# Patient Record
Sex: Female | Born: 1975 | Race: White | Hispanic: No | Marital: Married | Smoking: Never smoker
Health system: Southern US, Community
[De-identification: ages and names within clinical notes are randomized; demographics above are authoritative.]

## PROBLEM LIST (undated history)

## (undated) DIAGNOSIS — F419 Anxiety disorder, unspecified: Secondary | ICD-10-CM

## (undated) DIAGNOSIS — I10 Essential (primary) hypertension: Secondary | ICD-10-CM

## (undated) DIAGNOSIS — J45909 Unspecified asthma, uncomplicated: Secondary | ICD-10-CM

## (undated) DIAGNOSIS — M1990 Unspecified osteoarthritis, unspecified site: Secondary | ICD-10-CM

## (undated) DIAGNOSIS — F259 Schizoaffective disorder, unspecified: Secondary | ICD-10-CM

## (undated) DIAGNOSIS — F32A Depression, unspecified: Secondary | ICD-10-CM

## (undated) DIAGNOSIS — R011 Cardiac murmur, unspecified: Secondary | ICD-10-CM

## (undated) DIAGNOSIS — F4310 Post-traumatic stress disorder, unspecified: Secondary | ICD-10-CM

## (undated) DIAGNOSIS — M5430 Sciatica, unspecified side: Secondary | ICD-10-CM

## (undated) DIAGNOSIS — G932 Benign intracranial hypertension: Secondary | ICD-10-CM

## (undated) HISTORY — PX: ABDOMINAL HYSTERECTOMY: SHX81

## (undated) HISTORY — PX: TONSILLECTOMY: SUR1361

## (undated) HISTORY — PX: KNEE SURGERY: SHX244

## (undated) HISTORY — PX: APPENDECTOMY: SHX54

## (undated) HISTORY — PX: ABDOMINAL SURGERY: SHX537

## (undated) HISTORY — PX: TUBAL LIGATION: SHX77

## (undated) HISTORY — PX: CHOLECYSTECTOMY: SHX55

## (undated) HISTORY — PX: CARDIAC CATHETERIZATION: SHX172

---

## 2014-07-26 DIAGNOSIS — F419 Anxiety disorder, unspecified: Secondary | ICD-10-CM | POA: Diagnosis present

## 2014-09-11 DIAGNOSIS — G43719 Chronic migraine without aura, intractable, without status migrainosus: Secondary | ICD-10-CM | POA: Insufficient documentation

## 2014-09-11 DIAGNOSIS — J189 Pneumonia, unspecified organism: Secondary | ICD-10-CM | POA: Insufficient documentation

## 2014-09-11 DIAGNOSIS — M5440 Lumbago with sciatica, unspecified side: Secondary | ICD-10-CM | POA: Insufficient documentation

## 2014-09-11 DIAGNOSIS — M25569 Pain in unspecified knee: Secondary | ICD-10-CM | POA: Insufficient documentation

## 2014-09-11 DIAGNOSIS — G932 Benign intracranial hypertension: Secondary | ICD-10-CM | POA: Insufficient documentation

## 2014-09-11 DIAGNOSIS — L259 Unspecified contact dermatitis, unspecified cause: Secondary | ICD-10-CM | POA: Insufficient documentation

## 2014-12-25 DIAGNOSIS — G894 Chronic pain syndrome: Secondary | ICD-10-CM | POA: Diagnosis present

## 2015-12-04 DIAGNOSIS — R42 Dizziness and giddiness: Secondary | ICD-10-CM | POA: Insufficient documentation

## 2015-12-04 DIAGNOSIS — M436 Torticollis: Secondary | ICD-10-CM | POA: Insufficient documentation

## 2015-12-04 DIAGNOSIS — H53133 Sudden visual loss, bilateral: Secondary | ICD-10-CM | POA: Insufficient documentation

## 2015-12-04 DIAGNOSIS — R110 Nausea: Secondary | ICD-10-CM | POA: Insufficient documentation

## 2017-05-03 DIAGNOSIS — G4700 Insomnia, unspecified: Secondary | ICD-10-CM | POA: Insufficient documentation

## 2017-05-03 DIAGNOSIS — M62830 Muscle spasm of back: Secondary | ICD-10-CM | POA: Insufficient documentation

## 2017-05-03 DIAGNOSIS — J45909 Unspecified asthma, uncomplicated: Secondary | ICD-10-CM | POA: Insufficient documentation

## 2018-08-01 DIAGNOSIS — I10 Essential (primary) hypertension: Secondary | ICD-10-CM | POA: Diagnosis present

## 2018-08-01 DIAGNOSIS — F3341 Major depressive disorder, recurrent, in partial remission: Secondary | ICD-10-CM | POA: Insufficient documentation

## 2018-08-01 DIAGNOSIS — I319 Disease of pericardium, unspecified: Secondary | ICD-10-CM | POA: Insufficient documentation

## 2020-11-29 ENCOUNTER — Emergency Department (HOSPITAL_COMMUNITY): Payer: BC Managed Care – PPO

## 2020-11-29 ENCOUNTER — Encounter (HOSPITAL_COMMUNITY): Payer: Self-pay

## 2020-11-29 ENCOUNTER — Emergency Department (HOSPITAL_COMMUNITY)
Admission: EM | Admit: 2020-11-29 | Discharge: 2020-11-30 | Disposition: A | Discharge status: Home / Self Care | Payer: BC Managed Care – PPO | Attending: Emergency Medicine | Admitting: Emergency Medicine

## 2020-11-29 ENCOUNTER — Other Ambulatory Visit: Payer: Self-pay

## 2020-11-29 DIAGNOSIS — R112 Nausea with vomiting, unspecified: Secondary | ICD-10-CM | POA: Diagnosis not present

## 2020-11-29 DIAGNOSIS — R197 Diarrhea, unspecified: Secondary | ICD-10-CM | POA: Diagnosis not present

## 2020-11-29 DIAGNOSIS — G8929 Other chronic pain: Secondary | ICD-10-CM

## 2020-11-29 DIAGNOSIS — R509 Fever, unspecified: Secondary | ICD-10-CM | POA: Diagnosis not present

## 2020-11-29 DIAGNOSIS — R059 Cough, unspecified: Secondary | ICD-10-CM | POA: Insufficient documentation

## 2020-11-29 DIAGNOSIS — Z20822 Contact with and (suspected) exposure to covid-19: Secondary | ICD-10-CM | POA: Insufficient documentation

## 2020-11-29 DIAGNOSIS — R0789 Other chest pain: Secondary | ICD-10-CM | POA: Insufficient documentation

## 2020-11-29 DIAGNOSIS — M5442 Lumbago with sciatica, left side: Secondary | ICD-10-CM | POA: Diagnosis not present

## 2020-11-29 DIAGNOSIS — M5441 Lumbago with sciatica, right side: Secondary | ICD-10-CM | POA: Diagnosis not present

## 2020-11-29 DIAGNOSIS — R0602 Shortness of breath: Secondary | ICD-10-CM | POA: Insufficient documentation

## 2020-11-29 DIAGNOSIS — R350 Frequency of micturition: Secondary | ICD-10-CM | POA: Insufficient documentation

## 2020-11-29 DIAGNOSIS — I10 Essential (primary) hypertension: Secondary | ICD-10-CM | POA: Insufficient documentation

## 2020-11-29 DIAGNOSIS — R000 Tachycardia, unspecified: Secondary | ICD-10-CM | POA: Insufficient documentation

## 2020-11-29 DIAGNOSIS — M545 Low back pain, unspecified: Secondary | ICD-10-CM | POA: Diagnosis present

## 2020-11-29 DIAGNOSIS — R6889 Other general symptoms and signs: Secondary | ICD-10-CM

## 2020-11-29 DIAGNOSIS — H538 Other visual disturbances: Secondary | ICD-10-CM | POA: Insufficient documentation

## 2020-11-29 DIAGNOSIS — R1310 Dysphagia, unspecified: Secondary | ICD-10-CM | POA: Insufficient documentation

## 2020-11-29 HISTORY — DX: Cardiac murmur, unspecified: R01.1

## 2020-11-29 HISTORY — DX: Benign intracranial hypertension: G93.2

## 2020-11-29 HISTORY — DX: Unspecified osteoarthritis, unspecified site: M19.90

## 2020-11-29 HISTORY — DX: Sciatica, unspecified side: M54.30

## 2020-11-29 LAB — RESP PANEL BY RT-PCR (FLU A&B, COVID) ARPGX2
Influenza A by PCR: NEGATIVE
Influenza B by PCR: NEGATIVE
SARS Coronavirus 2 by RT PCR: NEGATIVE

## 2020-11-29 MED ORDER — ONDANSETRON HCL 4 MG/2ML IJ SOLN
4.0000 mg | Freq: Once | INTRAMUSCULAR | Status: AC
  Administered 2020-11-30: 4 mg via INTRAVENOUS
  Filled 2020-11-29: qty 2

## 2020-11-29 MED ORDER — MORPHINE SULFATE (PF) 4 MG/ML IV SOLN
4.0000 mg | Freq: Once | INTRAVENOUS | Status: AC
  Administered 2020-11-30: 4 mg via INTRAVENOUS
  Filled 2020-11-29: qty 1

## 2020-11-29 NOTE — ED Provider Notes (Signed)
[facility]-EMERGENCY DEPT Provider Note   CSN: [id number] Arrival date & time: 11/29/20  1853     History Chief Complaint  Patient presents with   Blurred Vision   multiple complaints    Audrey [name] is a 45 y.o. female with a history of pseudotumor cerebri, with a history of degenerative disc disease, sciatica, heart murmur, and chronic pain syndrome on chronic with chronic opioid use who presents the emergency department by EMS with a chief complaint of shortness of breath.  The patient reports a 5-day history of worsening shortness of breath that has been constant, but waxes and wanes in intensity, accompanied by nonproductive cough, constant, chest pain that is pressure-like and radiates around her right ribs into her right back, neck, and right shoulder.  She also adds that she has been having a fever and chills for the last 3 to 4 days.  T-max 103.5.  She has been taking Tylenol at home, last dose at 22:00.   She also adds that for the last 3 days that she has developed dysphagia.  Reports that she is having choking episodes with almost any solid foods.  She can minimally tolerate liquids.  She denies having a sore throat or odynophagia.  In spite of this, she adds that she has also been having urinary frequency, nausea, vomiting, and diarrhea over the last few days.  She also adds that her vision has been blurry over the last few days, right eye greater than left.  No diplopia, amaurosis fugax.   She also adds that she has been having increasing low back pain.  States that this has been worsening over the last month.  She reports pain radiating down her bilateral legs.  She states that she cannot feel her bilateral feet.  Over the last month she has had increasing episodes of urinary incontinence and has had to start wearing a diaper.  She denies fecal incontinence, saddle paresthesias.  She also feels as if her legs are more weak and have started giving out on  her.  She has had multiple falls over the last few days, but denies hitting her head or having a loss of consciousness.  She states that in addition to the worsening shortness of breath and chest pain, that the increasing falls is also what prompted her visit to the ED today.  She was followed by a pain specialist in [place] and has an appointment with pain management and [place], but the appointment is not for several weeks.  Unfortunately, she ran out of her home morphine 4 days ago.  She also reports that her daily medications include tizanidine and Valium.  States that she was only given a 1 month supply prior to moving to [place].  She denies rash, abdominal pain, neck stiffness, headache muffled voice, ear pain, leg swelling, hematuria, vaginal bleeding, discharge, or pain, rectal pain, constipation, palpitations.  States that she has a history of 3 natural back fusions.  No history of surgery to the lumbar spine.  She does have a history of back injections, but last injection was more than 3 years ago.  Surgical history includes tubal ligation, hysterectomy, appendectomy, cholecystectomy.  She also adds that she has a history of cutting, which is chronic.  No personal history of cancer.  She denies alcohol use, tobacco use, and all illicit or recreational substance use.  The history is provided by the patient and medical records. No language interpreter was used.  Past Medical History:  Diagnosis Date   Arthritis    Heart murmur    Pseudotumor cerebri    Sciatica     There are no problems to display for this patient.   Past Surgical History:  Procedure Laterality Date   ABDOMINAL HYSTERECTOMY     ABDOMINAL SURGERY     APPENDECTOMY     CARDIAC CATHETERIZATION     CHOLECYSTECTOMY     KNEE SURGERY     TONSILLECTOMY     TUBAL LIGATION       OB History   No obstetric history on file.     History reviewed. No pertinent family history.  Social History    Tobacco Use   Smoking status: Never   Smokeless tobacco: Never  Vaping Use   Vaping Use: Never used  Substance Use Topics   Alcohol use: Never   Drug use: Never    Home Medications Prior to Admission medications   Medication Sig Start Date End Date Taking? Authorizing Provider  diazepam (VALIUM) 5 MG tablet Take 1 tablet (5 mg total) by mouth every 12 (twelve) hours as needed for up to 3 days for anxiety. 11/30/20 12/03/20 Yes Tyger Oka A, PA-C  Lidocaine (HM LIDOCAINE PATCH) 4 % PTCH Apply 1 patch topically daily. 11/30/20  Yes Ayanni Tun A, PA-C  tiZANidine (ZANAFLEX) 4 MG tablet Take 1 tablet (4 mg total) by mouth every 12 (twelve) hours as needed for muscle spasms. 11/30/20  Yes Patrisha Hausmann A, PA-C    Allergies    Compazine [prochlorperazine], Nitroglycerin er, and Penicillins  Review of Systems   Review of Systems  Constitutional:  Positive for chills and fever. Negative for activity change.  HENT:  Positive for voice change. Negative for congestion, rhinorrhea, sneezing, sore throat and tinnitus.   Eyes:  Positive for visual disturbance (bilateral blurred vision R>L).  Respiratory:  Positive for cough, choking and shortness of breath.   Cardiovascular:  Positive for chest pain. Negative for palpitations.  Gastrointestinal:  Positive for abdominal pain, diarrhea, nausea and vomiting. Negative for constipation.  Endocrine: Positive for polyuria.  Genitourinary:  Positive for frequency. Negative for dysuria, flank pain, vaginal bleeding, vaginal discharge and vaginal pain.  Musculoskeletal:  Positive for arthralgias, back pain, gait problem and myalgias. Negative for neck pain and neck stiffness.  Skin:  Negative for color change, rash and wound.  Allergic/Immunologic: Negative for immunocompromised state.  Neurological:  Positive for weakness and numbness. Negative for syncope, light-headedness and headaches.  Psychiatric/Behavioral:  Negative for confusion.    Physical  Exam Updated Vital Signs BP (!) 170/91 (BP Location: Left Arm)   Pulse (!) 107   Temp 98 F (36.7 C) (Oral)   Resp (!) 25   Ht 5\' 3"  (1.6 m)   Wt 124.7 kg   SpO2 98%   BMI 48.71 kg/m   Physical Exam Vitals and nursing note reviewed.  Constitutional:      General: She is not in acute distress.    Appearance: She is obese. She is not ill-appearing, toxic-appearing or diaphoretic.  HENT:     Head: Normocephalic.     Mouth/Throat:     Comments: Uvula is midline.  Phonation is normal. Eyes:     Conjunctiva/sclera: Conjunctivae normal.  Neck:     Comments: No swelling noted to the neck.  No meningismus. Cardiovascular:     Rate and Rhythm: Normal rate and regular rhythm.     Heart sounds: No murmur heard.   No  friction rub. No gallop.  Pulmonary:     Effort: Pulmonary effort is normal. No respiratory distress.     Breath sounds: No stridor. No wheezing, rhonchi or rales.     Comments: Lungs are clear Tatian bilaterally.  No increased work of breathing.  Patient is able to speak in complete, fluent sentences. Chest:     Chest wall: No tenderness.  Abdominal:     General: There is no distension.     Palpations: Abdomen is soft. There is no mass.     Tenderness: There is abdominal tenderness. There is no right CVA tenderness, left CVA tenderness, guarding or rebound.     Hernia: No hernia is present.     Comments: Minimal tenderness to palpation in the bilateral lower abdomen without rebound or guarding.   Musculoskeletal:     Cervical back: Neck supple.     Right lower leg: No edema.     Left lower leg: No edema.     Comments: Tender to palpation to the midline spinous processes of the lumbar spine.  No crepitus or step-offs.  There is bilateral paraspinal muscle tenderness.  Negative straight leg raise bilaterally.  Good strength against resistance of the bilateral lower extremities.  Patient is able to lift her legs independently off the bed with minimal difficulty.  Sensation  is intact and equal to the bilateral lower extremities.  DP and PT pulses are 2+ and symmetric.  Skin:    General: Skin is warm.     Findings: No rash.  Neurological:     Mental Status: She is alert.  Psychiatric:        Behavior: Behavior normal.    ED Results / Procedures / Treatments   Labs (all labs ordered are listed, but only abnormal results are displayed) Labs Reviewed  COMPREHENSIVE METABOLIC PANEL - Abnormal; Notable for the following components:      Result Value   Calcium 8.7 (*)    Total Protein 6.1 (*)    Albumin 3.3 (*)    All other components within normal limits  RESP PANEL BY RT-PCR (FLU A&B, COVID) ARPGX2  GROUP A STREP BY PCR  CBC WITH DIFFERENTIAL/PLATELET  LIPASE, BLOOD  LACTIC ACID, PLASMA  URINALYSIS, ROUTINE W REFLEX MICROSCOPIC  BRAIN NATRIURETIC PEPTIDE  TROPONIN I (HIGH SENSITIVITY)    EKG EKG Interpretation  Date/Time:  Friday November 29 2020 19:08:06 EDT Ventricular Rate:  107 PR Interval:  161 QRS Duration: 94 QT Interval:  360 QTC Calculation: 481 R Axis:   94 Text Interpretation: Sinus tachycardia Borderline right axis deviation Low voltage, precordial leads Abnormal T, consider ischemia, anterior leads No old tracing to compare Confirmed by Melene Plan [phone]) on 11/29/2020 10:41:22 PM  Radiology DG Chest 2 View  Result Date: 11/29/2020 CLINICAL DATA:  Shortness of breath EXAM: CHEST - 2 VIEW COMPARISON:  None. FINDINGS: Borderline enlarged cardiac silhouette. There is no focal airspace disease. There is no large pleural effusion or visible pneumothorax. Incidental azygous fissure, normal variant. There is no acute osseous abnormality. IMPRESSION: Borderline enlarged cardiac silhouette.  No focal airspace disease. Electronically Signed   By: Caprice Renshaw M.D.   On: 11/29/2020 20:02    Procedures Procedures   Medications Ordered in ED Medications  morphine 4 MG/ML injection 4 mg (4 mg Intravenous Given 11/30/20 0130)  ondansetron  (ZOFRAN) injection 4 mg (4 mg Intravenous Given 11/30/20 0129)    ED Course  I have reviewed the triage vital signs and the nursing  notes.  Pertinent labs & imaging results that were available during my care of the patient were reviewed by me and considered in my medical decision making (see chart for details).  11/08/2020 11/07/2020 2  Diazepam 5 Mg Tablet 30.00 30 Bi Kol [id number] Pub (2387) 0/0 0.50 LME [place] Ins [place] 11/03/2020 11/02/2020 2  Morphine Sulfate Ir 15 Mg Tab 2.00 1 Malena Edman [id number] Pub (2387) 0/0 30.00 [place]ME [place] 10/07/2020 10/03/2020 2  Alprazolam 2 Mg Tablet 90.00 30 La Rob [id number] Pub (2387) 0/0 12.00 [place] 07/06/2020 06/26/2020 2  Alprazolam 2 Mg Tablet 90.00 30 La Rob [id number] Pub (2387) 0/0 12.00 [place] 02/26/2020 02/26/2020 2  Alprazolam 2 Mg Tablet 90.00 30 La Rob [id number] Pub (2387) 0/0 12.00 [place] 08/08/2019 08/02/2019 1  Buprenorphine 5 Mcg/hr Patch 4.00 28 Be Tor [id number] Pub (8072) 0/0 0.12 mg Comm Ins [facility] 02/26/2019 02/26/2019 1  Diazepam 10 Mg Tablet 21.00 10 Yi Ros [id number] Pub (9660) 0/0 2.10 LME [place]    MDM Rules/Calculators/A&P                            45 year old female with a history of pseudotumor cerebri, with a history of degenerative disc disease, sciatica, heart murmur, and chronic pain syndrome on chronic with chronic opioid use who presents to the emergency department with multiple complaints as mentioned above.  Mild tachycardia on arrival.  Mild hypertension, but she has no tachypnea or hypoxia.  Labs and imaging of been reviewed and independently interpreted by me.  Patient expressed concerns for worsening shortness of breath and chest pain.  Troponin is not elevated.  Chest x-ray is unremarkable.  EKG with mild sinus tachycardia.  BNP is not elevated.  Although she does have mild sinus tachycardia, no other risk factors for PE.  I did consider that this could be from withdrawal as she does report  that she ran out of her home morphine and Valium 4 days ago.  However, as the remainder of her work-up continued, I have a low suspicion for benzodiazepine or opioid withdrawal at this time.  She has no metabolic derangements.  Urinalysis is not concerning for infection.  Labs are otherwise overall reassuring.  She did express concern for acute on chronic low back pain.  Unfortunately, I am not able to review her medical records from [place] and care everywhere at this time.  On exam, she has no focal neurologic deficits.  She has a negative straight leg raise bilaterally.  She does not have any red flags other than urinary incontinence, which has been ongoing for weeks, per the patient, she has been ambulatory and was ambulated independently in the department on pulse ox and had no hypoxia.  Upon further review of the patient's chart, I reviewed the [place]ist[place] controlled substance database.  I pulled her records from [place], which are listed above.  Of note, the patient does not have any prescriptions for chronic morphine.  She was prescribed a 2 pill course previously.  She does however appear to take Valium regularly.  Given concern for abrupt discontinuation of benzodiazepine, I did have a very frank conversation with the patient's about discharging her with a short course of a controlled substance.  She is scheduled to be seen by pain management in 1 week, and I have discussed that this is a one-time prescription from the emergency department.  I did  consider ordering repeat imaging of her lumbar spine since we do not have images available at this time, but given her overall physical exam with no neurologic deficits, her ability to ambulate independently, and the fact that she has remained stable after being observed for almost 10 hours in the emergency department, I feel that no further urgent or emergent work-up is indicated.  Doubt cauda equina, transverse myelitis, epidural abscess.  At this time, the  patient is hemodynamically stable in no acute distress.  Urged the patient to get established with a primary care provider for follow-up. Final Clinical Impression(s) / ED Diagnoses Final diagnoses:  Chronic bilateral low back pain with bilateral sciatica  Multiple complaints    Rx / DC Orders ED Discharge Orders          Ordered    diazepam (VALIUM) 5 MG tablet  Every 12 hours PRN        11/30/20 0424    tiZANidine (ZANAFLEX) 4 MG tablet  Every 12 hours PRN        11/30/20 0424    Lidocaine (HM LIDOCAINE PATCH) 4 % PTCH  Every 24 hours        11/30/20 0424             Icy Fuhrmann A, PA-C 11/30/20 0523    Molpus, John, MD 11/30/20 1203

## 2020-11-29 NOTE — ED Triage Notes (Signed)
Per EMS, patient walked to [facility] and EMS was called from there. C/o SOB x4 days. Recent move from [place]. Endorses N/V and fever. Reports taking tylenol with relief.

## 2020-11-29 NOTE — ED Triage Notes (Signed)
Patient added that she has trouble swallowing and blurred vision that started today.  Also noted that the patient has superficial cuts to the left hand and left lower forearm area. Patient states she is a cutter and did this 4-5 months ago.

## 2020-11-29 NOTE — ED Provider Notes (Signed)
Emergency Medicine Provider Triage Evaluation Note  Audrey [name] , a 45 y.o. female  was evaluated in triage.  Pt complains of short of breath for 4 days.  Pt has multiple complaints    Review of Systems  Positive: Fever Negative:   Physical Exam  BP (!) 158/119 (BP Location: Left Arm)   Pulse (!) 114   Temp 98 F (36.7 C) (Oral)   Resp 17   Ht 5\' 3"  (1.6 m)   Wt 124.7 kg   SpO2 98%   BMI 48.71 kg/m  Gen:   Awake, no distress   Resp:  Normal effort  MSK:   Moves extremities without difficulty  Other:    Medical Decision Making  Medically screening exam initiated at 7:24 PM.  Appropriate orders placed.  Audrey [name] was informed that the remainder of the evaluation will be completed by another provider, this initial triage assessment does not replace that evaluation, and the importance of remaining in the ED until their evaluation is complete.     Pearlean Brownie, PA-C 11/29/20 1925    01/29/21, DO 11/29/20 2258

## 2020-11-30 LAB — URINALYSIS, ROUTINE W REFLEX MICROSCOPIC
Bilirubin Urine: NEGATIVE
Glucose, UA: NEGATIVE mg/dL
Hgb urine dipstick: NEGATIVE
Ketones, ur: NEGATIVE mg/dL
Leukocytes,Ua: NEGATIVE
Nitrite: NEGATIVE
Protein, ur: NEGATIVE mg/dL
Specific Gravity, Urine: 1.01 (ref 1.005–1.030)
pH: 8 (ref 5.0–8.0)

## 2020-11-30 LAB — COMPREHENSIVE METABOLIC PANEL
ALT: 19 U/L (ref 0–44)
AST: 28 U/L (ref 15–41)
Albumin: 3.3 g/dL — ABNORMAL LOW (ref 3.5–5.0)
Alkaline Phosphatase: 109 U/L (ref 38–126)
Anion gap: 5 (ref 5–15)
BUN: 7 mg/dL (ref 6–20)
CO2: 27 mmol/L (ref 22–32)
Calcium: 8.7 mg/dL — ABNORMAL LOW (ref 8.9–10.3)
Chloride: 108 mmol/L (ref 98–111)
Creatinine, Ser: 0.72 mg/dL (ref 0.44–1.00)
GFR, Estimated: >60 mL/min (ref >60)
Glucose, Bld: 94 mg/dL (ref 70–99)
Potassium: 4 mmol/L (ref 3.5–5.1)
Sodium: 140 mmol/L (ref 135–145)
Total Bilirubin: 0.8 mg/dL (ref 0.3–1.2)
Total Protein: 6.1 g/dL — ABNORMAL LOW (ref 6.5–8.1)

## 2020-11-30 LAB — TROPONIN I (HIGH SENSITIVITY): Troponin I (High Sensitivity): 3 ng/L (ref <18)

## 2020-11-30 LAB — GROUP A STREP BY PCR: Group A Strep by PCR: NOT DETECTED

## 2020-11-30 LAB — CBC WITH DIFFERENTIAL/PLATELET
Abs Immature Granulocytes: 0.05 10*3/uL (ref 0.00–0.07)
Basophils Absolute: 0.1 10*3/uL (ref 0.0–0.1)
Basophils Relative: 1 %
Eosinophils Absolute: 0.2 10*3/uL (ref 0.0–0.5)
Eosinophils Relative: 3 %
HCT: 39.2 % (ref 36.0–46.0)
Hemoglobin: 12.5 g/dL (ref 12.0–15.0)
Immature Granulocytes: 1 %
Lymphocytes Relative: 23 %
Lymphs Abs: 1.7 10*3/uL (ref 0.7–4.0)
MCH: 31.2 pg (ref 26.0–34.0)
MCHC: 31.9 g/dL (ref 30.0–36.0)
MCV: 97.8 fL (ref 80.0–100.0)
Monocytes Absolute: 0.4 10*3/uL (ref 0.1–1.0)
Monocytes Relative: 5 %
Neutro Abs: 5 10*3/uL (ref 1.7–7.7)
Neutrophils Relative %: 67 %
Platelets: 198 10*3/uL (ref 150–400)
RBC: 4.01 MIL/uL (ref 3.87–5.11)
RDW: 14.3 % (ref 11.5–15.5)
WBC: 7.5 10*3/uL (ref 4.0–10.5)
nRBC: 0 % (ref 0.0–0.2)

## 2020-11-30 LAB — LACTIC ACID, PLASMA: Lactic Acid, Venous: 1.1 mmol/L (ref 0.5–1.9)

## 2020-11-30 LAB — LIPASE, BLOOD: Lipase: 21 U/L (ref 11–51)

## 2020-11-30 LAB — BRAIN NATRIURETIC PEPTIDE: B Natriuretic Peptide: 45 pg/mL (ref 0.0–100.0)

## 2020-11-30 MED ORDER — DIAZEPAM 5 MG PO TABS: 5.0000 mg | ORAL_TABLET | Freq: Two times a day (BID) | ORAL | 0 refills | Status: AC | PRN

## 2020-11-30 MED ORDER — LIDOCAINE 4 % EX PTCH
1.0000 | MEDICATED_PATCH | CUTANEOUS | 0 refills | Status: DC
Start: 2020-11-30 — End: 2021-06-23

## 2020-11-30 MED ORDER — TIZANIDINE HCL 4 MG PO TABS: 4.0000 mg | ORAL_TABLET | Freq: Two times a day (BID) | ORAL | 0 refills | Status: DC | PRN

## 2020-11-30 NOTE — Discharge Instructions (Addendum)
Thank you for allowing me to care for you today in the Emergency Department.   Your work-up today was very reassuring.    Please use the number on your discharge paperwork to get established with a primary care provider.  I have provided you with a 3-day course of your home Valium.  Please know that this is a 1 time prescription from the emergency department and future prescriptions will have to be obtained from primary care through pain management.  Return to the emergency department if you become unable to walk or have other new, concerning symptoms

## 2020-11-30 NOTE — ED Notes (Signed)
RN attempted X2 to start IV on pt to draw blood and administer medications. IV team consult placed.

## 2020-11-30 NOTE — ED Notes (Signed)
Called lab for troponin results. Lab said they are not finished running test.

## 2020-12-02 ENCOUNTER — Emergency Department (HOSPITAL_BASED_OUTPATIENT_CLINIC_OR_DEPARTMENT_OTHER): Payer: BC Managed Care – PPO

## 2020-12-02 ENCOUNTER — Encounter (HOSPITAL_BASED_OUTPATIENT_CLINIC_OR_DEPARTMENT_OTHER): Payer: Self-pay | Admitting: Emergency Medicine

## 2020-12-02 ENCOUNTER — Emergency Department (HOSPITAL_BASED_OUTPATIENT_CLINIC_OR_DEPARTMENT_OTHER)
Admission: EM | Admit: 2020-12-02 | Discharge: 2020-12-02 | Disposition: A | Discharge status: Home / Self Care | Payer: BC Managed Care – PPO | Attending: Emergency Medicine | Admitting: Emergency Medicine

## 2020-12-02 ENCOUNTER — Other Ambulatory Visit: Payer: Self-pay

## 2020-12-02 DIAGNOSIS — R079 Chest pain, unspecified: Secondary | ICD-10-CM | POA: Insufficient documentation

## 2020-12-02 DIAGNOSIS — R1010 Upper abdominal pain, unspecified: Secondary | ICD-10-CM | POA: Diagnosis not present

## 2020-12-02 DIAGNOSIS — R110 Nausea: Secondary | ICD-10-CM | POA: Diagnosis not present

## 2020-12-02 DIAGNOSIS — R000 Tachycardia, unspecified: Secondary | ICD-10-CM | POA: Diagnosis not present

## 2020-12-02 DIAGNOSIS — K0889 Other specified disorders of teeth and supporting structures: Secondary | ICD-10-CM | POA: Diagnosis present

## 2020-12-02 DIAGNOSIS — R519 Headache, unspecified: Secondary | ICD-10-CM | POA: Diagnosis not present

## 2020-12-02 LAB — CBC WITH DIFFERENTIAL/PLATELET
Abs Immature Granulocytes: 0.07 10*3/uL (ref 0.00–0.07)
Basophils Absolute: 0 10*3/uL (ref 0.0–0.1)
Basophils Relative: 0 %
Eosinophils Absolute: 0.1 10*3/uL (ref 0.0–0.5)
Eosinophils Relative: 1 %
HCT: 41.4 % (ref 36.0–46.0)
Hemoglobin: 13.2 g/dL (ref 12.0–15.0)
Immature Granulocytes: 1 %
Lymphocytes Relative: 23 %
Lymphs Abs: 2.3 10*3/uL (ref 0.7–4.0)
MCH: 30.7 pg (ref 26.0–34.0)
MCHC: 31.9 g/dL (ref 30.0–36.0)
MCV: 96.3 fL (ref 80.0–100.0)
Monocytes Absolute: 0.7 10*3/uL (ref 0.1–1.0)
Monocytes Relative: 7 %
Neutro Abs: 6.9 10*3/uL (ref 1.7–7.7)
Neutrophils Relative %: 68 %
Platelets: 210 10*3/uL (ref 150–400)
RBC: 4.3 MIL/uL (ref 3.87–5.11)
RDW: 14.4 % (ref 11.5–15.5)
WBC: 10 10*3/uL (ref 4.0–10.5)
nRBC: 0 % (ref 0.0–0.2)

## 2020-12-02 LAB — COMPREHENSIVE METABOLIC PANEL
ALT: 37 U/L (ref 0–44)
AST: 52 U/L — ABNORMAL HIGH (ref 15–41)
Albumin: 3.5 g/dL (ref 3.5–5.0)
Alkaline Phosphatase: 113 U/L (ref 38–126)
Anion gap: 9 (ref 5–15)
BUN: 7 mg/dL (ref 6–20)
CO2: 29 mmol/L (ref 22–32)
Calcium: 8.6 mg/dL — ABNORMAL LOW (ref 8.9–10.3)
Chloride: 103 mmol/L (ref 98–111)
Creatinine, Ser: 0.75 mg/dL (ref 0.44–1.00)
GFR, Estimated: >60 mL/min (ref >60)
Glucose, Bld: 110 mg/dL — ABNORMAL HIGH (ref 70–99)
Potassium: 3.2 mmol/L — ABNORMAL LOW (ref 3.5–5.1)
Sodium: 141 mmol/L (ref 135–145)
Total Bilirubin: 0.5 mg/dL (ref 0.3–1.2)
Total Protein: 6.7 g/dL (ref 6.5–8.1)

## 2020-12-02 LAB — TROPONIN I (HIGH SENSITIVITY): Troponin I (High Sensitivity): 3 ng/L (ref <18)

## 2020-12-02 LAB — LIPASE, BLOOD: Lipase: 11 U/L (ref 11–51)

## 2020-12-02 MED ORDER — IOHEXOL 350 MG/ML SOLN
75.0000 mL | Freq: Once | INTRAVENOUS | Status: AC | PRN
  Administered 2020-12-02: 75 mL via INTRAVENOUS

## 2020-12-02 MED ORDER — ALUM & MAG HYDROXIDE-SIMETH 200-200-20 MG/5ML PO SUSP
30.0000 mL | Freq: Once | ORAL | Status: AC
  Administered 2020-12-02: 30 mL via ORAL
  Filled 2020-12-02: qty 30

## 2020-12-02 MED ORDER — POTASSIUM CHLORIDE CRYS ER 20 MEQ PO TBCR
40.0000 meq | EXTENDED_RELEASE_TABLET | Freq: Once | ORAL | Status: AC
  Administered 2020-12-02: 40 meq via ORAL
  Filled 2020-12-02: qty 2

## 2020-12-02 MED ORDER — ACETAMINOPHEN 325 MG PO TABS
650.0000 mg | ORAL_TABLET | Freq: Once | ORAL | Status: AC
  Administered 2020-12-02: 650 mg via ORAL
  Filled 2020-12-02: qty 2

## 2020-12-02 MED ORDER — POTASSIUM CHLORIDE CRYS ER 20 MEQ PO TBCR: 30.0000 meq | EXTENDED_RELEASE_TABLET | Freq: Once | ORAL | Status: DC

## 2020-12-02 MED ORDER — LIDOCAINE VISCOUS HCL 2 % MT SOLN
15.0000 mL | Freq: Once | OROMUCOSAL | Status: AC
  Administered 2020-12-02: 15 mL via ORAL
  Filled 2020-12-02: qty 15

## 2020-12-02 MED ORDER — AMOXICILLIN 500 MG PO CAPS: 500.0000 mg | ORAL_CAPSULE | Freq: Two times a day (BID) | ORAL | 0 refills | Status: AC

## 2020-12-02 NOTE — ED Triage Notes (Signed)
Pt arrives pov with report that she is having chest burning, dx with pneumonia x 2 days ago, taking prednisone, now reports facial swelling this am. Pt endorses pain with deep inspiration

## 2020-12-02 NOTE — Discharge Instructions (Addendum)
I prescribed you an antibiotic called amoxicillin.  Please make sure you complete the full course even if you feel that your symptoms have improved.  If you develop any new or worsening symptoms please come back to the emergency department.  It was a pleasure to meet you.

## 2020-12-02 NOTE — ED Provider Notes (Signed)
[facility] EMERGENCY DEPT Provider Note   CSN: [id number] Arrival date & time: 12/02/20  1612     History No chief complaint on file.   Audrey [name] is a 45 y.o. female.  HPI Patient is a 45 year old female with a medical history as noted below.  She presents to the emergency department today with multiple complaints.  Patient states earlier today she began experiencing mild right-sided facial swelling as well as pain along the right upper portion of her mouth.  Patient also notes burning central chest pain that worsens with deep breathing.  She states the pain starts along the upper abdomen and radiates up through her chest.  Reports associated nausea without vomiting or diarrhea.    Past Medical History:  Diagnosis Date   Arthritis    Heart murmur    Pseudotumor cerebri    Sciatica     There are no problems to display for this patient.   Past Surgical History:  Procedure Laterality Date   ABDOMINAL HYSTERECTOMY     ABDOMINAL SURGERY     APPENDECTOMY     CARDIAC CATHETERIZATION     CHOLECYSTECTOMY     KNEE SURGERY     TONSILLECTOMY     TUBAL LIGATION       OB History   No obstetric history on file.     History reviewed. No pertinent family history.  Social History   Tobacco Use   Smoking status: Never   Smokeless tobacco: Never  Vaping Use   Vaping Use: Never used  Substance Use Topics   Alcohol use: Never   Drug use: Never    Home Medications Prior to Admission medications   Medication Sig Start Date End Date Taking? Authorizing Provider  amoxicillin (AMOXIL) 500 MG capsule Take 1 capsule (500 mg total) by mouth 2 (two) times daily for 10 days. 12/02/20 12/12/20 Yes Rheya Minogue, PA-C  diazepam (VALIUM) 5 MG tablet Take 1 tablet (5 mg total) by mouth every 12 (twelve) hours as needed for up to 3 days for anxiety. 11/30/20 12/03/20  McDonald, Mia A, PA-C  Lidocaine (HM LIDOCAINE PATCH) 4 % PTCH Apply 1 patch topically daily. 11/30/20    McDonald, Mia A, PA-C  tiZANidine (ZANAFLEX) 4 MG tablet Take 1 tablet (4 mg total) by mouth every 12 (twelve) hours as needed for muscle spasms. 11/30/20   McDonald, Mia A, PA-C    Allergies    Compazine [prochlorperazine], Nitroglycerin er, and Penicillins  Review of Systems   Review of Systems  All other systems reviewed and are negative. Ten systems reviewed and are negative for acute change, except as noted in the HPI.   Physical Exam Updated Vital Signs BP 123/66 (BP Location: Right Arm)   Pulse (!) 106   Temp 98.2 F (36.8 C) (Oral)   Resp 13   SpO2 95%   Physical Exam Vitals and nursing note reviewed.  Constitutional:      General: She is not in acute distress.    Appearance: Normal appearance. She is obese. She is not ill-appearing, toxic-appearing or diaphoretic.  HENT:     Head: Normocephalic and atraumatic.     Right Ear: External ear normal.     Left Ear: External ear normal.     Nose: Nose normal.     Mouth/Throat:     Mouth: Mucous membranes are moist.     Pharynx: Oropharynx is clear. No oropharyngeal exudate or posterior oropharyngeal erythema.     Comments: Uvula midline.  Readily handling secretions.  No significant erythema noted in the posterior oropharynx.  Poor dentition with diffuse caries as well as multiple broken teeth.  Patient has mild tenderness along the gingiva just superior to the right upper first molar which is missing.  No erythema or palpable fluctuance noted in the region.  No drainage. Eyes:     Extraocular Movements: Extraocular movements intact.  Cardiovascular:     Rate and Rhythm: Regular rhythm. Tachycardia present.     Pulses: Normal pulses.     Heart sounds: Normal heart sounds. No murmur heard.   No friction rub. No gallop.  Pulmonary:     Effort: Pulmonary effort is normal. No respiratory distress.     Breath sounds: Normal breath sounds. No stridor. No wheezing, rhonchi or rales.  Abdominal:     General: Abdomen is flat.      Palpations: Abdomen is soft.     Tenderness: There is abdominal tenderness.     Comments: Protuberant abdomen that is difficult to assess due to body habitus.  Mild tenderness appreciated along the epigastrium.  Musculoskeletal:        General: Normal range of motion.     Cervical back: Normal range of motion and neck supple. No tenderness.     Right lower leg: No edema.     Left lower leg: No edema.     Comments: No pedal edema appreciated.  No tenderness noted along the calves.  No palpable cords.  Skin:    General: Skin is warm and dry.  Neurological:     General: No focal deficit present.     Mental Status: She is alert and oriented to person, place, and time.  Psychiatric:        Mood and Affect: Mood normal.        Behavior: Behavior normal.    ED Results / Procedures / Treatments   Labs (all labs ordered are listed, but only abnormal results are displayed) Labs Reviewed  COMPREHENSIVE METABOLIC PANEL - Abnormal; Notable for the following components:      Result Value   Potassium 3.2 (*)    Glucose, Bld 110 (*)    Calcium 8.6 (*)    AST 52 (*)    All other components within normal limits  CBC WITH DIFFERENTIAL/PLATELET  LIPASE, BLOOD  TROPONIN I (HIGH SENSITIVITY)  TROPONIN I (HIGH SENSITIVITY)    EKG EKG Interpretation  Date/Time:  Monday December 02 2020 16:27:25 EDT Ventricular Rate:  109 PR Interval:  165 QRS Duration: 90 QT Interval:  368 QTC Calculation: 496 R Axis:   61 Text Interpretation: Sinus tachycardia Low voltage, precordial leads Consider inferior infarct No significant change since last tracing Confirmed by Meridee Score [phone]) on 12/02/2020 4:45:50 PM  Radiology CT HEAD WO CONTRAST ( )  Result Date: 12/02/2020 CLINICAL DATA:  Headache EXAM: CT HEAD WITHOUT CONTRAST TECHNIQUE: Contiguous axial images were obtained from the base of the skull through the vertex without intravenous contrast. COMPARISON:  None. FINDINGS: Brain: No evidence of  acute infarction, hemorrhage, hydrocephalus, extra-axial collection or mass lesion/mass effect. Vascular: No hyperdense vessel or unexpected calcification. Skull: Normal. Negative for fracture or focal lesion. Sinuses/Orbits: No acute finding. Moderate mucosal thickening in the right maxillary sinus Other: None IMPRESSION: 1. Negative non contrasted CT appearance of the brain 2. Sinus disease Electronically Signed   By: Jasmine Pang M.D.   On: 12/02/2020 19:41   CT Angio Chest PE W and/or Wo Contrast  Result Date: 12/02/2020 CLINICAL  DATA:  Chest pain with tachycardia EXAM: CT ANGIOGRAPHY CHEST WITH CONTRAST TECHNIQUE: Multidetector CT imaging of the chest was performed using the standard protocol during bolus administration of intravenous contrast. Multiplanar CT image reconstructions and MIPs were obtained to evaluate the vascular anatomy. CONTRAST:  75mL OMNIPAQUE IOHEXOL 350 MG/ML SOLN COMPARISON:  None. FINDINGS: Cardiovascular: Assessment for distal PE limited secondary to suboptimal opacification of subsegmental vessels. No acute central filling defect is seen. Cardiomegaly. No pericardial effusion. Nonaneurysmal aorta Mediastinum/Nodes: Midline trachea. No thyroid mass. No suspicious nodes. Esophagus within normal limits. Lungs/Pleura: Hazy pulmonary densities bilaterally. No pleural effusion or pneumothorax Upper Abdomen: No acute abnormality. Musculoskeletal: No chest wall abnormality. No acute or significant osseous findings. Review of the MIP images confirms the above findings. IMPRESSION: 1. Limited assessment for distal PE due to suboptimal opacification of subsegmental vessels. No acute central embolus is seen 2. Cardiomegaly. Scattered bilateral hazy pulmonary densities which could be due to atelectasis, small airways disease, or minimal edema Electronically Signed   By: Jasmine PangKim  Fujinaga M.D.   On: 12/02/2020 19:48   DG Chest Portable 1 View  Result Date: 12/02/2020 CLINICAL DATA:  Chest pain.   Recent diagnosis of pneumonia. EXAM: PORTABLE CHEST 1 VIEW COMPARISON:  Frontal and lateral views 11/29/2020 FINDINGS: Lower lung volumes from prior exam. Stable borderline heart size. Unchanged mediastinal contours. No focal airspace disease or pleural effusion. No pneumothorax. Incidental azygos fissure again seen. No acute osseous abnormalities IMPRESSION: Low lung volumes without acute abnormality. Stable borderline cardiomegaly over the last 3 days. Electronically Signed   By: Narda RutherfordMelanie  Sanford M.D.   On: 12/02/2020 17:28    Procedures Procedures   Medications Ordered in ED Medications  potassium chloride SA (KLOR-CON) CR tablet 40 mEq (has no administration in time range)  acetaminophen (TYLENOL) tablet 650 mg (650 mg Oral Given 12/02/20 1659)  alum & mag hydroxide-simeth (MAALOX/MYLANTA) 200-200-20 MG/5ML suspension 30 mL (30 mLs Oral Given 12/02/20 1659)    And  lidocaine (XYLOCAINE) 2 % viscous mouth solution 15 mL (15 mLs Oral Given 12/02/20 1659)  iohexol (OMNIPAQUE) 350 MG/ML injection 75 mL (75 mLs Intravenous Contrast Given 12/02/20 1935)    ED Course  I have reviewed the triage vital signs and the nursing notes.  Pertinent labs & imaging results that were available during my care of the patient were reviewed by me and considered in my medical decision making (see chart for details).  Clinical Course as of 12/02/20 2020  Mon Dec 02, 2020  [id number] Initial lab work is reassuring.  Chest x-ray is negative.  Patient persistently tachycardic.  She [place] ago.  Although she has no unilateral leg swelling or calf pain she has endorsing pleuritic chest pain as well as shortness of breath.  We will obtain a CT PE study.  [LJ]    Clinical Course User Index [LJ] Placido SouJoldersma, Ladanian Kelter, PA-C   MDM Rules/Calculators/A&P                          Pt is a 45 y.o. female who presents to the emergency department due to right upper dental pain,  facial swelling, chest pain, shortness of breath  Labs: CBC without abnormalities. CMP with a potassium of 3.2, glucose of 110, calcium of 8.6, AST of 52. Troponin of 3. Lipase of 11.  Imaging: Chest x-ray shows low lung volumes without acute abnormalities.  Stable borderline cardiomegaly over  the last 3 days. CT scan of the head without contrast shows a negative noncontrasted CT appearance of the brain.  Sinus disease. CT of the chest shows limited assessment for a distal PE due to suboptimal opacification of the subsegmental vessels.  There is no acute central embolus seen.  Cardiomegaly.  Scattered bilateral hazy pulmonary densities which could be due to atelectasis, small airway disease or minimal edema.  I, Placido Sou, PA-C, personally reviewed and evaluated these images and lab results as part of my medical decision-making.  Unsure the source of patient's chest pain/shortness of breath.  She does note mild relief with a GI cocktail but no significant relief.  Patient had a troponin of 3, reassuring chest x-ray, as well as an ECG without significant changes since last tracing.  Doubt ACS.  Given patient's recent travel to [place] as well as persistent tachycardia along with her chest pain/shortness of breath I obtained a CTA of the chest which was negative for PE as well.  Patient also reporting some mild facial swelling as well as right upper dental pain.  Please see physical exam findings as noted above.  She does note a penicillin allergy but states that she can tolerate amoxicillin so will discharge on a course of amoxicillin.  Patient also found to be hypokalemic at this visit at 3.2.  She was given 40 mEq of Klor-Con for this.  Feel that the patient is stable for discharge at this time and she is agreeable.  Discussed return precautions.  Her questions were answered and she was amicable the time of discharge.  Note: Portions of this report may have been transcribed using  voice recognition software. Every effort was made to ensure accuracy; however, inadvertent computerized transcription errors may be present.   Final Clinical Impression(s) / ED Diagnoses Final diagnoses:  Pain, dental  Chest pain, unspecified type    Rx / DC Orders ED Discharge Orders          Ordered    amoxicillin (AMOXIL) 500 MG capsule  2 times daily        12/02/20 2008             Placido Sou, Cordelia Poche 12/02/20 2026    Terrilee Files, MD 12/02/20 2038

## 2020-12-10 DIAGNOSIS — R001 Bradycardia, unspecified: Secondary | ICD-10-CM | POA: Insufficient documentation

## 2020-12-10 DIAGNOSIS — R443 Hallucinations, unspecified: Secondary | ICD-10-CM | POA: Insufficient documentation

## 2020-12-10 DIAGNOSIS — M5136 Other intervertebral disc degeneration, lumbar region: Secondary | ICD-10-CM | POA: Insufficient documentation

## 2020-12-10 DIAGNOSIS — G43009 Migraine without aura, not intractable, without status migrainosus: Secondary | ICD-10-CM | POA: Insufficient documentation

## 2020-12-10 DIAGNOSIS — J4520 Mild intermittent asthma, uncomplicated: Secondary | ICD-10-CM | POA: Insufficient documentation

## 2020-12-10 DIAGNOSIS — F4310 Post-traumatic stress disorder, unspecified: Secondary | ICD-10-CM | POA: Diagnosis present

## 2020-12-10 DIAGNOSIS — M51369 Other intervertebral disc degeneration, lumbar region without mention of lumbar back pain or lower extremity pain: Secondary | ICD-10-CM

## 2020-12-10 DIAGNOSIS — Z9189 Other specified personal risk factors, not elsewhere classified: Secondary | ICD-10-CM | POA: Insufficient documentation

## 2020-12-10 DIAGNOSIS — R4589 Other symptoms and signs involving emotional state: Secondary | ICD-10-CM | POA: Insufficient documentation

## 2020-12-10 DIAGNOSIS — R011 Cardiac murmur, unspecified: Secondary | ICD-10-CM | POA: Insufficient documentation

## 2020-12-10 DIAGNOSIS — K279 Peptic ulcer, site unspecified, unspecified as acute or chronic, without hemorrhage or perforation: Secondary | ICD-10-CM | POA: Insufficient documentation

## 2020-12-10 HISTORY — DX: Other intervertebral disc degeneration, lumbar region without mention of lumbar back pain or lower extremity pain: M51.369

## 2020-12-10 HISTORY — DX: Other intervertebral disc degeneration, lumbar region: M51.36

## 2020-12-28 ENCOUNTER — Other Ambulatory Visit: Payer: Self-pay

## 2020-12-28 ENCOUNTER — Telehealth (HOSPITAL_COMMUNITY): Payer: Self-pay | Admitting: Psychiatry

## 2020-12-28 ENCOUNTER — Encounter (HOSPITAL_COMMUNITY): Payer: Self-pay

## 2021-01-03 NOTE — Progress Notes (Deleted)
Psychiatric Initial Adult Assessment   Patient Identification: Audrey [name] MRN:  [id number] Date of Evaluation:  01/03/2021 Referral Source: *** Chief Complaint:   Visit Diagnosis: No diagnosis found.  History of Present Illness:   Audrey [name] is a 45 y.o. year old female with a history of depression, PTSD, pseudotumor cerebri, with a history of degenerative disc disease, chronic pain syndrome with opioid use, sciatica, heart murmur, who is referred for depression.   hisotry of self harm, hallucinations      Associated Signs/Symptoms: Depression Symptoms:  {DEPRESSION SYMPTOMS:20000} (Hypo) Manic Symptoms:  {BHH MANIC SYMPTOMS:22872} Anxiety Symptoms:  {BHH ANXIETY SYMPTOMS:22873} Psychotic Symptoms:  {BHH PSYCHOTIC SYMPTOMS:22874} PTSD Symptoms: {BHH PTSD SYMPTOMS:22875}  Past Psychiatric History:  Outpatient:  Psychiatry admission:  Previous suicide attempt:  Past trials of medication:  History of violence:    Previous Psychotropic Medications: {YES/NO:21197}  Substance Abuse History in the last 12 months:  {yes no:314532}  Consequences of Substance Abuse: {BHH CONSEQUENCES OF SUBSTANCE ABUSE:22880}  Past Medical History:  Past Medical History:  Diagnosis Date   Arthritis    Heart murmur    Pseudotumor cerebri    Sciatica     Past Surgical History:  Procedure Laterality Date   ABDOMINAL HYSTERECTOMY     ABDOMINAL SURGERY     APPENDECTOMY     CARDIAC CATHETERIZATION     CHOLECYSTECTOMY     KNEE SURGERY     TONSILLECTOMY     TUBAL LIGATION      Family Psychiatric History: ***  Family History: No family history on file.  Social History:   Social History   Socioeconomic History   Marital status: Married    Spouse name: Not on file   Number of children: Not on file   Years of education: Not on file   Highest education level: Not on file  Occupational History   Not on file  Tobacco Use   Smoking status: Never   Smokeless tobacco:  Never  Vaping Use   Vaping Use: Never used  Substance and Sexual Activity   Alcohol use: Never   Drug use: Never   Sexual activity: Not on file  Other Topics Concern   Not on file  Social History Narrative   Not on file   Social Determinants of Health   Financial Resource Strain: Not on file  Food Insecurity: Not on file  Transportation Needs: Not on file  Physical Activity: Not on file  Stress: Not on file  Social Connections: Not on file    Additional Social History: ***  Allergies:   Allergies  Allergen Reactions   Compazine [Prochlorperazine]    Nitroglycerin Er    Penicillins     Metabolic Disorder Labs: No results found for: HGBA1C, MPG No results found for: PROLACTIN No results found for: CHOL, TRIG, HDL, CHOLHDL, VLDL, LDLCALC No results found for: TSH  Therapeutic Level Labs: No results found for: LITHIUM No results found for: CBMZ No results found for: VALPROATE  Current Medications: Current Outpatient Medications  Medication Sig Dispense Refill   Lidocaine (HM LIDOCAINE PATCH) 4 % PTCH Apply 1 patch topically daily. 15 patch 0   tiZANidine (ZANAFLEX) 4 MG tablet Take 1 tablet (4 mg total) by mouth every 12 (twelve) hours as needed for muscle spasms. 10 tablet 0   No current facility-administered medications for this visit.    Musculoskeletal: Strength & Muscle Tone:  N/A Gait & Station:  N/A Patient leans: N/A  Psychiatric Specialty Exam: Review of Systems  There were no vitals taken for this visit.There is no height or weight on file to calculate BMI.  General Appearance: {Appearance:22683}  Eye Contact:  {BHH EYE CONTACT:22684}  Speech:  Clear and Coherent  Volume:  Normal  Mood:  {BHH MOOD:22306}  Affect:  {Affect (PAA):22687}  Thought Process:  Coherent  Orientation:  Full (Time, Place, and Person)  Thought Content:  Logical  Suicidal Thoughts:  {ST/HT (PAA):22692}  Homicidal Thoughts:  {ST/HT (PAA):22692}  Memory:  Immediate;    Good  Judgement:  {Judgement (PAA):22694}  Insight:  {Insight (PAA):22695}  Psychomotor Activity:  Normal  Concentration:  Concentration: Good and Attention Span: Good  Recall:  Good  Fund of Knowledge:Good  Language: Good  Akathisia:  No  Handed:  Right  AIMS (if indicated):  not done  Assets:  Communication Skills Desire for Improvement  ADL's:  Intact  Cognition: WNL  Sleep:  {BHH GOOD/FAIR/POOR:22877}   Screenings: Flowsheet Row ED from 12/02/2020 in [facility] ED from 11/29/2020 in [facility]  C-SSRS RISK CATEGORY No Risk No Risk       Assessment and Plan:  Assessment  Plan   The patient demonstrates the following risk factors for suicide: Chronic risk factors for suicide include: {Chronic Risk Factors for NTZGYFV:49449675}. Acute risk factors for suicide include: {Acute Risk Factors for FFMBWGY:65993570}. Protective factors for this patient include: {Protective Factors for Suicide VXBL:39030092}. Considering these factors, the overall suicide risk at this point appears to be {Desc; low/moderate/high:110033}. Patient {ACTION; IS/IS ZRA:07622633} appropriate for outpatient follow up.   Neysa Hotter, MD 10/7/202210:36 AM

## 2021-01-04 ENCOUNTER — Telehealth (HOSPITAL_COMMUNITY): Payer: Self-pay | Admitting: Psychiatry

## 2021-01-04 ENCOUNTER — Other Ambulatory Visit: Payer: Self-pay
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
## 2021-01-08 ENCOUNTER — Encounter (HOSPITAL_COMMUNITY): Payer: Self-pay | Admitting: Emergency Medicine

## 2021-01-08 ENCOUNTER — Emergency Department (HOSPITAL_COMMUNITY): Payer: BC Managed Care – PPO

## 2021-01-08 ENCOUNTER — Emergency Department (HOSPITAL_COMMUNITY)
Admission: EM | Admit: 2021-01-08 | Discharge: 2021-01-09 | Disposition: A | Discharge status: Home / Self Care | Payer: BC Managed Care – PPO | Attending: Emergency Medicine | Admitting: Emergency Medicine

## 2021-01-08 DIAGNOSIS — R0789 Other chest pain: Secondary | ICD-10-CM | POA: Insufficient documentation

## 2021-01-08 DIAGNOSIS — R5383 Other fatigue: Secondary | ICD-10-CM | POA: Diagnosis present

## 2021-01-08 DIAGNOSIS — Z20822 Contact with and (suspected) exposure to covid-19: Secondary | ICD-10-CM | POA: Insufficient documentation

## 2021-01-08 DIAGNOSIS — R55 Syncope and collapse: Secondary | ICD-10-CM | POA: Diagnosis not present

## 2021-01-08 DIAGNOSIS — J069 Acute upper respiratory infection, unspecified: Secondary | ICD-10-CM | POA: Diagnosis not present

## 2021-01-08 LAB — CBC WITH DIFFERENTIAL/PLATELET
Abs Immature Granulocytes: 0.06 10*3/uL (ref 0.00–0.07)
Basophils Absolute: 0.1 10*3/uL (ref 0.0–0.1)
Basophils Relative: 1 %
Eosinophils Absolute: 0.3 10*3/uL (ref 0.0–0.5)
Eosinophils Relative: 4 %
HCT: 40.9 % (ref 36.0–46.0)
Hemoglobin: 13.3 g/dL (ref 12.0–15.0)
Immature Granulocytes: 1 %
Lymphocytes Relative: 22 %
Lymphs Abs: 1.8 10*3/uL (ref 0.7–4.0)
MCH: 31.3 pg (ref 26.0–34.0)
MCHC: 32.5 g/dL (ref 30.0–36.0)
MCV: 96.2 fL (ref 80.0–100.0)
Monocytes Absolute: 0.5 10*3/uL (ref 0.1–1.0)
Monocytes Relative: 6 %
Neutro Abs: 5.6 10*3/uL (ref 1.7–7.7)
Neutrophils Relative %: 66 %
Platelets: 231 10*3/uL (ref 150–400)
RBC: 4.25 MIL/uL (ref 3.87–5.11)
RDW: 15.9 % — ABNORMAL HIGH (ref 11.5–15.5)
WBC: 8.4 10*3/uL (ref 4.0–10.5)
nRBC: 0 % (ref 0.0–0.2)

## 2021-01-08 LAB — RESP PANEL BY RT-PCR (FLU A&B, COVID) ARPGX2
Influenza A by PCR: NEGATIVE
Influenza B by PCR: NEGATIVE
SARS Coronavirus 2 by RT PCR: NEGATIVE

## 2021-01-08 LAB — COMPREHENSIVE METABOLIC PANEL
ALT: 37 U/L (ref 0–44)
AST: 52 U/L — ABNORMAL HIGH (ref 15–41)
Albumin: 3.6 g/dL (ref 3.5–5.0)
Alkaline Phosphatase: 151 U/L — ABNORMAL HIGH (ref 38–126)
Anion gap: 9 (ref 5–15)
BUN: 6 mg/dL (ref 6–20)
CO2: 24 mmol/L (ref 22–32)
Calcium: 8.8 mg/dL — ABNORMAL LOW (ref 8.9–10.3)
Chloride: 110 mmol/L (ref 98–111)
Creatinine, Ser: 0.73 mg/dL (ref 0.44–1.00)
GFR, Estimated: >60 mL/min (ref >60)
Glucose, Bld: 98 mg/dL (ref 70–99)
Potassium: 3.7 mmol/L (ref 3.5–5.1)
Sodium: 143 mmol/L (ref 135–145)
Total Bilirubin: 0.6 mg/dL (ref 0.3–1.2)
Total Protein: 7 g/dL (ref 6.5–8.1)

## 2021-01-08 LAB — CBG MONITORING, ED: Glucose-Capillary: 98 mg/dL (ref 70–99)

## 2021-01-08 LAB — TROPONIN I (HIGH SENSITIVITY): Troponin I (High Sensitivity): 3 ng/L (ref <18)

## 2021-01-08 LAB — LIPASE, BLOOD: Lipase: 21 U/L (ref 11–51)

## 2021-01-08 MED ORDER — ACETAZOLAMIDE 125 MG PO TABS: 125.0000 mg | ORAL_TABLET | Freq: Two times a day (BID) | ORAL | 0 refills | Status: DC

## 2021-01-08 MED ORDER — DIAZEPAM 2 MG PO TABS
2.0000 mg | ORAL_TABLET | Freq: Once | ORAL | Status: AC
  Administered 2021-01-08: 2 mg via ORAL
  Filled 2021-01-08: qty 1

## 2021-01-08 MED ORDER — HYDROCODONE-ACETAMINOPHEN 5-325 MG PO TABS
1.0000 | ORAL_TABLET | Freq: Once | ORAL | Status: AC
  Administered 2021-01-08: 1 via ORAL
  Filled 2021-01-08: qty 1

## 2021-01-08 MED ORDER — ONDANSETRON HCL 4 MG PO TABS: 4.0000 mg | ORAL_TABLET | Freq: Four times a day (QID) | ORAL | 0 refills | Status: DC

## 2021-01-08 MED ORDER — SODIUM CHLORIDE 0.9 % IV SOLN: 1000.0000 mL | INTRAVENOUS | Status: DC

## 2021-01-08 MED ORDER — BENZONATATE 100 MG PO CAPS: 100.0000 mg | ORAL_CAPSULE | Freq: Three times a day (TID) | ORAL | 0 refills | Status: DC

## 2021-01-08 MED ORDER — ONDANSETRON HCL 4 MG/2ML IJ SOLN
4.0000 mg | Freq: Once | INTRAMUSCULAR | Status: AC
  Administered 2021-01-08: 4 mg via INTRAVENOUS
  Filled 2021-01-08: qty 2

## 2021-01-08 MED ORDER — BENZONATATE 100 MG PO CAPS
200.0000 mg | ORAL_CAPSULE | Freq: Once | ORAL | Status: AC
  Administered 2021-01-08: 200 mg via ORAL
  Filled 2021-01-08: qty 2

## 2021-01-08 MED ORDER — SODIUM CHLORIDE 0.9 % IV BOLUS (SEPSIS)
1000.0000 mL | Freq: Once | INTRAVENOUS | Status: AC
  Administered 2021-01-08: 1000 mL via INTRAVENOUS

## 2021-01-08 NOTE — ED Provider Notes (Signed)
[facility]-EMERGENCY DEPT Provider Note   CSN: [id number] Arrival date & time: 01/08/21  1600     History Chief Complaint  Patient presents with   flu like symptoms    Audrey [name] is a 45 y.o. female.  HPI  Patient presents to the ED for, myalgias, nausea and fatigue.  Patient states she has been vaccinated for COVID but also has had it in the past.  She tested at home for COVID today and it was positive.  She came to the ER for evaluation.  Patient states she had an episode of nausea and vomiting as well as a syncopal episode.  She is also having discomfort in her chest. Patient requests a dose of her home medications that include Valium and hydrocodone Past Medical History:  Diagnosis Date   Arthritis    Heart murmur    Pseudotumor cerebri    Sciatica     There are no problems to display for this patient.   Past Surgical History:  Procedure Laterality Date   ABDOMINAL HYSTERECTOMY     ABDOMINAL SURGERY     APPENDECTOMY     CARDIAC CATHETERIZATION     CHOLECYSTECTOMY     KNEE SURGERY     TONSILLECTOMY     TUBAL LIGATION       OB History   No obstetric history on file.     No family history on file.  Social History   Tobacco Use   Smoking status: Never   Smokeless tobacco: Never  Vaping Use   Vaping Use: Never used  Substance Use Topics   Alcohol use: Never   Drug use: Never    Home Medications Prior to Admission medications   Medication Sig Start Date End Date Taking? Authorizing Provider  benzonatate (TESSALON) 100 MG capsule Take 1 capsule (100 mg total) by mouth every 8 (eight) hours. 01/08/21  Yes Linwood Dibbles, MD  ondansetron (ZOFRAN) 4 MG tablet Take 1 tablet (4 mg total) by mouth every 6 (six) hours. 01/08/21  Yes Linwood Dibbles, MD  Lidocaine (HM LIDOCAINE PATCH) 4 % PTCH Apply 1 patch topically daily. 11/30/20   McDonald, Mia A, PA-C  tiZANidine (ZANAFLEX) 4 MG tablet Take 1 tablet (4 mg total) by mouth every 12  (twelve) hours as needed for muscle spasms. 11/30/20   McDonald, Mia A, PA-C    Allergies    Compazine [prochlorperazine], Nitroglycerin er, and Penicillins  Review of Systems   Review of Systems  All other systems reviewed and are negative.  Physical Exam Updated Vital Signs BP (!) 136/96 (BP Location: Left Arm)   Pulse (!) 105   Temp 98.8 F (37.1 C) (Oral)   Resp (!) 25   SpO2 99%   Physical Exam Vitals and nursing note reviewed.  Constitutional:      General: She is not in acute distress.    Appearance: She is well-developed.  HENT:     Head: Normocephalic and atraumatic.     Right Ear: External ear normal.     Left Ear: External ear normal.  Eyes:     General: No scleral icterus.       Right eye: No discharge.        Left eye: No discharge.     Conjunctiva/sclera: Conjunctivae normal.     Comments: No papilledema noted on funduscopic exam  Neck:     Trachea: No tracheal deviation.  Cardiovascular:     Rate and Rhythm: Regular rhythm. Tachycardia present.  Pulmonary:     Effort: Pulmonary effort is normal. No respiratory distress.     Breath sounds: Normal breath sounds. No stridor. No wheezing or rales.  Abdominal:     General: Bowel sounds are normal. There is no distension.     Palpations: Abdomen is soft.     Tenderness: There is no abdominal tenderness. There is no guarding or rebound.  Musculoskeletal:        General: No tenderness or deformity.     Cervical back: Neck supple.  Skin:    General: Skin is warm and dry.     Findings: No rash.  Neurological:     General: No focal deficit present.     Mental Status: She is alert.     Cranial Nerves: No cranial nerve deficit (no facial droop, extraocular movements intact, no slurred speech).     Sensory: No sensory deficit.     Motor: No abnormal muscle tone or seizure activity.     Coordination: Coordination normal.  Psychiatric:        Mood and Affect: Mood normal.    ED Results / Procedures /  Treatments   Labs (all labs ordered are listed, but only abnormal results are displayed) Labs Reviewed  CBC WITH DIFFERENTIAL/PLATELET - Abnormal; Notable for the following components:      Result Value   RDW 15.9 (*)    All other components within normal limits  COMPREHENSIVE METABOLIC PANEL - Abnormal; Notable for the following components:   Calcium 8.8 (*)    AST 52 (*)    Alkaline Phosphatase 151 (*)    All other components within normal limits  RESP PANEL BY RT-PCR (FLU A&B, COVID) ARPGX2  LIPASE, BLOOD  CBG MONITORING, ED  TROPONIN I (HIGH SENSITIVITY)    EKG None  Radiology DG Chest 2 View  Result Date: 01/08/2021 CLINICAL DATA:  45 year old female with history of cough. Flu-like symptoms. EXAM: CHEST - 2 VIEW COMPARISON:  Chest x-ray 12/02/2020. FINDINGS: Lung volumes are low. Azygous lobe (normal anatomical variant) incidentally noted. No consolidative airspace disease. No pleural effusions. No pneumothorax. No pulmonary nodule or mass noted. Pulmonary vasculature and the cardiomediastinal silhouette are within normal limits. IMPRESSION: 1. Low lung volumes without radiographic evidence of acute cardiopulmonary disease. Electronically Signed   By: Trudie Reed M.D.   On: 01/08/2021 17:56    Procedures Procedures   Medications Ordered in ED Medications  sodium chloride 0.9 % bolus 1,000 mL (0 mLs Intravenous Stopped 01/08/21 2235)    Followed by  0.9 %  sodium chloride infusion (has no administration in time range)  HYDROcodone-acetaminophen (NORCO/VICODIN) 5-325 MG per tablet 1 tablet (1 tablet Oral Given 01/08/21 2114)  diazepam (VALIUM) tablet 2 mg (2 mg Oral Given 01/08/21 2114)  ondansetron (ZOFRAN) injection 4 mg (4 mg Intravenous Given 01/08/21 2119)  benzonatate (TESSALON) capsule 200 mg (200 mg Oral Given 01/08/21 2158)    ED Course  I have reviewed the triage vital signs and the nursing notes.  Pertinent labs & imaging results that were available  during my care of the patient were reviewed by me and considered in my medical decision making (see chart for details).  Clinical Course as of 01/08/21 2245  Wed Jan 08, 2021  2147 CBC and metabolic panel unremarkable.  Troponin is normal.  Lipase is normal.  COVID and flu are negative [JK]  2147 CT angiogram was performed last month for similar presentation.  No acute PE noted [JK]  2148 Persistent  tachycardia noted back on previous visit in September [JK]  2236 Records from [place] reviewed.  Patient did have previous LPs with normal opening pressures.  She did have one with an opening pressure of 38.  Patient has been placed on Diamox previously [JK]  2237 Discussed case with Dr. Derry Lory.  NO objective signs of papilledema at this time.  Will refer for urgent outpt  neurology evaluation. [JK]    Clinical Course User Index [JK] Linwood Dibbles, MD   MDM Rules/Calculators/A&P                           Patient presented with viral URI type symptoms.  Symptoms concerning for the possibility of COVID as well as pneumonia.  ED work-up is reassuring.  Laboratory tests are unremarkable.  Chest x-ray and does not show pneumonia.  COVID test is negative.  Patient's heart rate has improved with IV fluids and symptomatic care.  After discussing her test results patient then said she was concerned about her pseudotumor.  She states she carries this diagnosis but moved from [place] and does not have a doctor here.  She states that previously she had a shunt that was not working.  She states she has had spinal taps in the past but right now they usually give her medications to sedate her.  Patient is concerned that her vision is worsening and she has no one to see.  Patient does not have any papilledema on exam.  Her visual acuity is preserved at this time.  Discussed the case with Dr. Derry Lory.  He recommends starting the patient on Diamox.  Patient states that she had trouble with that in the past but can  take it at a lower dose.  I will also give her an outpatient urgent referral to neurology.  Patient is comfortable with this plan. Final Clinical Impression(s) / ED Diagnoses Final diagnoses:  Upper respiratory tract infection, unspecified type    Rx / DC Orders ED Discharge Orders          Ordered    benzonatate (TESSALON) 100 MG capsule  Every 8 hours        01/08/21 2245    ondansetron (ZOFRAN) 4 MG tablet  Every 6 hours        01/08/21 2245    Ambulatory referral to Neurology       Comments: An appointment is requested in approximately: 2 weeks Pt has history of IIH.  Recently moved to the area.   01/08/21 2245             Linwood Dibbles, MD 01/08/21 2246

## 2021-01-08 NOTE — ED Notes (Signed)
[facility] called to transport pt back home 

## 2021-01-08 NOTE — ED Triage Notes (Signed)
Per pt, states flu like symptoms, cough, spitting up [place], fatigued-

## 2021-01-08 NOTE — ED Provider Notes (Signed)
Emergency Medicine Provider Triage Evaluation Note  Audrey [name] , a 45 y.o. female  was evaluated in triage.  Pt complains of cough, body aches, chest pain, nausea, vomiting.  Her symptoms have been ongoing for 3 days.  She reports productive cough.  She feels very weak all over.  Review of Systems  Positive: Cough, N/V/, chest pain Negative: Leg swelling  Physical Exam  BP (!) 162/121   Pulse (!) 132   Temp 98.8 F (37.1 C) (Oral)   Resp 18   SpO2 97%  Gen:   Awake, vomiting  Resp:  Normal effort  MSK:   Moves extremities without difficulty  Other:  Vomiting while I am in the room.  Feels to feel unwell.  Medical Decision Making  Medically screening exam initiated at 5:10 PM.  Appropriate orders placed.  Jannis Atkins was informed that the remainder of the evaluation will be completed by another provider, this initial triage assessment does not replace that evaluation, and the importance of remaining in the ED until their evaluation is complete.  Note: Portions of this report may have been transcribed using voice recognition software. Every effort was made to ensure accuracy; however, inadvertent computerized transcription errors may be present    Norman Clay 01/08/21 1712    Linwood Dibbles, MD 01/08/21 [phone]

## 2021-01-08 NOTE — Discharge Instructions (Signed)
Take the medications to help with your cough.  Follow-up with the neurologist for further evaluation.

## 2021-01-16 ENCOUNTER — Emergency Department (HOSPITAL_BASED_OUTPATIENT_CLINIC_OR_DEPARTMENT_OTHER): Payer: BC Managed Care – PPO

## 2021-01-16 ENCOUNTER — Emergency Department (HOSPITAL_BASED_OUTPATIENT_CLINIC_OR_DEPARTMENT_OTHER)
Admission: EM | Admit: 2021-01-16 | Discharge: 2021-01-16 | Disposition: A | Discharge status: Home / Self Care | Payer: BC Managed Care – PPO | Attending: Emergency Medicine | Admitting: Emergency Medicine

## 2021-01-16 ENCOUNTER — Other Ambulatory Visit: Payer: Self-pay

## 2021-01-16 ENCOUNTER — Encounter (HOSPITAL_BASED_OUTPATIENT_CLINIC_OR_DEPARTMENT_OTHER): Payer: Self-pay

## 2021-01-16 DIAGNOSIS — S0990XA Unspecified injury of head, initial encounter: Secondary | ICD-10-CM | POA: Insufficient documentation

## 2021-01-16 DIAGNOSIS — W06XXXA Fall from bed, initial encounter: Secondary | ICD-10-CM | POA: Insufficient documentation

## 2021-01-16 DIAGNOSIS — W19XXXA Unspecified fall, initial encounter: Secondary | ICD-10-CM

## 2021-01-16 DIAGNOSIS — S301XXA Contusion of abdominal wall, initial encounter: Secondary | ICD-10-CM | POA: Diagnosis not present

## 2021-01-16 LAB — I-STAT VENOUS BLOOD GAS, ED
Acid-Base Excess: 6 mmol/L — ABNORMAL HIGH (ref 0.0–2.0)
Bicarbonate: 32 mmol/L — ABNORMAL HIGH (ref 20.0–28.0)
Calcium, Ion: 1.18 mmol/L (ref 1.15–1.40)
HCT: 39 % (ref 36.0–46.0)
Hemoglobin: 13.3 g/dL (ref 12.0–15.0)
O2 Saturation: 50 %
Patient temperature: 98.6
Potassium: 4.3 mmol/L (ref 3.5–5.1)
Sodium: 139 mmol/L (ref 135–145)
TCO2: 34 mmol/L — ABNORMAL HIGH (ref 22–32)
pCO2, Ven: 53.6 mmHg (ref 44.0–60.0)
pH, Ven: 7.384 (ref 7.250–7.430)
pO2, Ven: 28 mmHg — CL (ref 32.0–45.0)

## 2021-01-16 LAB — COMPREHENSIVE METABOLIC PANEL
ALT: 18 U/L (ref 0–44)
AST: 26 U/L (ref 15–41)
Albumin: 3.6 g/dL (ref 3.5–5.0)
Alkaline Phosphatase: 132 U/L — ABNORMAL HIGH (ref 38–126)
Anion gap: 10 (ref 5–15)
BUN: 7 mg/dL (ref 6–20)
CO2: 26 mmol/L (ref 22–32)
Calcium: 9.3 mg/dL (ref 8.9–10.3)
Chloride: 103 mmol/L (ref 98–111)
Creatinine, Ser: 0.82 mg/dL (ref 0.44–1.00)
GFR, Estimated: >60 mL/min (ref >60)
Glucose, Bld: 60 mg/dL — ABNORMAL LOW (ref 70–99)
Potassium: 3.6 mmol/L (ref 3.5–5.1)
Sodium: 139 mmol/L (ref 135–145)
Total Bilirubin: 0.6 mg/dL (ref 0.3–1.2)
Total Protein: 6.5 g/dL (ref 6.5–8.1)

## 2021-01-16 LAB — CBC WITH DIFFERENTIAL/PLATELET
Abs Immature Granulocytes: 0.08 10*3/uL — ABNORMAL HIGH (ref 0.00–0.07)
Basophils Absolute: 0.1 10*3/uL (ref 0.0–0.1)
Basophils Relative: 1 %
Eosinophils Absolute: 0.5 10*3/uL (ref 0.0–0.5)
Eosinophils Relative: 7 %
HCT: 40.1 % (ref 36.0–46.0)
Hemoglobin: 12.7 g/dL (ref 12.0–15.0)
Immature Granulocytes: 1 %
Lymphocytes Relative: 29 %
Lymphs Abs: 2.1 10*3/uL (ref 0.7–4.0)
MCH: 30.3 pg (ref 26.0–34.0)
MCHC: 31.7 g/dL (ref 30.0–36.0)
MCV: 95.7 fL (ref 80.0–100.0)
Monocytes Absolute: 0.5 10*3/uL (ref 0.1–1.0)
Monocytes Relative: 7 %
Neutro Abs: 3.8 10*3/uL (ref 1.7–7.7)
Neutrophils Relative %: 55 %
Platelets: 233 10*3/uL (ref 150–400)
RBC: 4.19 MIL/uL (ref 3.87–5.11)
RDW: 16.4 % — ABNORMAL HIGH (ref 11.5–15.5)
WBC: 7.1 10*3/uL (ref 4.0–10.5)
nRBC: 0 % (ref 0.0–0.2)

## 2021-01-16 LAB — ETHANOL: Alcohol, Ethyl (B): <10 mg/dL (ref <10)

## 2021-01-16 LAB — CBG MONITORING, ED: Glucose-Capillary: 74 mg/dL (ref 70–99)

## 2021-01-16 LAB — ACETAMINOPHEN LEVEL: Acetaminophen (Tylenol), Serum: <10 ug/mL — ABNORMAL LOW (ref 10–30)

## 2021-01-16 LAB — PROTIME-INR
INR: 1 (ref 0.8–1.2)
Prothrombin Time: 13.3 s (ref 11.4–15.2)

## 2021-01-16 LAB — LIPASE, BLOOD: Lipase: 28 U/L (ref 11–51)

## 2021-01-16 LAB — AMMONIA: Ammonia: 46 umol/L — ABNORMAL HIGH (ref 9–35)

## 2021-01-16 LAB — HCG, SERUM, QUALITATIVE: Preg, Serum: NEGATIVE

## 2021-01-16 LAB — TSH: TSH: 3.896 u[IU]/mL (ref 0.350–4.500)

## 2021-01-16 LAB — SALICYLATE LEVEL: Salicylate Lvl: <7 mg/dL — ABNORMAL LOW (ref 7.0–30.0)

## 2021-01-16 MED ORDER — ACETAMINOPHEN 500 MG PO TABS
1000.0000 mg | ORAL_TABLET | Freq: Once | ORAL | Status: AC
  Administered 2021-01-16: 1000 mg via ORAL
  Filled 2021-01-16: qty 2

## 2021-01-16 MED ORDER — SODIUM CHLORIDE 0.9 % IV SOLN: Freq: Once | INTRAVENOUS | Status: AC

## 2021-01-16 NOTE — ED Provider Notes (Signed)
[facility] EMERGENCY DEPT Provider Note   CSN: [id number] Arrival date & time: 01/16/21  4854     History Chief Complaint  Patient presents with   Fall   Altered Mental Status    Audrey [name] is a 45 y.o. female.  HPI Patient reports that she fell out of bed in the middle of the night, about 4 AM.  She reports that she first struck her head on the nightstand and then fell onto her left arm and side.  She reports that she has a lot of pain in her left side of her body particularly in her hip and down towards her knee.  Patient reports she does have a general headache.  Patient reports that she was stuck for a while and she did not have her phone nearby so she had to work her way out of her position and then get her phone charger before she could call EMS.  EMS was called later in the morning.  EMS reports on arrival patient was sitting up in her chair.  They report that she was interactive and clear mental status, joking with them.  On arrival, patient is more sluggish and slow to provide history.  However, she can give full history of events yesterday evening.  Patient does take multiple medications and reports history of pseudotumor cerebri.  Patient first reported to me that she took her morning medications at about 7:30 AM, reporting taking tizanidine, Seroquel, gabapentin but then determined that her last dose had been at 7:30 in the evening and not in the morning.  Patient reports that Diamox was started more recently.  She reports that she has had pseudotumor cerebri for over 20 years.  She is scheduled to follow-up with neurology for recheck soon.  She reports she has had headaches on and off for as long as she is had pseudotumor.  She reports some headache since her fall last night but not recently acute severe or changing headache.    Past Medical History:  Diagnosis Date   Arthritis    Heart murmur    Pseudotumor cerebri    Sciatica     There are no  problems to display for this patient.   Past Surgical History:  Procedure Laterality Date   ABDOMINAL HYSTERECTOMY     ABDOMINAL SURGERY     APPENDECTOMY     CARDIAC CATHETERIZATION     CHOLECYSTECTOMY     KNEE SURGERY     TONSILLECTOMY     TUBAL LIGATION       OB History   No obstetric history on file.     No family history on file.  Social History   Tobacco Use   Smoking status: Never   Smokeless tobacco: Never  Vaping Use   Vaping Use: Never used  Substance Use Topics   Alcohol use: Never   Drug use: Never    Home Medications Prior to Admission medications   Medication Sig Start Date End Date Taking? Authorizing Provider  acetaZOLAMIDE (DIAMOX) 125 MG tablet Take 1 tablet (125 mg total) by mouth 2 (two) times daily. 01/08/21  Yes Linwood Dibbles, MD  benzonatate (TESSALON) 100 MG capsule Take 1 capsule (100 mg total) by mouth every 8 (eight) hours. 01/08/21  Yes Linwood Dibbles, MD  DULoxetine (CYMBALTA) 30 MG capsule Take 30 mg by mouth daily.   Yes [provider]  gabapentin (NEURONTIN) 600 MG tablet Take 600 mg by mouth 3 (three) times daily.   Yes [provider]  HYDROcodone-acetaminophen (NORCO) 10-325 MG tablet Take 1 tablet by mouth every 6 (six) hours as needed.   Yes [provider]  QUEtiapine (SEROQUEL) 100 MG tablet Take 100 mg by mouth 3 (three) times daily.   Yes [provider]  tiZANidine (ZANAFLEX) 4 MG tablet Take 1 tablet (4 mg total) by mouth every 12 (twelve) hours as needed for muscle spasms. 11/30/20  Yes McDonald, Mia A, PA-C  Lidocaine (HM LIDOCAINE PATCH) 4 % PTCH Apply 1 patch topically daily. 11/30/20   McDonald, Mia A, PA-C  ondansetron (ZOFRAN) 4 MG tablet Take 1 tablet (4 mg total) by mouth every 6 (six) hours. 01/08/21   Linwood Dibbles, MD    Allergies    Compazine [prochlorperazine], Nitroglycerin er, and Penicillins  Review of Systems   Review of Systems 10 systems reviewed and negative except as per  HPI Physical Exam Updated Vital Signs BP (!) 148/101 (BP Location: Right Wrist)   Pulse (!) 110   Temp 98.8 F (37.1 C) (Oral)   Resp 18   Ht 5\' 3"  (1.6 m)   Wt 131.5 kg   SpO2 98%   BMI 51.37 kg/m   Physical Exam Constitutional:      Comments: Alert GCS 15 no respiratory distress  HENT:     Head:     Comments: Superficial abrasion over the left brow.  No periorbital hematoma or other facial trauma.    Mouth/Throat:     Pharynx: Oropharynx is clear.  Eyes:     Extraocular Movements: Extraocular movements intact.     Comments: Pupils are symmetric and moderately dilated about 6 mm and symmetrically responsive.  Fundus exam does not appear to have papilledema.  Cardiovascular:     Rate and Rhythm: Normal rate and regular rhythm.  Pulmonary:     Effort: Pulmonary effort is normal.     Breath sounds: Normal breath sounds.  Abdominal:     General: There is no distension.     Palpations: Abdomen is soft.     Tenderness: There is no guarding.  Musculoskeletal:     Cervical back: Neck supple.     Comments: Patient has a ecchymotic bruise about 10 cm with a linear dry abrasion over the soft tissues of the left upper buttock\lower flank.  Area is tender.  No palpable deep hematoma.  Also some tenderness in appearance of probable hematoma of the upper posterior left leg.  No palpable focal large hematoma.  Patient does have significant adipose soft tissue possibly obscuring some bruising.  Lower legs are symmetric.  There are no deformities or effusions at the knees or the ankles.  Patient can use both lower extremities to position herself in the stretcher.  Both upper extremities as well without any focal deformities or significant bruising.  Patient is symmetrically using both upper extremities  Skin:    General: Skin is warm and dry.  Neurological:     General: No focal deficit present.     Mental Status: She is oriented to person, place, and time.    ED Results / Procedures /  Treatments   Labs (all labs ordered are listed, but only abnormal results are displayed) Labs Reviewed  COMPREHENSIVE METABOLIC PANEL - Abnormal; Notable for the following components:      Result Value   Glucose, Bld 60 (*)    Alkaline Phosphatase 132 (*)    All other components within normal limits  ACETAMINOPHEN LEVEL - Abnormal; Notable for the following components:  Acetaminophen (Tylenol), Serum <10 (*)    All other components within normal limits  SALICYLATE LEVEL - Abnormal; Notable for the following components:   Salicylate Lvl <7.0 (*)    All other components within normal limits  CBC WITH DIFFERENTIAL/PLATELET - Abnormal; Notable for the following components:   RDW 16.4 (*)    Abs Immature Granulocytes 0.08 (*)    All other components within normal limits  AMMONIA - Abnormal; Notable for the following components:   Ammonia 46 (*)    All other components within normal limits  I-STAT VENOUS BLOOD GAS, ED - Abnormal; Notable for the following components:   pO2, Ven 28.0 (*)    Bicarbonate 32.0 (*)    TCO2 34 (*)    Acid-Base Excess 6.0 (*)    All other components within normal limits  ETHANOL  LIPASE, BLOOD  HCG, SERUM, QUALITATIVE  PROTIME-INR  TSH  URINALYSIS, ROUTINE W REFLEX MICROSCOPIC  RAPID URINE DRUG SCREEN, HOSP PERFORMED  BLOOD GAS, VENOUS  CBG MONITORING, ED    EKG EKG Interpretation  Date/Time:  Thursday January 16 2021 09:30:14 EDT Ventricular Rate:  106 PR Interval:  186 QRS Duration: 96 QT Interval:  396 QTC Calculation: 526 R Axis:   61 Text Interpretation: Sinus tachycardia Low voltage, precordial leads Borderline T abnormalities, anterior leads Prolonged QT interval no sig change from previous Confirmed by Arby Barrette [phone]) on 01/16/2021 12:33:43 PM  Radiology CT Head Wo Contrast  Result Date: 01/16/2021 CLINICAL DATA:  Head trauma, altered mental status EXAM: CT HEAD WITHOUT CONTRAST TECHNIQUE: Contiguous axial images were  obtained from the base of the skull through the vertex without intravenous contrast. COMPARISON:  12/02/2020 FINDINGS: Brain: No evidence of acute infarction, hemorrhage, hydrocephalus, extra-axial collection or mass lesion/mass effect. Vascular: No hyperdense vessel or unexpected calcification. Skull: Normal. Negative for fracture or focal lesion. Sinuses/Orbits: Orbits unremarkable. Mild maxillary sinus mucosal thickening. No sinus air-fluid level. Other: None. IMPRESSION: No acute intracranial abnormality. Normal head CT without contrast for age. Minor maxillary sinus disease. Electronically Signed   By: Judie Petit.  Shick M.D.   On: 01/16/2021 10:34   DG Pelvis Portable  Result Date: 01/16/2021 CLINICAL DATA:  Fall, trauma, lethargy, left hip pain EXAM: PORTABLE PELVIS 1-2 VIEWS COMPARISON:  None. FINDINGS: There is no evidence of pelvic fracture or diastasis. No pelvic bone lesions are seen. Bony pelvis and hips appear symmetric and intact. Nonobstructive bowel gas pattern. IMPRESSION: No acute osseous finding by plain radiography. Electronically Signed   By: Judie Petit.  Shick M.D.   On: 01/16/2021 10:37   DG Chest Port 1 View  Result Date: 01/16/2021 CLINICAL DATA:  Fall, trauma, lethargy EXAM: PORTABLE CHEST 1 VIEW COMPARISON:  01/08/2021 FINDINGS: Similar low lung volumes accentuating the cardiac size and mediastinal contours. Azygous lobe noted, normal variant. No focal pneumonia, collapse or consolidation. Negative for edema, effusion or pneumothorax. Trachea midline. IMPRESSION: Low volume exam.  No acute finding. Electronically Signed   By: Judie Petit.  Shick M.D.   On: 01/16/2021 10:36    Procedures Procedures   Medications Ordered in ED Medications  0.9 %  sodium chloride infusion ( Intravenous New Bag/Given 01/16/21 1045)  acetaminophen (TYLENOL) tablet 1,000 mg (1,000 mg Oral Given 01/16/21 1251)    ED Course  I have reviewed the triage vital signs and the nursing notes.  Pertinent labs & imaging  results that were available during my care of the patient were reviewed by me and considered in my medical decision making (see chart  for details).    MDM Rules/Calculators/A&P                           Patient presents after having fallen out of her bed at about 4 AM.  Patient has a very superficial abrasion above the left eye.  Ocular examination does not suggest ocular trauma.  CT head negative for acute findings.  Patient seemed a little delayed on arrival from EMS.  However EMS reports when they arrived patient was sitting up in a chair and actively interacting with them.  Once we initiated our evaluation, patient is not exhibiting any cognitive impairment.  She can follow commands appropriately.  She has good recall for multiple facets of her medical history.  No focal neurologic deficits.  At this time I do not have suspicion for acute stroke.  Patient has had long history of pseudotumor cerebri.  Review of systems does not suggest acute changes that would suggest active increased ICP.  Patient was recently started on Diamox and has close follow-up with neurology scheduled.  Patient is on multiple medications that may impair cognitive function and increased fall risk.  Recommendation will be for close monitoring of this both with her PCP and neurology follow-up.  Patient does have an abrasion and ecchymosis over the soft tissues of the left hip.  X-rays do not show acute fracture.  Patient is moving the hip appropriately.  At this time stable for discharge with close follow-up.  Commendations made for both follow-up with PCP for recheck and review of sedating medications as well as neurology as planned.  Final Clinical Impression(s) / ED Diagnoses Final diagnoses:  Fall, initial encounter  Minor head injury, initial encounter  Contusion of flank and back, initial encounter    Rx / DC Orders ED Discharge Orders     None        Arby Barrette, MD 01/16/21 1609

## 2021-01-16 NOTE — ED Triage Notes (Signed)
Pt arrives via [facility] for fall from her bed while sleeping. Pt reports striking her head on her nightstand. EMS reports pt A+Ox4.   On arrival pt lethargic, A+Ox3(time). Pt c/o L eye pain, L hip, L knee pain

## 2021-01-16 NOTE — Discharge Instructions (Addendum)
1.  Your CT scan does not show evidence of head injury.  You have had pseudotumor cerebri for many years.  You do need close monitoring and follow-up with neurology.  Follow head injury instructions 2.  You do have bruises on your lower back and hip area.  At this time your x-rays do not show any evidence of any broken bones.  Apply well wrapped ice packs to these areas. 3.  Follow-up with your family doctor for recheck within the next 1 to 4 days.  Return to the emergency department you have worsening or changing symptoms. 4.  Several of your medications in combination can cause significant sedation and incoordination.  Have extreme caution with your medications.  Try to decrease your gabapentin if possible and try to decrease or eliminate tizanidine if possible.

## 2021-01-16 NOTE — ED Notes (Signed)
RT Note: Venous Blood obtained for ISTAT VBG.

## 2021-01-16 NOTE — ED Notes (Signed)
Pt's CBG result was 74. Informed Billy-RN.

## 2021-04-01 ENCOUNTER — Other Ambulatory Visit: Payer: Self-pay

## 2021-04-01 ENCOUNTER — Emergency Department (HOSPITAL_COMMUNITY): Payer: BC Managed Care – PPO

## 2021-04-01 ENCOUNTER — Encounter (HOSPITAL_COMMUNITY): Payer: Self-pay | Admitting: *Deleted

## 2021-04-01 ENCOUNTER — Emergency Department (HOSPITAL_COMMUNITY)
Admission: EM | Admit: 2021-04-01 | Discharge: 2021-04-02 | Disposition: A | Discharge status: Other Acute Inpatient Hospital | Payer: BC Managed Care – PPO | Attending: Emergency Medicine | Admitting: Emergency Medicine

## 2021-04-01 DIAGNOSIS — F259 Schizoaffective disorder, unspecified: Secondary | ICD-10-CM | POA: Insufficient documentation

## 2021-04-01 DIAGNOSIS — R45851 Suicidal ideations: Secondary | ICD-10-CM | POA: Insufficient documentation

## 2021-04-01 DIAGNOSIS — R441 Visual hallucinations: Secondary | ICD-10-CM | POA: Diagnosis not present

## 2021-04-01 DIAGNOSIS — R440 Auditory hallucinations: Secondary | ICD-10-CM | POA: Insufficient documentation

## 2021-04-01 DIAGNOSIS — Z20822 Contact with and (suspected) exposure to covid-19: Secondary | ICD-10-CM | POA: Diagnosis not present

## 2021-04-01 HISTORY — DX: Post-traumatic stress disorder, unspecified: F43.10

## 2021-04-01 HISTORY — DX: Depression, unspecified: F32.A

## 2021-04-01 HISTORY — DX: Schizoaffective disorder, unspecified: F25.9

## 2021-04-01 HISTORY — DX: Anxiety disorder, unspecified: F41.9

## 2021-04-01 LAB — URINALYSIS, ROUTINE W REFLEX MICROSCOPIC
Bilirubin Urine: NEGATIVE
Glucose, UA: NEGATIVE mg/dL
Hgb urine dipstick: NEGATIVE
Ketones, ur: NEGATIVE mg/dL
Nitrite: NEGATIVE
Protein, ur: NEGATIVE mg/dL
Specific Gravity, Urine: 1.017 (ref 1.005–1.030)
pH: 6 (ref 5.0–8.0)

## 2021-04-01 LAB — CBC WITH DIFFERENTIAL/PLATELET
Abs Immature Granulocytes: 0.02 10*3/uL (ref 0.00–0.07)
Basophils Absolute: 0.1 10*3/uL (ref 0.0–0.1)
Basophils Relative: 1 %
Eosinophils Absolute: 0.3 10*3/uL (ref 0.0–0.5)
Eosinophils Relative: 5 %
HCT: 39.1 % (ref 36.0–46.0)
Hemoglobin: 12.5 g/dL (ref 12.0–15.0)
Immature Granulocytes: 0 %
Lymphocytes Relative: 27 %
Lymphs Abs: 1.9 10*3/uL (ref 0.7–4.0)
MCH: 31.8 pg (ref 26.0–34.0)
MCHC: 32 g/dL (ref 30.0–36.0)
MCV: 99.5 fL (ref 80.0–100.0)
Monocytes Absolute: 0.5 10*3/uL (ref 0.1–1.0)
Monocytes Relative: 6 %
Neutro Abs: 4.3 10*3/uL (ref 1.7–7.7)
Neutrophils Relative %: 61 %
Platelets: 204 10*3/uL (ref 150–400)
RBC: 3.93 MIL/uL (ref 3.87–5.11)
RDW: 14.8 % (ref 11.5–15.5)
WBC: 7.1 10*3/uL (ref 4.0–10.5)
nRBC: 0 % (ref 0.0–0.2)

## 2021-04-01 LAB — RAPID URINE DRUG SCREEN, HOSP PERFORMED
Amphetamines: NOT DETECTED
Barbiturates: NOT DETECTED
Benzodiazepines: POSITIVE — AB
Cocaine: NOT DETECTED
Opiates: POSITIVE — AB
Tetrahydrocannabinol: NOT DETECTED

## 2021-04-01 LAB — COMPREHENSIVE METABOLIC PANEL
ALT: 20 U/L (ref 0–44)
AST: 34 U/L (ref 15–41)
Albumin: 3.6 g/dL (ref 3.5–5.0)
Alkaline Phosphatase: 121 U/L (ref 38–126)
Anion gap: 6 (ref 5–15)
BUN: 5 mg/dL — ABNORMAL LOW (ref 6–20)
CO2: 27 mmol/L (ref 22–32)
Calcium: 8.7 mg/dL — ABNORMAL LOW (ref 8.9–10.3)
Chloride: 107 mmol/L (ref 98–111)
Creatinine, Ser: 0.74 mg/dL (ref 0.44–1.00)
GFR, Estimated: >60 mL/min (ref >60)
Glucose, Bld: 90 mg/dL (ref 70–99)
Potassium: 3.6 mmol/L (ref 3.5–5.1)
Sodium: 140 mmol/L (ref 135–145)
Total Bilirubin: 1.2 mg/dL (ref 0.3–1.2)
Total Protein: 6.6 g/dL (ref 6.5–8.1)

## 2021-04-01 LAB — RESP PANEL BY RT-PCR (FLU A&B, COVID) ARPGX2
Influenza A by PCR: NEGATIVE
Influenza B by PCR: NEGATIVE
SARS Coronavirus 2 by RT PCR: NEGATIVE

## 2021-04-01 LAB — I-STAT BETA HCG BLOOD, ED (MC, WL, AP ONLY): I-stat hCG, quantitative: <5 m[IU]/mL (ref <5)

## 2021-04-01 LAB — ACETAMINOPHEN LEVEL: Acetaminophen (Tylenol), Serum: <10 ug/mL — ABNORMAL LOW (ref 10–30)

## 2021-04-01 LAB — SALICYLATE LEVEL: Salicylate Lvl: <7 mg/dL — ABNORMAL LOW (ref 7.0–30.0)

## 2021-04-01 LAB — ETHANOL: Alcohol, Ethyl (B): <10 mg/dL (ref <10)

## 2021-04-01 MED ORDER — ACETAZOLAMIDE 250 MG PO TABS
125.0000 mg | ORAL_TABLET | Freq: Two times a day (BID) | ORAL | Status: DC
  Filled 2021-04-01 (×3): qty 1

## 2021-04-01 MED ORDER — ALPRAZOLAM 0.5 MG PO TABS
2.0000 mg | ORAL_TABLET | Freq: Three times a day (TID) | ORAL | Status: DC | PRN
  Administered 2021-04-01: 2 mg via ORAL
  Filled 2021-04-01: qty 4

## 2021-04-01 MED ORDER — DULOXETINE HCL 30 MG PO CPEP
30.0000 mg | ORAL_CAPSULE | Freq: Two times a day (BID) | ORAL | Status: DC
  Administered 2021-04-01 – 2021-04-02 (×2): 30 mg via ORAL
  Filled 2021-04-01 (×2): qty 1

## 2021-04-01 MED ORDER — TRAZODONE HCL 100 MG PO TABS
200.0000 mg | ORAL_TABLET | Freq: Every evening | ORAL | Status: DC | PRN
  Administered 2021-04-01: 200 mg via ORAL
  Filled 2021-04-01: qty 2

## 2021-04-01 MED ORDER — QUETIAPINE FUMARATE 50 MG PO TABS
50.0000 mg | ORAL_TABLET | Freq: Three times a day (TID) | ORAL | Status: DC
  Administered 2021-04-01 (×2): 50 mg via ORAL
  Filled 2021-04-01 (×2): qty 1

## 2021-04-01 MED ORDER — GABAPENTIN 300 MG PO CAPS
600.0000 mg | ORAL_CAPSULE | Freq: Three times a day (TID) | ORAL | Status: DC
  Administered 2021-04-01 – 2021-04-02 (×3): 600 mg via ORAL
  Filled 2021-04-01 (×3): qty 2

## 2021-04-01 MED ORDER — TIZANIDINE HCL 4 MG PO TABS
4.0000 mg | ORAL_TABLET | Freq: Two times a day (BID) | ORAL | Status: DC | PRN
  Administered 2021-04-02: 4 mg via ORAL
  Filled 2021-04-01: qty 1

## 2021-04-01 MED ORDER — LORAZEPAM 1 MG PO TABS
1.0000 mg | ORAL_TABLET | Freq: Once | ORAL | Status: AC
  Administered 2021-04-01: 1 mg via ORAL
  Filled 2021-04-01: qty 1

## 2021-04-01 MED ORDER — QUETIAPINE FUMARATE 100 MG PO TABS: 100.0000 mg | ORAL_TABLET | Freq: Two times a day (BID) | ORAL | Status: DC

## 2021-04-01 NOTE — ED Notes (Addendum)
Spoke with [facility] from [employer], [place].The called to inquire about the patient based on patient referral sent. Requested a urinalysis on the patient. Call lab to have UA added on. UA can be faced to [phone]. Kennedy Bucker or Waynetta Sandy can be reached at [phone].

## 2021-04-01 NOTE — ED Notes (Signed)
Pt belongings located in cabinet behind nurses station (9-12) black duffle bag and white pt bag.

## 2021-04-01 NOTE — ED Notes (Signed)
Urine faxed

## 2021-04-01 NOTE — ED Notes (Signed)
Pt reports currently having visual hallucinations with a man hiding behind a desk motioning for her to "shhhsh." Pt staring off worriedly to where she is seeing her hallucination.
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
## 2021-04-01 NOTE — ED Notes (Signed)
Per sitter, patient stated she was holding a plastic and she would "jab herself in the neck." Patient linens were changed and fork was removed from the patient. Patient is currently being evaluated by TTS.
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
## 2021-04-01 NOTE — ED Triage Notes (Addendum)
Pt is having visual and audible hallucinations, pt states she is suicidal thoughts of stabbing self in throat. States she has researched and mapped it out. States she also has a cough

## 2021-04-01 NOTE — ED Notes (Signed)
Patient moved to room 12 for tts consult

## 2021-04-01 NOTE — ED Provider Notes (Signed)
Care of the patient assumed from Dr. Rubin Payor at the change of shift. Here for hallucinations and SI. Has history of IIH, CT is unchanged. She is pending lab evaluation for medical clearance then TTS evaluation.  Physical Exam  BP (!) 143/91    Pulse (!) 102    Temp 98.4 F (36.9 C) (Oral)    Resp 18    Ht 5\' 3"  (1.6 m)    Wt 131.5 kg    SpO2 98%    BMI 51.37 kg/m   Physical Exam  ED Course/Procedures   Clinical Course as of 04/01/21 2217  Tue Apr 01, 2021  1607 Resp Panel by RT-PCR (Flu A&B, Covid) Nasopharyngeal Swab [NP]  1608 Rapid urine drug screen (hospital performed)(!) Opiates and benzos positive [NP]  1650 EtOH, ASA and APAP levels are neg. CMP is unremarkable. Patient's CBC is pending. She is requesting her home pain meds, Med Reconcilliation requested.  [CS]  1754 CBC is normal.  [CS]    Clinical Course User Index [CS] Apr 03, 2021, MD [NP] Pollyann Savoy, MD    Procedures  MDM  Medical Decision Making Problems Addressed: Suicidal ideation: acute illness or injury that poses a threat to life or bodily functions  Amount and/or Complexity of Data Reviewed Labs:  Decision-making details documented in ED Course.  Risk Decision regarding hospitalization.          Benjiman Core, MD 04/01/21 2218

## 2021-04-01 NOTE — BH Assessment (Signed)
Clinician message Pat Kocher, RN Hey. It's with TTS. Is the pt able to be assessed, if so the pt will need to be placed in a private room. Also is the pt under IVC?"   Clinician awaiting response.    Redmond Pulling, MS, [facility], [facility] Triage Specialist [phone]

## 2021-04-01 NOTE — ED Notes (Addendum)
Patient is recommended for inpatient treatment. No beds available at [facility] currently. Referrals for placement will be sent out.

## 2021-04-01 NOTE — ED Notes (Signed)
Patient states she does not take acetazolamide because it causes redness, heat and inflammation in her joints.

## 2021-04-01 NOTE — Progress Notes (Signed)
[facility] Review for bed offer  CSW received a phone call from [facility] reporting that they are reviewing pt. Facility inquired if pt was IVC and CSW advised that pt is not. Facility wanted to ensure that pt would have transportation home once discharged and CSW advised that CSW would not to follow-up. CSW provided phone number to speak with nursing.  CSW spoke with nursing, [address] who confirmed that she spoke with [facility] and that they are requesting pt's UA. CSW faxed UA to [place] at [phone]. Nursing stated that pt is unsure if she would have transportation due to pt being new to this community and unsure if her spouse will be able to obtain transportation. CSW will continue to assist and follow.   Maryjean Ka, MSW, LCSWA 04/01/2021 11:12 PM

## 2021-04-01 NOTE — ED Notes (Signed)
Patient changed into burgundy scrubs. Patient seen by security
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
## 2021-04-01 NOTE — ED Notes (Signed)
Pt's belongings (a black duffle bag and a pt belongings bag) are in patient belonging cupboards for room 9-12.

## 2021-04-01 NOTE — Progress Notes (Signed)
Inpatient Behavioral Health Placement  Pt meets inpatient criteria per Melbourne Abts, PA-C. There are no appropriate beds at [facility] per [facility] Fransico Michael, RN.   Referral was sent to the following facilities;   Destination Service Provider Address Phone Fax  [facility]  [address] [place] [address] [phone]  [facility]  [address] [place] [address] [phone]  [facility] [address]  [address] [place] [address] [phone]  [facility]  [address] [place], [place] [address] [phone]  [facility] [place] [address] [phone]  [facility]  [address] [place] [address] [phone]  [facility]  [address] [place] [address] [phone]  [facility]  [address] [place] [address] [phone]  [facility]  [address] [place] [address] [phone]  [facility]  [address], [place] [address] [phone]  [facility]  [address] [place] [address] [phone]  [facility]  [address] [place] [address] [phone]  [facility]  [address], [place] [address] [phone]  [facility]  [address], [place] [address] [phone]  [facility]  [address], [place] [address] [phone]  [facility]  [address], [place] [address] [phone]  [facility]  [address] [place]  [place] [address] [phone]  [facility]  [address] [place], [place] [address] [phone]  [facility]  [address], [place] [address] [phone]    Situation ongoing,  CSW will follow up.   Maryjean Ka, MSW, [facility] 04/01/2021  @ 10:03 PM

## 2021-04-01 NOTE — ED Notes (Signed)
Pt reports her hallucinations continually peeking around corners. Pt states "I didn't think I'd see them here."
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
## 2021-04-01 NOTE — ED Notes (Signed)
Pt being a difficult stick is reason for delay in lab work.

## 2021-04-02 LAB — BASIC METABOLIC PANEL
Anion gap: 6 (ref 5–15)
BUN: 6 mg/dL (ref 6–20)
CO2: 25 mmol/L (ref 22–32)
Calcium: 8.5 mg/dL — ABNORMAL LOW (ref 8.9–10.3)
Chloride: 107 mmol/L (ref 98–111)
Creatinine, Ser: 0.86 mg/dL (ref 0.44–1.00)
GFR, Estimated: >60 mL/min (ref >60)
Glucose, Bld: 105 mg/dL — ABNORMAL HIGH (ref 70–99)
Potassium: 3.7 mmol/L (ref 3.5–5.1)
Sodium: 138 mmol/L (ref 135–145)

## 2021-04-02 LAB — PHOSPHORUS: Phosphorus: 3.8 mg/dL (ref 2.5–4.6)

## 2021-04-02 LAB — MAGNESIUM: Magnesium: 2.1 mg/dL (ref 1.7–2.4)

## 2021-04-02 MED ORDER — ONDANSETRON 4 MG PO TBDP
4.0000 mg | ORAL_TABLET | Freq: Once | ORAL | Status: AC
  Administered 2021-04-02: 4 mg via ORAL

## 2021-04-02 MED ORDER — ONDANSETRON 4 MG PO TBDP
ORAL_TABLET | ORAL | Status: AC
  Filled 2021-04-02: qty 1

## 2021-04-02 MED ORDER — HYDROCODONE-ACETAMINOPHEN 5-325 MG PO TABS: 1.0000 | ORAL_TABLET | Freq: Once | ORAL | Status: AC

## 2021-04-02 MED ORDER — HYDROCODONE-ACETAMINOPHEN 5-325 MG PO TABS
1.0000 | ORAL_TABLET | Freq: Once | ORAL | Status: AC
  Administered 2021-04-02: 1 via ORAL
  Filled 2021-04-02: qty 1

## 2021-04-02 MED ORDER — HYDROCODONE-ACETAMINOPHEN 5-325 MG PO TABS
ORAL_TABLET | ORAL | Status: AC
  Administered 2021-04-02: 1 via ORAL
  Filled 2021-04-02: qty 1

## 2021-04-02 NOTE — ED Notes (Signed)
Call placed to spouse x2 at this time, unable to leave voicemail.

## 2021-04-02 NOTE — BH Assessment (Signed)
[facility] Assessment Progress Note   Per Caryn Bee, NP, this pt requires psychiatric hospitalization at this time.  Pt had reportedly been referred to outside facilities overnight, and [facility] had tentatively agreed to accept pt provided that transportation arrangements could be made for her return home after discharge.  Pt's nurse, Duwayne Heck, has reportedly contacted pt's husband, who agrees to pick her up when needed.  At 14:50 this Clinical research associate called [facility] and spoke to [place]  Pt has been accepted to their facility by Dr Carmela Rima to their [facility].  Fredna Dow, concurs with this disposition, as does the pt who is currently under voluntary status.  EDP Derwood Kaplan, MD and pt's nurse, Duwayne Heck, have been notified, and Duwayne Heck agrees to call report to [phone].  Pt is to be transported via [employer].  Doylene Canning, [place] Behavioral Health Coordinator [phone]

## 2021-04-02 NOTE — ED Notes (Signed)
Patient began vomiting suddenly. Patient also was incontinent of urine.

## 2021-04-02 NOTE — ED Provider Notes (Signed)
Emergency Medicine Observation Re-evaluation Note  Audrey [name] is a 46 y.o. female, seen on rounds today.  Pt initially presented to the ED for complaints of Hallucinations and Suicidal Currently, the patient is cooperative.  Physical Exam  BP 126/90 (BP Location: Left Arm)    Pulse 99    Temp 97.9 F (36.6 C) (Oral)    Resp 18    Ht 5\' 3"  (1.6 m)    Wt 131.5 kg    SpO2 94%    BMI 51.37 kg/m  Physical Exam General: No acute distress Cardiac: Regular rate Lungs: No respiratory distress Psych: Cooperative  ED Course / MDM  EKG:EKG Interpretation  Date/Time:  Wednesday April 02 2021 02:48:36 EST Ventricular Rate:  125 PR Interval:  167 QRS Duration: 85 QT Interval:  360 QTC Calculation: 520 R Axis:   0 Text Interpretation: Sinus tachycardia Low voltage, precordial leads Abnormal inferior Q waves Nonspecific T abnormalities, diffuse leads Prolonged QT interval Confirmed by [id number] (Ross Marcus) on 04/02/2021 3:00:20 AM  I have reviewed the labs performed to date as well as medications administered while in observation.  Recent changes in the last 24 hours include patient was complaining of nausea and vomiting.  She had emesis overnight.  I ordered repeat EKG and basic metabolic profile which are reassuring.  EKG and for some reason showed QT prolongation was resolved.  Labs are completely reassuring.  Patient was tachycardic overnight, but her heart rate is also come down.  She is stable for psych admission at this time.  She has passed oral challenge.  Plan  Current plan is for admission to inpatient psych.  Cletus Mehlhoff is not under involuntary commitment.     Pearlean Brownie, MD 04/02/21 [phone]

## 2021-04-02 NOTE — ED Notes (Signed)
Report called to to receiving facility at this time, spoke with Erskine Squibb.

## 2021-04-02 NOTE — ED Notes (Signed)
[facility] transport called. No ETA, but will call when they are on the way to pick up patient

## 2021-04-02 NOTE — ED Notes (Signed)
[facility] called, no answer, left message.

## 2021-04-02 NOTE — ED Notes (Signed)
Patient now complains of a headache.

## 2021-04-02 NOTE — Progress Notes (Signed)
Pt has a pending bed offer with [facility] [phone] as long as pt is willing to have transportation back home.   The facility will not provide transportation once pt's treatment is completed. CSW update nursing, Pat Kocher, Charity fundraiser. First shift to follow-up.   Maryjean Ka, MSW, LCSWA 04/02/2021 12:25 AM

## 2021-04-02 NOTE — ED Notes (Addendum)
Patient complaining of right sided chest pain radiating to the back.MD was made aware.

## 2021-04-02 NOTE — ED Notes (Signed)
Call placed to spouse Shontell Prosser x2 at this time. In regards to providing transportation from facility at the conclude of treatment.

## 2021-05-23 ENCOUNTER — Other Ambulatory Visit: Payer: Self-pay

## 2021-05-23 ENCOUNTER — Encounter (HOSPITAL_COMMUNITY): Payer: Self-pay

## 2021-05-23 ENCOUNTER — Emergency Department (HOSPITAL_COMMUNITY)
Admission: EM | Admit: 2021-05-23 | Discharge: 2021-05-23 | Disposition: A | Discharge status: Home / Self Care | Payer: BC Managed Care – PPO | Attending: Emergency Medicine | Admitting: Emergency Medicine

## 2021-05-23 ENCOUNTER — Emergency Department (HOSPITAL_COMMUNITY): Payer: BC Managed Care – PPO

## 2021-05-23 DIAGNOSIS — Z20822 Contact with and (suspected) exposure to covid-19: Secondary | ICD-10-CM | POA: Insufficient documentation

## 2021-05-23 DIAGNOSIS — M25562 Pain in left knee: Secondary | ICD-10-CM | POA: Insufficient documentation

## 2021-05-23 DIAGNOSIS — R55 Syncope and collapse: Secondary | ICD-10-CM | POA: Insufficient documentation

## 2021-05-23 DIAGNOSIS — W01198A Fall on same level from slipping, tripping and stumbling with subsequent striking against other object, initial encounter: Secondary | ICD-10-CM | POA: Diagnosis not present

## 2021-05-23 DIAGNOSIS — R42 Dizziness and giddiness: Secondary | ICD-10-CM | POA: Insufficient documentation

## 2021-05-23 DIAGNOSIS — R197 Diarrhea, unspecified: Secondary | ICD-10-CM | POA: Diagnosis not present

## 2021-05-23 DIAGNOSIS — M25561 Pain in right knee: Secondary | ICD-10-CM | POA: Insufficient documentation

## 2021-05-23 DIAGNOSIS — R112 Nausea with vomiting, unspecified: Secondary | ICD-10-CM | POA: Insufficient documentation

## 2021-05-23 DIAGNOSIS — W19XXXA Unspecified fall, initial encounter: Secondary | ICD-10-CM

## 2021-05-23 LAB — CBC WITH DIFFERENTIAL/PLATELET
Abs Immature Granulocytes: 0.06 10*3/uL (ref 0.00–0.07)
Basophils Absolute: 0.1 10*3/uL (ref 0.0–0.1)
Basophils Relative: 1 %
Eosinophils Absolute: 0.3 10*3/uL (ref 0.0–0.5)
Eosinophils Relative: 4 %
HCT: 45 % (ref 36.0–46.0)
Hemoglobin: 14.5 g/dL (ref 12.0–15.0)
Immature Granulocytes: 1 %
Lymphocytes Relative: 28 %
Lymphs Abs: 2.1 10*3/uL (ref 0.7–4.0)
MCH: 31.3 pg (ref 26.0–34.0)
MCHC: 32.2 g/dL (ref 30.0–36.0)
MCV: 97 fL (ref 80.0–100.0)
Monocytes Absolute: 0.4 10*3/uL (ref 0.1–1.0)
Monocytes Relative: 6 %
Neutro Abs: 4.3 10*3/uL (ref 1.7–7.7)
Neutrophils Relative %: 60 %
Platelets: 283 10*3/uL (ref 150–400)
RBC: 4.64 MIL/uL (ref 3.87–5.11)
RDW: 13.4 % (ref 11.5–15.5)
WBC: 7.3 10*3/uL (ref 4.0–10.5)
nRBC: 0 % (ref 0.0–0.2)

## 2021-05-23 LAB — BASIC METABOLIC PANEL
Anion gap: 9 (ref 5–15)
BUN: 7 mg/dL (ref 6–20)
CO2: 26 mmol/L (ref 22–32)
Calcium: 9 mg/dL (ref 8.9–10.3)
Chloride: 105 mmol/L (ref 98–111)
Creatinine, Ser: 0.97 mg/dL (ref 0.44–1.00)
GFR, Estimated: >60 mL/min (ref >60)
Glucose, Bld: 102 mg/dL — ABNORMAL HIGH (ref 70–99)
Potassium: 3.3 mmol/L — ABNORMAL LOW (ref 3.5–5.1)
Sodium: 140 mmol/L (ref 135–145)

## 2021-05-23 LAB — RESP PANEL BY RT-PCR (FLU A&B, COVID) ARPGX2
Influenza A by PCR: NEGATIVE
Influenza B by PCR: NEGATIVE
SARS Coronavirus 2 by RT PCR: NEGATIVE

## 2021-05-23 MED ORDER — ONDANSETRON HCL 4 MG PO TABS: 4.0000 mg | ORAL_TABLET | Freq: Four times a day (QID) | ORAL | 0 refills | Status: AC

## 2021-05-23 MED ORDER — BENZONATATE 100 MG PO CAPS: 100.0000 mg | ORAL_CAPSULE | Freq: Three times a day (TID) | ORAL | 0 refills | Status: DC

## 2021-05-23 MED ORDER — HYDROMORPHONE HCL 2 MG PO TABS
2.0000 mg | ORAL_TABLET | Freq: Once | ORAL | Status: AC
  Administered 2021-05-23: 2 mg via ORAL
  Filled 2021-05-23: qty 1

## 2021-05-23 MED ORDER — OXYCODONE-ACETAMINOPHEN 5-325 MG PO TABS: 1.0000 | ORAL_TABLET | Freq: Once | ORAL | Status: DC

## 2021-05-23 MED ORDER — ONDANSETRON 4 MG PO TBDP
4.0000 mg | ORAL_TABLET | Freq: Once | ORAL | Status: AC
  Administered 2021-05-23: 4 mg via ORAL
  Filled 2021-05-23: qty 1

## 2021-05-23 MED ORDER — DIAZEPAM 5 MG PO TABS
5.0000 mg | ORAL_TABLET | Freq: Once | ORAL | Status: AC
  Administered 2021-05-23: 5 mg via ORAL
  Filled 2021-05-23: qty 1

## 2021-05-23 MED ORDER — SODIUM CHLORIDE 0.9 % IV BOLUS
1000.0000 mL | Freq: Once | INTRAVENOUS | Status: AC
  Administered 2021-05-23: 1000 mL via INTRAVENOUS

## 2021-05-23 MED ORDER — MORPHINE SULFATE (PF) 4 MG/ML IV SOLN
4.0000 mg | Freq: Once | INTRAVENOUS | Status: AC
  Administered 2021-05-23: 4 mg via INTRAVENOUS
  Filled 2021-05-23: qty 1

## 2021-05-23 MED ORDER — LOPERAMIDE HCL 2 MG PO CAPS: 2.0000 mg | ORAL_CAPSULE | Freq: Four times a day (QID) | ORAL | 0 refills | Status: DC | PRN

## 2021-05-23 NOTE — ED Provider Triage Note (Signed)
Emergency Medicine Provider Triage Evaluation Note  Audrey [name] , a 46 y.o. female  was evaluated in triage.  Pt complains of near syncope, bilateral knee pain, and back pain.  Patient reports that just prior to arrival she was walking out of the bathroom when she felt lightheaded.  Patient reports that she tried to grab onto the counter but missed falling onto both of her knees.  Patient reports that she then fell forward landing on her stomach.  Patient denies any syncope.  Reports that she has had pain to bilateral knees, thoracic, and lumbar back since the fall.  Patient reports multiple spinal fusions throughout thoracic and lumbar spine.  Patient reports that she has been sick over the past 3 weeks with influenza.  States that she has been having nausea, vomiting, and diarrhea.  Has been trying to increase fluid intake however has not been eating much.  Review of Systems  Positive: Bilateral knee pain, thoracic back pain, lumbar pain, nausea, vomiting, diarrhea,  Negative: Syncope, numbness, weakness  Physical Exam  BP (!) 151/101 (BP Location: Right Arm)    Pulse (!) 111    Temp 98.5 F (36.9 C) (Oral)    Resp (!) 24    Ht 5\' 3"  (1.6 m)    Wt 127 kg    SpO2 96%    BMI 49.60 kg/m  Gen:   Awake, no distress   Resp:  Normal effort  MSK:   Moves extremities without difficulty  Other:  Tenderness to right patella.  Decreased range of motion secondary to complaints of pain to bilateral knees.  Difficult to assess patella location due to patient's body habitus.  Diffuse tenderness to thoracic and lumbar back.  Medical Decision Making  Medically screening exam initiated at 2:41 PM.  Appropriate orders placed.  Audrey [name] was informed that the remainder of the evaluation will be completed by another provider, this initial triage assessment does not replace that evaluation, and the importance of remaining in the ED until their evaluation is complete.     Pearlean Brownie,  Haskel Schroeder 05/23/21 1444

## 2021-05-23 NOTE — ED Provider Notes (Signed)
[facility] EMERGENCY DEPARTMENT Provider Note   CSN: [id number] Arrival date & time: 05/23/21  1424     History  Chief Complaint  Patient presents with   Fall   Knee Pain   Near Syncope    Audrey [name] is a 46 y.o. female with a past medical history of osteoarthritis, chronic back pain, depression, anxiety and schizoaffective disorder presenting today after a fall.  She reports that this morning she was leaving her bathroom and became lightheaded.  She tried to catch herself but ended up falling on her bilateral knees.  She complains of severe pain in her bilateral knees, worse on the right side.  Also reports that she has had multiple surgeries on both knees due to osteoarthritis. She says that she has been sick for the past 2 to 3 weeks with viral symptoms.  She has had diarrhea, nausea and vomiting for many days.  She and her husband have the same symptoms and tested negative for COVID at home.  Have not followed up with a primary care provider at this time.  She denies any chest pain, shortness of breath or dizziness right now.  Only concerned about musculoskeletal pain and headache.     Home Medications Prior to Admission medications   Medication Sig Start Date End Date Taking? Authorizing Provider  loperamide (IMODIUM) 2 MG capsule Take 1 capsule (2 mg total) by mouth 4 (four) times daily as needed for diarrhea or loose stools. 05/23/21  Yes Arelene Moroni A, PA-C  acetaZOLAMIDE (DIAMOX) 125 MG tablet Take 1 tablet (125 mg total) by mouth 2 (two) times daily. 01/08/21   Linwood Dibbles, MD  alprazolam Prudy Feeler) 2 MG tablet Take 2 mg by mouth 3 (three) times daily as needed. 10/07/20   [provider]  benzonatate (TESSALON) 100 MG capsule Take 1 capsule (100 mg total) by mouth every 8 (eight) hours. 05/23/21   Kaisen Ackers A, PA-C  diazepam (VALIUM) 5 MG tablet Take 5 mg by mouth 2 (two) times daily as needed. 03/19/21   [provider]   DULoxetine (CYMBALTA) 30 MG capsule Take 30 mg by mouth 3 (three) times daily.    [provider]  gabapentin (NEURONTIN) 600 MG tablet Take 600 mg by mouth 3 (three) times daily.    [provider]  hydrOXYzine (ATARAX) 50 MG tablet Take 100 mg by mouth at bedtime. 03/07/21   [provider]  Lidocaine (HM LIDOCAINE PATCH) 4 % PTCH Apply 1 patch topically daily. 11/30/20   McDonald, Mia A, PA-C  ondansetron (ZOFRAN) 4 MG tablet Take 1 tablet (4 mg total) by mouth every 6 (six) hours for 7 days. 05/23/21 05/30/21  Najiyah Paris A, PA-C  QUEtiapine (SEROQUEL) 100 MG tablet Take 100 mg by mouth 3 (three) times daily.    [provider]  tiZANidine (ZANAFLEX) 4 MG tablet Take 1 tablet (4 mg total) by mouth every 12 (twelve) hours as needed for muscle spasms. Patient taking differently: Take 4 mg by mouth 3 (three) times daily. 11/30/20   McDonald, Mia A, PA-C  traZODone (DESYREL) 100 MG tablet Take 200 mg by mouth at bedtime as needed. 11/07/20   [provider]  zolpidem (AMBIEN) 10 MG tablet Take 10 mg by mouth at bedtime as needed. 03/19/21   [provider]      Allergies    Compazine [prochlorperazine], Nitroglycerin er, and Penicillins    Review of Systems   Review of Systems  Constitutional:  Negative for chills and fever.  Respiratory:  Negative for shortness of breath.   Cardiovascular:  Negative for chest pain.  Gastrointestinal:  Positive for diarrhea, nausea and vomiting.  Musculoskeletal:  Positive for arthralgias.  Neurological:  Positive for light-headedness and headaches.  See HPI  Physical Exam Updated Vital Signs BP (!) 151/101 (BP Location: Right Arm)    Pulse (!) 111    Temp 98.5 F (36.9 C) (Oral)    Resp (!) 24    Ht 5\' 3"  (1.6 m)    Wt 127 kg    SpO2 96%    BMI 49.60 kg/m  Physical Exam Vitals and nursing note reviewed.  Constitutional:      General: She is not in acute distress.    Appearance: Normal appearance.  She is not ill-appearing.  HENT:     Head: Normocephalic and atraumatic.  Eyes:     General: No scleral icterus.    Extraocular Movements: Extraocular movements intact.     Conjunctiva/sclera: Conjunctivae normal.     Comments: Dilated pupils  Cardiovascular:     Rate and Rhythm: Normal rate and regular rhythm.     Heart sounds: No murmur heard. Pulmonary:     Effort: Pulmonary effort is normal. No respiratory distress.     Breath sounds: No wheezing.  Musculoskeletal:        General: Tenderness (Lateral tenderness to the right knee and mild tenderness to palpation of the patellae bilaterally) present. No deformity or signs of injury.     Right lower leg: No edema.     Left lower leg: No edema.  Skin:    General: Skin is warm and dry.     Findings: No rash.  Neurological:     General: No focal deficit present.     Mental Status: She is alert.     Cranial Nerves: No cranial nerve deficit.     Motor: No weakness.     Coordination: Coordination normal.     Gait: Gait normal.     Comments: Ambulated to the bathroom  Psychiatric:        Mood and Affect: Mood normal.    ED Results / Procedures / Treatments   Labs (all labs ordered are listed, but only abnormal results are displayed) Labs Reviewed  BASIC METABOLIC PANEL - Abnormal; Notable for the following components:      Result Value   Potassium 3.3 (*)    Glucose, Bld 102 (*)    All other components within normal limits  RESP PANEL BY RT-PCR (FLU A&B, COVID) ARPGX2  CBC WITH DIFFERENTIAL/PLATELET    EKG None  Radiology CT Thoracic Spine Wo Contrast  Result Date: 05/23/2021 CLINICAL DATA:  Mid-back pain, compression fracture suspected; Low back pain, increased fracture risk EXAM: CT THORACIC AND LUMBAR SPINE WITHOUT CONTRAST TECHNIQUE: Multidetector CT imaging of the thoracic and lumbar spine was performed without contrast. Multiplanar CT image reconstructions were also generated. RADIATION DOSE REDUCTION: This exam  was performed according to the departmental dose-optimization program which includes automated exposure control, adjustment of the mA and/or kV according to patient size and/or use of iterative reconstruction technique. COMPARISON:  Correlation made with CT chest September 2022 FINDINGS: CT THORACIC SPINE FINDINGS Alignment: No significant listhesis. There is focal kyphosis at T11-T12. Vertebrae: Partial fusion across the T11-T12 disc space and fusion of the facets at this level. No acute fracture. Paraspinal and other soft tissues: Unremarkable. Disc levels: No significant osseous encroachment on the spinal canal or neural  foramina. Mild left T10-T11 facet hypertrophy. CT LUMBAR SPINE FINDINGS Segmentation: 5 lumbar type vertebrae. Alignment: Preserved. Vertebrae: Vertebral body heights are maintained. No acute fracture. Paraspinal and other soft tissues: Unremarkable. Disc levels: Intervertebral disc heights are maintained. Small disc bulge and mild facet hypertrophy at L5-S1. IMPRESSION: No fracture or other acute abnormality.  Mild degenerative changes. Electronically Signed   By: Guadlupe Spanish M.D.   On: 05/23/2021 15:47   CT Lumbar Spine Wo Contrast  Result Date: 05/23/2021 CLINICAL DATA:  Mid-back pain, compression fracture suspected; Low back pain, increased fracture risk EXAM: CT THORACIC AND LUMBAR SPINE WITHOUT CONTRAST TECHNIQUE: Multidetector CT imaging of the thoracic and lumbar spine was performed without contrast. Multiplanar CT image reconstructions were also generated. RADIATION DOSE REDUCTION: This exam was performed according to the departmental dose-optimization program which includes automated exposure control, adjustment of the mA and/or kV according to patient size and/or use of iterative reconstruction technique. COMPARISON:  Correlation made with CT chest September 2022 FINDINGS: CT THORACIC SPINE FINDINGS Alignment: No significant listhesis. There is focal kyphosis at T11-T12.  Vertebrae: Partial fusion across the T11-T12 disc space and fusion of the facets at this level. No acute fracture. Paraspinal and other soft tissues: Unremarkable. Disc levels: No significant osseous encroachment on the spinal canal or neural foramina. Mild left T10-T11 facet hypertrophy. CT LUMBAR SPINE FINDINGS Segmentation: 5 lumbar type vertebrae. Alignment: Preserved. Vertebrae: Vertebral body heights are maintained. No acute fracture. Paraspinal and other soft tissues: Unremarkable. Disc levels: Intervertebral disc heights are maintained. Small disc bulge and mild facet hypertrophy at L5-S1. IMPRESSION: No fracture or other acute abnormality.  Mild degenerative changes. Electronically Signed   By: Guadlupe Spanish M.D.   On: 05/23/2021 15:47   DG Knee Complete 4 Views Left  Result Date: 05/23/2021 CLINICAL DATA:  Pain after fall. EXAM: LEFT KNEE - COMPLETE 4+ VIEW COMPARISON:  None. FINDINGS: Mild medial compartment joint space narrowing and peripheral osteophytosis. Likely moderate patellofemoral joint space narrowing. No joint effusion. No acute fracture or dislocation. IMPRESSION: Mild medial compartment and mild-to-moderate moderate patellofemoral compartment osteoarthritis. Electronically Signed   By: Neita Garnet M.D.   On: 05/23/2021 15:40   DG Knee Complete 4 Views Right  Result Date: 05/23/2021 CLINICAL DATA:  Pain after fall.  Hit right knee. EXAM: RIGHT KNEE - COMPLETE 4+ VIEW COMPARISON:  None. FINDINGS: Minimal medial compartment joint space narrowing. Mild peripheral medial and lateral compartment osteophytosis. Moderate patellofemoral joint space narrowing and moderate superior patellar degenerative osteophytosis. Mild superior trochlear degenerative osteophytosis. No acute fracture. No joint effusion. IMPRESSION: Moderate patellofemoral and mild medial compartment osteoarthritis. Electronically Signed   By: Neita Garnet M.D.   On: 05/23/2021 15:37    Procedures Procedures    Medications Ordered in ED Medications  diazepam (VALIUM) tablet 5 mg (has no administration in time range)  HYDROmorphone (DILAUDID) tablet 2 mg (has no administration in time range)  ondansetron (ZOFRAN-ODT) disintegrating tablet 4 mg (has no administration in time range)  sodium chloride 0.9 % bolus 1,000 mL (has no administration in time range)    ED Course/ Medical Decision Making/ A&P                           Medical Decision Making Risk Prescription drug management.   Patient presents to the ED for concern of near syncope.  Differential includes but is not limited to ACS, PE, dehydration, cardiac arrhythmia, viral illness, orthostatic hypotension and anxiety.  Co morbidities that complicate the patient evaluation include: Uncontrolled anxiety, ongoing viral illness  Workup:  Lab Tests: I viewed the patient's labs and there were no pertinent results.  2. Imaging Studies ordered:  I reviewed patient's bilateral knee x-rays as well as her CT thoracic and lumbar spine.  I did not note any abnormalities outside of her known osteoarthritis.  Radiologist's report corroborates this and states that there is advanced degeneration.  4. Test Considered: D-dimer, however patient is low likelihood [place].   Treatment:  Medications: I ordered patient's home dose of Dilaudid and Valium for her discomfort.  She said that this did not help her when I reevaluated her.  She was then given morphine which helped her feel better.   Dispo:  Problem List / ED Course:  Viral illness, fall and extremity pain.  Dispostion:  After the interventions noted above, I reevaluated the patient and found that they are feeling better after the morphine.  She was able to ambulate in the department and endorses a scheduled appointment with her primary care provider on Monday.  I considered admission for her ongoing pain as well as CT imaging of her knees however this is not emergent.  She will  follow-up with her primary care provider on Monday.  Zofran and loperamide sent to her pharmacy for her GI symptoms.  Tessalon sent for her continued cough from her virus.  Final Clinical Impression(s) / ED Diagnoses Final diagnoses:  Fall, initial encounter    Rx / DC Orders ED Discharge Orders          Ordered    loperamide (IMODIUM) 2 MG capsule  4 times daily PRN        05/23/21 1633    benzonatate (TESSALON) 100 MG capsule  Every 8 hours        05/23/21 1633    ondansetron (ZOFRAN) 4 MG tablet  Every 6 hours        05/23/21 1633           Results and diagnoses were explained to the patient. Return precautions discussed in full. Patient had no additional questions and expressed complete understanding.   This chart was dictated using voice recognition software.  Despite best efforts to proofread,  errors can occur which can change the documentation meaning.    Woodroe Chen 05/23/21 Florestine Avers, MD 05/26/21 1021

## 2021-05-23 NOTE — ED Triage Notes (Signed)
Pt here via EMS from home. Pt had mechanical fall hitting right knee. Pt unable to get self up. Pt states she has history of back issues. Pt hollering and crying in triage. No obvious injuries noted.   156/94 HR 104 RR 20 99% RA

## 2021-05-23 NOTE — Discharge Instructions (Addendum)
Please make sure to keep your appointment with your primary care provider on Monday.  They are a good person to talk to about your pain from your fall but also to further help you find out potential causes of this fall.   They also will be a good resource to have as you try and get over the virus that has been affecting you the past 2 weeks.  The medication for diarrhea, nausea and vomiting are at the pharmacy.  Additionally, the cough medication we discussed is at the pharmacy.  Please return with any worsening symptoms, especially severe headache, visual changes, chest pain, shortness of breath, decreased sensation in your legs and bowel or bladder dysfunction.

## 2021-05-23 NOTE — ED Notes (Signed)
Patient chewed up PO meds, refused PO water at this time.

## 2021-06-22 ENCOUNTER — Encounter (HOSPITAL_COMMUNITY): Payer: Self-pay | Admitting: Emergency Medicine

## 2021-06-22 ENCOUNTER — Emergency Department (HOSPITAL_COMMUNITY): Payer: BC Managed Care – PPO

## 2021-06-22 ENCOUNTER — Observation Stay (HOSPITAL_COMMUNITY)
Admission: EM | Admit: 2021-06-22 | Discharge: 2021-06-24 | Disposition: A | Discharge status: Home Health | Payer: BC Managed Care – PPO | Attending: Internal Medicine | Admitting: Internal Medicine

## 2021-06-22 ENCOUNTER — Other Ambulatory Visit: Payer: Self-pay

## 2021-06-22 DIAGNOSIS — Z23 Encounter for immunization: Secondary | ICD-10-CM | POA: Diagnosis not present

## 2021-06-22 DIAGNOSIS — Z6841 Body Mass Index (BMI) 40.0 and over, adult: Secondary | ICD-10-CM | POA: Diagnosis present

## 2021-06-22 DIAGNOSIS — Z8679 Personal history of other diseases of the circulatory system: Secondary | ICD-10-CM | POA: Diagnosis not present

## 2021-06-22 DIAGNOSIS — Z79899 Other long term (current) drug therapy: Secondary | ICD-10-CM | POA: Insufficient documentation

## 2021-06-22 DIAGNOSIS — H5461 Unqualified visual loss, right eye, normal vision left eye: Secondary | ICD-10-CM | POA: Diagnosis present

## 2021-06-22 DIAGNOSIS — G43901 Migraine, unspecified, not intractable, with status migrainosus: Secondary | ICD-10-CM

## 2021-06-22 DIAGNOSIS — Z91040 Latex allergy status: Secondary | ICD-10-CM | POA: Diagnosis not present

## 2021-06-22 DIAGNOSIS — G894 Chronic pain syndrome: Secondary | ICD-10-CM | POA: Diagnosis not present

## 2021-06-22 DIAGNOSIS — H547 Unspecified visual loss: Secondary | ICD-10-CM

## 2021-06-22 DIAGNOSIS — R519 Headache, unspecified: Secondary | ICD-10-CM | POA: Diagnosis not present

## 2021-06-22 DIAGNOSIS — N179 Acute kidney failure, unspecified: Secondary | ICD-10-CM | POA: Diagnosis not present

## 2021-06-22 DIAGNOSIS — F251 Schizoaffective disorder, depressive type: Secondary | ICD-10-CM

## 2021-06-22 DIAGNOSIS — F32A Depression, unspecified: Secondary | ICD-10-CM | POA: Diagnosis not present

## 2021-06-22 DIAGNOSIS — F259 Schizoaffective disorder, unspecified: Secondary | ICD-10-CM | POA: Diagnosis present

## 2021-06-22 DIAGNOSIS — Z20822 Contact with and (suspected) exposure to covid-19: Secondary | ICD-10-CM | POA: Diagnosis not present

## 2021-06-22 DIAGNOSIS — I10 Essential (primary) hypertension: Secondary | ICD-10-CM | POA: Diagnosis not present

## 2021-06-22 DIAGNOSIS — Z9151 Personal history of suicidal behavior: Secondary | ICD-10-CM | POA: Insufficient documentation

## 2021-06-22 DIAGNOSIS — G932 Benign intracranial hypertension: Secondary | ICD-10-CM | POA: Diagnosis not present

## 2021-06-22 DIAGNOSIS — Z0189 Encounter for other specified special examinations: Secondary | ICD-10-CM

## 2021-06-22 DIAGNOSIS — G8929 Other chronic pain: Secondary | ICD-10-CM | POA: Diagnosis present

## 2021-06-22 LAB — APTT: aPTT: 30 seconds (ref 24–36)

## 2021-06-22 LAB — COMPREHENSIVE METABOLIC PANEL
ALT: 28 U/L (ref 0–44)
AST: 47 U/L — ABNORMAL HIGH (ref 15–41)
Albumin: 3.6 g/dL (ref 3.5–5.0)
Alkaline Phosphatase: 101 U/L (ref 38–126)
Anion gap: 9 (ref 5–15)
BUN: 10 mg/dL (ref 6–20)
CO2: 26 mmol/L (ref 22–32)
Calcium: 8.7 mg/dL — ABNORMAL LOW (ref 8.9–10.3)
Chloride: 104 mmol/L (ref 98–111)
Creatinine, Ser: 1.32 mg/dL — ABNORMAL HIGH (ref 0.44–1.00)
GFR, Estimated: 50 mL/min — ABNORMAL LOW (ref >60)
Glucose, Bld: 119 mg/dL — ABNORMAL HIGH (ref 70–99)
Potassium: 4 mmol/L (ref 3.5–5.1)
Sodium: 139 mmol/L (ref 135–145)
Total Bilirubin: 0.7 mg/dL (ref 0.3–1.2)
Total Protein: 7 g/dL (ref 6.5–8.1)

## 2021-06-22 LAB — CBC
HCT: 41.2 % (ref 36.0–46.0)
Hemoglobin: 13 g/dL (ref 12.0–15.0)
MCH: 31.2 pg (ref 26.0–34.0)
MCHC: 31.6 g/dL (ref 30.0–36.0)
MCV: 98.8 fL (ref 80.0–100.0)
Platelets: 241 10*3/uL (ref 150–400)
RBC: 4.17 MIL/uL (ref 3.87–5.11)
RDW: 14.6 % (ref 11.5–15.5)
WBC: 8.1 10*3/uL (ref 4.0–10.5)
nRBC: 0 % (ref 0.0–0.2)

## 2021-06-22 LAB — RESP PANEL BY RT-PCR (FLU A&B, COVID) ARPGX2
Influenza A by PCR: NEGATIVE
Influenza B by PCR: NEGATIVE
SARS Coronavirus 2 by RT PCR: NEGATIVE

## 2021-06-22 LAB — ETHANOL: Alcohol, Ethyl (B): <10 mg/dL (ref <10)

## 2021-06-22 LAB — DIFFERENTIAL
Abs Immature Granulocytes: 0.06 10*3/uL (ref 0.00–0.07)
Basophils Absolute: 0.1 10*3/uL (ref 0.0–0.1)
Basophils Relative: 1 %
Eosinophils Absolute: 0.4 10*3/uL (ref 0.0–0.5)
Eosinophils Relative: 4 %
Immature Granulocytes: 1 %
Lymphocytes Relative: 28 %
Lymphs Abs: 2.3 10*3/uL (ref 0.7–4.0)
Monocytes Absolute: 0.6 10*3/uL (ref 0.1–1.0)
Monocytes Relative: 7 %
Neutro Abs: 4.7 10*3/uL (ref 1.7–7.7)
Neutrophils Relative %: 59 %

## 2021-06-22 LAB — I-STAT BETA HCG BLOOD, ED (MC, WL, AP ONLY): I-stat hCG, quantitative: <5 m[IU]/mL (ref <5)

## 2021-06-22 LAB — PROTIME-INR
INR: 1.1 (ref 0.8–1.2)
Prothrombin Time: 13.9 seconds (ref 11.4–15.2)

## 2021-06-22 MED ORDER — INFLUENZA VAC SPLIT QUAD 0.5 ML IM SUSY
0.5000 mL | PREFILLED_SYRINGE | INTRAMUSCULAR | Status: AC
  Administered 2021-06-23: 0.5 mL via INTRAMUSCULAR
  Filled 2021-06-22: qty 0.5

## 2021-06-22 MED ORDER — DULOXETINE HCL 60 MG PO CPEP
60.0000 mg | ORAL_CAPSULE | Freq: Three times a day (TID) | ORAL | Status: DC
  Administered 2021-06-22 – 2021-06-24 (×6): 60 mg via ORAL
  Filled 2021-06-22 (×6): qty 1

## 2021-06-22 MED ORDER — KETOROLAC TROMETHAMINE 15 MG/ML IJ SOLN
15.0000 mg | Freq: Once | INTRAMUSCULAR | Status: AC
  Administered 2021-06-22: 15 mg via INTRAVENOUS
  Filled 2021-06-22: qty 1

## 2021-06-22 MED ORDER — METOCLOPRAMIDE HCL 5 MG/ML IJ SOLN
10.0000 mg | Freq: Once | INTRAMUSCULAR | Status: AC
  Administered 2021-06-22: 10 mg via INTRAVENOUS
  Filled 2021-06-22: qty 2

## 2021-06-22 MED ORDER — IOHEXOL 350 MG/ML SOLN
75.0000 mL | Freq: Once | INTRAVENOUS | Status: AC | PRN
  Administered 2021-06-22: 75 mL via INTRAVENOUS

## 2021-06-22 MED ORDER — CLONAZEPAM 0.5 MG PO TABS
1.0000 mg | ORAL_TABLET | Freq: Three times a day (TID) | ORAL | Status: DC
  Administered 2021-06-22 – 2021-06-24 (×6): 1 mg via ORAL
  Filled 2021-06-22 (×6): qty 2

## 2021-06-22 MED ORDER — METOCLOPRAMIDE HCL 5 MG/ML IJ SOLN: 10.0000 mg | INTRAMUSCULAR | Status: DC | PRN

## 2021-06-22 MED ORDER — SODIUM CHLORIDE 0.9 % IV SOLN
25.0000 mg | Freq: Four times a day (QID) | INTRAVENOUS | Status: DC | PRN
  Filled 2021-06-22: qty 1

## 2021-06-22 MED ORDER — VERAPAMIL HCL ER 180 MG PO TBCR
180.0000 mg | EXTENDED_RELEASE_TABLET | Freq: Every day | ORAL | Status: DC
  Administered 2021-06-23 – 2021-06-24 (×2): 180 mg via ORAL
  Filled 2021-06-22 (×2): qty 1

## 2021-06-22 MED ORDER — DIPHENHYDRAMINE HCL 50 MG/ML IJ SOLN
25.0000 mg | Freq: Once | INTRAMUSCULAR | Status: AC
  Administered 2021-06-22: 25 mg via INTRAVENOUS
  Filled 2021-06-22: qty 1

## 2021-06-22 MED ORDER — LORAZEPAM 2 MG/ML IJ SOLN
0.5000 mg | Freq: Once | INTRAMUSCULAR | Status: AC
Start: 2021-06-22 — End: 2021-06-23
  Administered 2021-06-23: 0.5 mg via INTRAVENOUS
  Filled 2021-06-22: qty 1

## 2021-06-22 MED ORDER — SODIUM CHLORIDE 0.9 % IV BOLUS
1000.0000 mL | Freq: Once | INTRAVENOUS | Status: AC
  Administered 2021-06-22: 1000 mL via INTRAVENOUS

## 2021-06-22 MED ORDER — SODIUM CHLORIDE 0.9 % IV SOLN
500.0000 mg | Freq: Once | INTRAVENOUS | Status: AC
  Administered 2021-06-22: 500 mg via INTRAVENOUS
  Filled 2021-06-22: qty 4

## 2021-06-22 MED ORDER — GABAPENTIN 400 MG PO CAPS
800.0000 mg | ORAL_CAPSULE | Freq: Three times a day (TID) | ORAL | Status: DC
  Administered 2021-06-22 – 2021-06-24 (×6): 800 mg via ORAL
  Filled 2021-06-22 (×6): qty 2

## 2021-06-22 MED ORDER — MIRTAZAPINE 30 MG PO TBDP
30.0000 mg | ORAL_TABLET | Freq: Every day | ORAL | Status: DC
  Administered 2021-06-22 – 2021-06-23 (×2): 30 mg via ORAL
  Filled 2021-06-22 (×3): qty 1

## 2021-06-22 MED ORDER — QUETIAPINE FUMARATE 100 MG PO TABS
100.0000 mg | ORAL_TABLET | Freq: Three times a day (TID) | ORAL | Status: DC
  Administered 2021-06-22 – 2021-06-24 (×6): 100 mg via ORAL
  Filled 2021-06-22 (×6): qty 1

## 2021-06-22 MED ORDER — DIPHENHYDRAMINE HCL 50 MG/ML IJ SOLN
25.0000 mg | INTRAMUSCULAR | Status: DC | PRN
  Administered 2021-06-24: 25 mg via INTRAVENOUS
  Filled 2021-06-22: qty 1

## 2021-06-22 MED ORDER — TIZANIDINE HCL 4 MG PO TABS
4.0000 mg | ORAL_TABLET | Freq: Three times a day (TID) | ORAL | Status: DC
  Administered 2021-06-22 – 2021-06-24 (×6): 4 mg via ORAL
  Filled 2021-06-22 (×6): qty 1

## 2021-06-22 MED ORDER — HYDROXYZINE HCL 25 MG PO TABS
100.0000 mg | ORAL_TABLET | Freq: Every day | ORAL | Status: DC
  Administered 2021-06-22 – 2021-06-23 (×2): 100 mg via ORAL
  Filled 2021-06-22 (×2): qty 4

## 2021-06-22 MED ORDER — HYDROMORPHONE HCL 2 MG PO TABS
2.0000 mg | ORAL_TABLET | Freq: Two times a day (BID) | ORAL | Status: DC
  Administered 2021-06-22 – 2021-06-24 (×4): 2 mg via ORAL
  Filled 2021-06-22 (×4): qty 1

## 2021-06-22 NOTE — Progress Notes (Signed)
Code Stroke Activated @1830 . LKWT originally said to be 1745 then patient said she has had a headache x 2 days. mRS 0. Dr. paged @1836 . Dr. Wilford Corner on camera @1838 . NIH 1. Dr. has reviewed all imaging and is aware of results.  ?

## 2021-06-22 NOTE — ED Provider Notes (Signed)
?[place] DEPT ?Provider Note ? ? ?CSN: [id number] ?Arrival date & time: 06/22/21  1752 ? ?  ? ?History ? ?Chief Complaint  ?Patient presents with  ? Headache  ? Loss of Vision  ? ? ?Audrey [name] is a 46 y.o. female. ? ?HPI ? ? 46 y/o female with a h/o anxiety, arthritis, depression, heart murmur, pseudotumor cerebri, PTSD, schizoaffective schizophrenia, sciatica, who presents to the ED today for eval of headache and vision loss. Pt states she has had a headache for the last several days. 45 min pta at about 545pm she reports vision loss to the right eye. She further reports some tremors to her bilat extremities. Reports photosensitivity as well.  ? ?Home Medications ?Prior to Admission medications   ?Medication Sig Start Date End Date Taking? Authorizing Provider  ?albuterol (VENTOLIN HFA) 108 (90 Base) MCG/ACT inhaler Inhale 2 puffs into the lungs every 6 (six) hours as needed for wheezing or shortness of breath.   Yes [provider]  ?butalbital-acetaminophen-caffeine (FIORICET) 50-325-40 MG tablet Take 1 tablet by mouth See admin instructions. Take 1 tablet by mouth as needed for migraine and may repeat in 2 hours, if no relief 07/21/18  Yes [provider]  ?clonazePAM (KLONOPIN) 1 MG tablet Take 1 mg by mouth 3 (three) times daily.   Yes [provider]  ?diazepam (VALIUM) 5 MG tablet Take 5 mg by mouth See admin instructions. Take 5 mg by mouth in the morning and an additional 5 mg once a day as needed for anxiety 03/19/21  Yes [provider]  ?DULoxetine (CYMBALTA) 60 MG capsule Take 60 mg by mouth in the morning, at noon, and at bedtime.   Yes [provider]  ?gabapentin (NEURONTIN) 800 MG tablet Take 800 mg by mouth 3 (three) times daily.   Yes [provider]  ?HYDROmorphone (DILAUDID) 2 MG tablet Take 2 mg by mouth See admin instructions. Take 2 mg by mouth at 2 AM, 8 AM, 2 PM, and 8 PM 05/06/21  Yes [provider]  ?hydrOXYzine (VISTARIL) 100 MG capsule Take 100 mg by mouth at bedtime.   Yes [provider]  ?lidocaine (LIDODERM) 5 % Place 1 patch onto the skin See admin instructions. Apply 1 patch to the lower back once a day- Remove & Discard patch within 12 hours or as directed by MD   Yes [provider]  ?loperamide (IMODIUM) 2 MG capsule Take 1 capsule (2 mg total) by mouth 4 (four) times daily as needed for diarrhea or loose stools. 05/23/21  Yes Redwine, [place] A, PA-C  ?losartan (COZAAR) 25 MG tablet Take 25 mg by mouth daily.   Yes [provider]  ?mirtazapine (REMERON SOL-TAB) 15 MG disintegrating tablet Take 30 mg by mouth at bedtime.   Yes [provider]  ?naloxone (NARCAN) nasal spray 4 mg/0.1 mL Place 1 spray into the nose once as needed (for accidental overdose). 02/26/21  Yes [provider]  ?ondansetron (ZOFRAN) 4 MG tablet Take 4 mg by mouth every 8 (eight) hours as needed for nausea or vomiting.   Yes [provider]  ?QUEtiapine (SEROQUEL) 100 MG tablet Take 100 mg by mouth 3 (three) times daily.   Yes [provider]  ?tiZANidine (ZANAFLEX) 4 MG tablet Take 1 tablet (4 mg total) by mouth every 12 (twelve) hours as needed for muscle spasms. ?Patient taking differently: Take 4 mg by mouth 3 (three) times daily. 11/30/20  Yes McDonald,  Mia A, PA-C  ?verapamil (VERELAN PM) 180 MG 24 hr capsule Take 180 mg by mouth daily.   Yes [provider]  ?zolpidem (AMBIEN) 10 MG tablet Take 10 mg by mouth at bedtime. 03/19/21  Yes [provider]  ?acetaZOLAMIDE (DIAMOX) 125 MG tablet Take 1 tablet (125 mg total) by mouth 2 (two) times daily. ?Patient not taking: Reported on 06/22/2021 01/08/21   Dorie Rank, MD  ?benzonatate (TESSALON) 100 MG capsule Take 1 capsule (100 mg total) by mouth every 8 (eight) hours. ?Patient not taking: Reported on 06/22/2021 05/23/21   Redwine, [place], PA-C  ?Lidocaine (HM LIDOCAINE PATCH) 4 %  PTCH Apply 1 patch topically daily. ?Patient not taking: Reported on 06/22/2021 11/30/20   Joline Maxcy A, PA-C  ?   ? ?Allergies    ?Penicillins, Prochlorperazine, Acetazolamide, Latex, Propoxyphene, and Nitroglycerin er   ? ?Review of Systems   ?Review of Systems ?See HPI for pertinent positives or negatives. ? ? ?Physical Exam ?Updated Vital Signs ?BP (!) 135/96   Pulse (!) 105   Temp 97.8 ?F (36.6 ?C) (Oral)   Resp 17   SpO2 96%  ?Physical Exam ?Vitals and nursing note reviewed.  ?Constitutional:   ?   General: She is not in acute distress. ?   Appearance: She is well-developed.  ?HENT:  ?   Head: Normocephalic and atraumatic.  ?Eyes:  ?   Conjunctiva/sclera: Conjunctivae normal.  ?Cardiovascular:  ?   Rate and Rhythm: Normal rate and regular rhythm.  ?   Heart sounds: No murmur heard. ?Pulmonary:  ?   Effort: Pulmonary effort is normal. No respiratory distress.  ?   Breath sounds: Normal breath sounds.  ?Abdominal:  ?   Palpations: Abdomen is soft.  ?   Tenderness: There is no abdominal tenderness.  ?Musculoskeletal:     ?   General: No swelling.  ?   Cervical back: Neck supple.  ?Skin: ?   General: Skin is warm and dry.  ?   Capillary Refill: Capillary refill takes less than 2 seconds.  ?Neurological:  ?   Mental Status: She is alert.  ?   Comments: Mental Status:  ?Alert, thought content appropriate, able to give a coherent history. Speech fluent without evidence of aphasia. Able to follow 2 step commands without difficulty.  ?Cranial Nerves:  ?II:  pupils equal, round, reactive to light, pupils are dilated ?III,IV, VI: ptosis not present, extra-ocular motions intact bilaterally  ?V,VII: smile symmetric, decreased sensation to the right side of the face ?VIII: hearing grossly normal to voice  ?X: uvula elevates symmetrically  ?XI: bilateral shoulder shrug symmetric and strong ?XII: midline tongue extension without fassiculations ?Motor:  ?Normal tone. 5/5 strength of BUE and BLE major muscle groups   ?Cerebellar: normal finger-to-nose with bilateral upper extremities ?Sensation: decreased sensation to the RLE ?+ pronator drift to the RUE ?  ?Psychiatric:     ?   Mood and Affect: Mood normal.  ? ? ? ?ED Results / Procedures / Treatments   ?Labs ?(all labs ordered are listed, but only abnormal results are displayed) ?Labs Reviewed  ?COMPREHENSIVE METABOLIC PANEL - Abnormal; Notable for the following components:  ?    Result Value  ? Glucose, Bld 119 (*)   ? Creatinine, Ser 1.32 (*)   ? Calcium 8.7 (*)   ? AST 47 (*)   ? GFR, Estimated 50 (*)   ? All other components within normal limits  ?RESP PANEL BY RT-PCR (FLU A&B,  COVID) ARPGX2  ?ETHANOL  ?PROTIME-INR  ?APTT  ?CBC  ?DIFFERENTIAL  ?RAPID URINE DRUG SCREEN, HOSP PERFORMED  ?URINALYSIS, ROUTINE W REFLEX MICROSCOPIC  ?I-STAT CHEM 8, ED  ?I-STAT BETA HCG BLOOD, ED ([facility], [facility], [facility] ONLY)  ? ? ?EKG ?None ? ?Radiology ?CT VENOGRAM HEAD ? ?Result Date: 06/22/2021 ?CLINICAL DATA:  Headache common dural venous sinus thrombosis suspected EXAM: CT VENOGRAM HEAD TECHNIQUE: Venographic phase images of the brain were obtained following the administration of intravenous contrast. Multiplanar reformats and maximum intensity projections were generated. RADIATION DOSE REDUCTION: This exam was performed according to the departmental dose-optimization program which includes automated exposure control, adjustment of the mA and/or kV according to patient size and/or use of iterative reconstruction technique. CONTRAST:  45mL OMNIPAQUE IOHEXOL 350 MG/ML SOLN COMPARISON:  Same-day CT head FINDINGS: The superior sagittal sinus, internal cerebral veins, vein of Galen, straight sinus, transverse sinuses, sigmoid sinuses, and jugular bulbs are patent without evidence of thrombus or significant stenosis. IMPRESSION: No evidence of venous sinus thrombosis. Electronically Signed   By: Valetta Mole M.D.   On: 06/22/2021 19:33  ? ?CT HEAD CODE STROKE WO CONTRAST ? ?Result Date: 06/22/2021 ?CLINICAL  DATA:  Code stroke.  Headache, vision loss EXAM: CT HEAD WITHOUT CONTRAST TECHNIQUE: Contiguous axial images were obtained from the base of the skull through the vertex without intravenous contrast. RADIATION DOSE R

## 2021-06-22 NOTE — Assessment & Plan Note (Addendum)
Creatinine elevated to 1.32 from prior of 0.97.   ?Improved with IV hydration.  Monitor. ?

## 2021-06-22 NOTE — Assessment & Plan Note (Addendum)
-  Continue home Dilaudid ?

## 2021-06-22 NOTE — ED Notes (Signed)
Patient transported to CT 

## 2021-06-22 NOTE — Assessment & Plan Note (Addendum)
History of self-harm/suicidal attempt in the past.  No current SI. ?- Continue home mirtazapine, Seroquel, clonazepam ?

## 2021-06-22 NOTE — H&P (Addendum)
?History and Physical  ? ? ?Patient: Audrey [name] OYD:[id number] DOB: 12/17/1975 ?DOA: 06/22/2021 ?DOS: the patient was seen and examined on 06/23/2021 ?PCP: Nathaneil Canary, PA-C  ?Patient coming from: Home ? ?Chief Complaint:  ?Chief Complaint  ?Patient presents with  ? Headache  ? Loss of Vision  ? ?HPI: Audrey [name] is a 46 y.o. female with medical history significant of pseudotumor cerebri, hypertension, schizoaffective schizophrenia, depression, chronic pain syndrome who presents with right vision loss. ? ?Patient awoke this morning with a headache and then noted right nearly complete vision loss about 30 minutes prior to arrival to the ED.  Patient has history of pseudotumor cerebri and usually only sees floaters.  She also reports bilateral lower extremity weakness.  Also has tingling of her feet and fingers.  Notes her left eye is also starting to become blurry. ?She usually requires recurrent therapeutic LP for her pseudotumor cerebri anywhere from 6 to 12 months.  Last LP was done in [place] about 9 months ago.  She recently moved to [place] 2 months ago and has not yet established with a neurologist. ? ?In the ED, she was tachycardic normotensive on room air.  CBC was unremarkable.  BMP showed mildly elevated creatinine of 1.32 from a prior 0.97. ? ?She initially presented as a code stroke and was evaluated virtually by neurologist Dr. Wilford Corner.  She had extensive work-up in the ED that was negative including CT head, CTA head and neck and CT venogram.  Ultimately neurology suspect that her symptoms are due to pseudotumor cerebri but unfortunately patient cannot get LP at bedside due to extensive scar tissue.  Hospitalist called for admission to arrange for fluoroscopic LP in the morning. ? ?Review of Systems: As mentioned in the history of present illness. All other systems reviewed and are negative. ?Past Medical History:  ?Diagnosis Date  ? Anxiety   ? Arthritis   ? Depression   ?  Heart murmur   ? Pseudotumor cerebri   ? PTSD (post-traumatic stress disorder)   ? Schizo affective schizophrenia (HCC)   ? Sciatica   ? ?Past Surgical History:  ?Procedure Laterality Date  ? ABDOMINAL HYSTERECTOMY    ? ABDOMINAL SURGERY    ? APPENDECTOMY    ? CARDIAC CATHETERIZATION    ? CHOLECYSTECTOMY    ? KNEE SURGERY    ? TONSILLECTOMY    ? TUBAL LIGATION    ? ?Social History:  reports that she has never smoked. She has never used smokeless tobacco. She reports that she does not drink alcohol and does not use drugs. ? ?Allergies  ?Allergen Reactions  ? Penicillins Anaphylaxis and Rash  ? Prochlorperazine Anaphylaxis and Nausea And Vomiting  ? Acetazolamide Other (See Comments)  ?  Joint pain  ? Latex Hives and Swelling  ? Propoxyphene Nausea And Vomiting  ? Nitroglycerin Er Swelling and Other (See Comments)  ?  Body swells, but no breathing issues  ? ? ?History reviewed. No pertinent family history. ? ?Prior to Admission medications   ?Medication Sig Start Date End Date Taking? Authorizing Provider  ?albuterol (VENTOLIN HFA) 108 (90 Base) MCG/ACT inhaler Inhale 2 puffs into the lungs every 6 (six) hours as needed for wheezing or shortness of breath.   Yes [provider]  ?butalbital-acetaminophen-caffeine (FIORICET) 50-325-40 MG tablet Take 1 tablet by mouth See admin instructions. Take 1 tablet by mouth as needed for migraine and may repeat in 2 hours, if no relief 07/21/18  Yes [provider]  ?clonazePAM (KLONOPIN) 1 MG tablet Take 1 mg by mouth 3 (three) times daily.   Yes [provider]  ?diazepam (VALIUM) 5 MG tablet Take 5 mg by mouth See admin instructions. Take 5 mg by mouth in the morning and an additional 5 mg once a day as needed for anxiety 03/19/21  Yes [provider]  ?DULoxetine (CYMBALTA) 60 MG capsule Take 60 mg by mouth in the morning, at noon, and at bedtime.   Yes [provider]  ?gabapentin (NEURONTIN) 800 MG tablet Take 800 mg by mouth 3  (three) times daily.   Yes [provider]  ?HYDROmorphone (DILAUDID) 2 MG tablet Take 2 mg by mouth See admin instructions. Take 2 mg by mouth at 2 AM, 8 AM, 2 PM, and 8 PM 05/06/21  Yes [provider]  ?hydrOXYzine (VISTARIL) 100 MG capsule Take 100 mg by mouth at bedtime.   Yes [provider]  ?lidocaine (LIDODERM) 5 % Place 1 patch onto the skin See admin instructions. Apply 1 patch to the lower back once a day- Remove & Discard patch within 12 hours or as directed by MD   Yes [provider]  ?loperamide (IMODIUM) 2 MG capsule Take 1 capsule (2 mg total) by mouth 4 (four) times daily as needed for diarrhea or loose stools. 05/23/21  Yes Redwine, Madison A, PA-C  ?losartan (COZAAR) 25 MG tablet Take 25 mg by mouth daily.   Yes [provider]  ?mirtazapine (REMERON SOL-TAB) 15 MG disintegrating tablet Take 30 mg by mouth at bedtime.   Yes [provider]  ?naloxone (NARCAN) nasal spray 4 mg/0.1 mL Place 1 spray into the nose once as needed (for accidental overdose). 02/26/21  Yes [provider]  ?ondansetron (ZOFRAN) 4 MG tablet Take 4 mg by mouth every 8 (eight) hours as needed for nausea or vomiting.   Yes [provider]  ?QUEtiapine (SEROQUEL) 100 MG tablet Take 100 mg by mouth 3 (three) times daily.   Yes [provider]  ?tiZANidine (ZANAFLEX) 4 MG tablet Take 1 tablet (4 mg total) by mouth every 12 (twelve) hours as needed for muscle spasms. ?Patient taking differently: Take 4 mg by mouth 3 (three) times daily. 11/30/20  Yes McDonald, Mia A, PA-C  ?verapamil (VERELAN PM) 180 MG 24 hr capsule Take 180 mg by mouth daily.   Yes [provider]  ?zolpidem (AMBIEN) 10 MG tablet Take 10 mg by mouth at bedtime. 03/19/21  Yes [provider]  ?acetaZOLAMIDE (DIAMOX) 125 MG tablet Take 1 tablet (125 mg total) by mouth 2 (two) times daily. ?Patient not taking: Reported on 06/22/2021 01/08/21   Linwood DibblesKnapp, Jon, MD   ?benzonatate (TESSALON) 100 MG capsule Take 1 capsule (100 mg total) by mouth every 8 (eight) hours. ?Patient not taking: Reported on 06/22/2021 05/23/21   Redwine, Madison A, PA-C  ?Lidocaine (HM LIDOCAINE PATCH) 4 % PTCH Apply 1 patch topically daily. ?Patient not taking: Reported on 06/22/2021 11/30/20   Frederik PearMcDonald, Mia A, PA-C  ? ? ?Physical Exam: ?Vitals:  ? 06/22/21 2015 06/22/21 2030 06/22/21 2218 06/22/21 2352  ?BP: (!) 146/88 (!) 135/96 136/81   ?Pulse: (!) 110 (!) 105 (!) 104   ?Resp: 17 17 18    ?Temp:   98 ?F (36.7 ?C)   ?TempSrc:   Oral   ?SpO2: 97% 96% 96%   ?Weight:    135.3 kg  ?Height:    5\' 3"  (1.6 m)  ? ?Constitutional: NAD,  calm, comfortable, obese female laying flat in bed looking at her cell phone ?Eyes: PERRL, lids and conjunctivae normal ?ENMT: Mucous membranes are moist.  ?Neck: normal, supple ?Respiratory: clear to auscultation bilaterally, no wheezing, no crackles. Normal respiratory effort. No accessory muscle use.  ?Cardiovascular: Regular rate and rhythm, no murmurs / rubs / gallops. No extremity edema. 2+ pedal pulses.  ?Abdomen: no tenderness, no masses palpated.  Bowel sounds positive.  ?Musculoskeletal: no clubbing / cyanosis. No joint deformity upper and lower extremities. Good ROM, no contractures. Normal muscle tone.  ?Skin: Healing patterned superficial abrasions on left forearm-patient reports from history of self-harm ?Neurologic: CN 2-12 grossly intact. Strength 5/5 in all 4.  Equal smile but had involuntary tremors of the maxillary facial region.  Equal shoulder shrug. ?Psychiatric: Normal judgment and insight. Alert and oriented x 3. Normal mood. ?Data Reviewed: ? ?See HPI ? ?Assessment and Plan: ?* Vision loss ?Hx of pseudotumor cerebri ?Awoke with headache this morning and right vision loss 30 minutes prior to being evaluated in the ED.  She has been seen by neurology and has had extensive imaging. ?-Negative CT head, CTA head and neck and CT venogram ?-Will obtain MRI brain  with and without contrast for stroke rule out ?-Neurology suspects this is most likely due to her pseudotumor cerebri, patient unable to get LP at bedside due to scar tissue from long hx of requiring LP in the past. Will

## 2021-06-22 NOTE — Assessment & Plan Note (Signed)
-   Continue home verapamil, hold losartan ? ?

## 2021-06-22 NOTE — ED Triage Notes (Signed)
Pt comes in with severe headache complaint and states she has lost vision in her right eye about 30 min ago. Nausea, no vomiting. States she has "about 13 different migraines and hx of pseudo tumor cerebri"  Pt also endorses leg weakness.  ?

## 2021-06-22 NOTE — Consult Note (Signed)
[facility] Telemedicine Consult ? ? ?Requesting Provider: Norman ClayJoshua Hong, MD ?Consult Participants: Dr. Marthe PatchA. Linette Gunderson, Telespecialist RN Misty   bedside RN- Larita FifeMadina ?Location of the provider: Home ?[facility] the [facility] ? ?This consult was provided via telemedicine with 2-way video and audio communication. The patient/family was informed that care would be provided in this way and agreed to receive care in this manner.  ? ? ?Chief Complaint: Right eye vision loss, severe headache and neck pain ? ?HPI: 46 year old with a past medical history of PTSD, pseudotumor cerebri, migraine headaches, schizoaffective schizophrenia, depression, chronic pain syndrome due to degenerative disc disease in the lumbar spine on chronic opiates and pain medications, [place], [facility] for complaints of sudden onset of right eye vision loss as well as severe headaches. ?She describes that she has an headaches of migraines and pseudotumor cerebri and had for the past 20 years.  She gets spinal taps at least twice a year.  She is on Topamax currently for her headache prevention in addition to multiple antidepressant/antianxiety medications as well as opiates.  She reports her last known well being 2 days ago when she started a mild headache, she had a much more severe headache yesterday but this morning she woke up with a bad headache.  The headache worsened throughout the course of the day and around 5:45 PM she started noticing vision difficulty with the right eye which she describes as black vision.  She usually gets black floaters with her migrainous headaches but this was different and prompted her to come to the emergency room ?She reports pain in the base of her skull.  She also describes tingling, not numbness in her fingertips and toes. ?She was interviewed over the camera.  She was wearing sunglasses citing photophobia as the reason inside  the ER room. ? ?I tried to gather a lot of her headache history and she reports headache of 20 years with escalating doses of opiates and antidepressants being required for management.  She does not have a neurologist locally and was very concerned about not being able to get her opiates while in the hospital. ? ?When I described my plan of getting CT angio head and neck to rule out a dissection or vascular malformation as well as CTV to rule out a dural venous sinus thrombus followed by a spinal tap, she reported that she has been having spinal tap done under fluoroscopic guidance and would not be willing to do 1 at bedside since there is a lot of scar tissue from her multiple spinal taps and DJD. Her last LP was in [place] 9 mo ago.  ? ?She is allergic to Compazine.  She reports that her usual migraine cocktail IV consists of morphine, Phenergan and Benadryl. ? ?She is not taking Fioricet. She is currently on hydrocodone, Dilaudid, gabapentin, Ambien and Klonopin.  Also taking tizanidine. ? ? ?Past Medical History:  ?Diagnosis Date  ? Anxiety   ? Arthritis   ? Depression   ? Heart murmur   ? Pseudotumor cerebri   ? PTSD (post-traumatic stress disorder)   ? Schizo affective schizophrenia (HCC)   ? Sciatica   ? ? ?No current facility-administered medications for this encounter. ? ?Current Outpatient Medications:  ?  acetaZOLAMIDE (DIAMOX) 125 MG tablet, Take 1 tablet (125 mg total) by mouth 2 (two) times daily., Disp: 30 tablet, Rfl: 0 ?  alprazolam (XANAX) 2 MG tablet, Take 2  mg by mouth 3 (three) times daily as needed., Disp: , Rfl:  ?  benzonatate (TESSALON) 100 MG capsule, Take 1 capsule (100 mg total) by mouth every 8 (eight) hours., Disp: 21 capsule, Rfl: 0 ?  butalbital-acetaminophen-caffeine (FIORICET) 50-325-40 MG tablet, butalbital-acetaminophen-caffeine 50 mg-325 mg-40 mg tablet, Disp: , Rfl:  ?  clonazePAM (KLONOPIN) 0.5 MG tablet, Take 0.5 mg by mouth 3 (three) times daily as needed., Disp: , Rfl:  ?   diazepam (VALIUM) 10 MG tablet, diazepam 10 mg tablet, Disp: , Rfl:  ?  diazepam (VALIUM) 5 MG tablet, Take 5 mg by mouth 2 (two) times daily as needed., Disp: , Rfl:  ?  DULoxetine (CYMBALTA) 30 MG capsule, Take 30 mg by mouth 3 (three) times daily., Disp: , Rfl:  ?  gabapentin (NEURONTIN) 600 MG tablet, Take 600 mg by mouth 3 (three) times daily., Disp: , Rfl:  ?  HYDROcodone-acetaminophen (NORCO) 10-325 MG tablet, hydrocodone 10 mg-acetaminophen 325 mg tablet, Disp: , Rfl:  ?  HYDROmorphone (DILAUDID) 2 MG tablet, Take 2 mg by mouth 2 (two) times daily as needed., Disp: , Rfl:  ?  hydrOXYzine (ATARAX) 50 MG tablet, Take 100 mg by mouth at bedtime., Disp: , Rfl:  ?  Lidocaine (HM LIDOCAINE PATCH) 4 % PTCH, Apply 1 patch topically daily., Disp: 15 patch, Rfl: 0 ?  loperamide (IMODIUM) 2 MG capsule, Take 1 capsule (2 mg total) by mouth 4 (four) times daily as needed for diarrhea or loose stools., Disp: 12 capsule, Rfl: 0 ?  naloxone (NARCAN) nasal spray 4 mg/0.1 mL, SMARTSIG:Both Nares, Disp: , Rfl:  ?  QUEtiapine (SEROQUEL) 100 MG tablet, Take 100 mg by mouth 3 (three) times daily., Disp: , Rfl:  ?  tiZANidine (ZANAFLEX) 4 MG tablet, Take 1 tablet (4 mg total) by mouth every 12 (twelve) hours as needed for muscle spasms. (Patient taking differently: Take 4 mg by mouth 3 (three) times daily.), Disp: 10 tablet, Rfl: 0 ?  traZODone (DESYREL) 100 MG tablet, Take 200 mg by mouth at bedtime as needed., Disp: , Rfl:  ?  zolpidem (AMBIEN) 10 MG tablet, Take 10 mg by mouth at bedtime as needed., Disp: , Rfl:  ? ? ? ?LKW: Likely more than 24 hours ago ?tpa given?: No, complex migraine versus pseudotumor cerebri headache-less likely to be stroke ?IR Thrombectomy? No, no LVO ?Modified Rankin Scale: 1-No significant post stroke disability and can perform usual duties with stroke symptoms ?Time of teleneurologist evaluation: 1838 hrs. ?Time of page 1836 hrs. ? ?Exam: ?Vitals:  ? 06/22/21 1812  ?BP: (!) 114/51  ?Pulse: (!) 108   ?Resp: 18  ?Temp: 97.8 ?F (36.6 ?C)  ?SpO2: 100%  ? ? ?General: Obese woman lying comfortably in bed with sunglasses ?Neurological exam ?She is awake alert oriented x3 her speech is clear without any evidence of dysarthria or aphasia.  Her pupils are equal round react light, extraocular movements appear unhindered, she reports diminished visual acuity in the right eye and some blurred vision in the left eye.  Visual fields are full, facial sensation is diminished on the right side.  Facial symmetry is preserved.  No drift in any of the 4 extremities although both lower extremities are painful to left due to DJD in the lumbar spine and that is baseline according to her.  She also reports more shakiness while standing of those legs but on my camera examination the exam appears symmetric. ?Sensory exam was positive for diminished sensation on the right side  throughout her body.  No dysmetria.  Gait testing was deferred ? ? ?NIHSS ?1A: Level of Consciousness - 0 ?1B: Ask Month and Age - 0 ?1C: 'Blink Eyes' & 'Squeeze Hands' - 0 ?2: Test Horizontal Extraocular Movements - 0 ?3: Test Visual Fields - 0 ?4: Test Facial Palsy - 0 ?5A: Test Left Arm Motor Drift - 0 ?5B: Test Right Arm Motor Drift - 0 ?6A: Test Left Leg Motor Drift - 0 ?6B: Test Right Leg Motor Drift - 0 ?7: Test Limb Ataxia - 0 ?8: Test Sensation - 1 ?9: Test Language/Aphasia- 0 ?10: Test Dysarthria - 0 ?11: Test Extinction/Inattention - 0 ?NIHSS score: 1 ? ? ?Imaging Reviewed:  ?CT scan of the head: No acute changes.  Aspects 10.  Compared to CT head from April 01, 2021 with unchanged empty sella-could be seen in the setting of IIH T ? ?Labs reviewed in epic and pertinent values follow: ?CBC ?   ?Component Value Date/Time  ? WBC 7.3 05/23/2021 1453  ? RBC 4.64 05/23/2021 1453  ? HGB 14.5 05/23/2021 1453  ? HCT 45.0 05/23/2021 1453  ? PLT 283 05/23/2021 1453  ? MCV 97.0 05/23/2021 1453  ? MCH 31.3 05/23/2021 1453  ? MCHC 32.2 05/23/2021 1453  ? RDW 13.4  05/23/2021 1453  ? LYMPHSABS 2.1 05/23/2021 1453  ? MONOABS 0.4 05/23/2021 1453  ? EOSABS 0.3 05/23/2021 1453  ? BASOSABS 0.1 05/23/2021 1453  ? ?CMP  ?   ?Component Value Date/Time  ? NA 140 05/23/2021 1453  ? K

## 2021-06-22 NOTE — Assessment & Plan Note (Addendum)
Hx of pseudotumor cerebri ?Woke up with headache and vision changes. ?Neurology was consulted ?CT head and CTA head and neck and CT venogram negative for any acute abnormality. ?MRI brain with and without contrast negative for any acute abnormality. ?Neurology felt that her presentation is most likely consistent with increased pressure secondary to pseudotumor cerebri and recommended urgent LP. ?LP performed on 3/27 with opening pressure of 29 and closing pressure of 9. ?-continue home gabapentin, tizanidine, gabapentin and Dilaudid ?-Agree with neurology to avoid additional opioids  ?-Needs referral for outpatient headache clinic at [facility] for Dr Jaynee Eagles or Dr Billey Gosling. ?-Discussed with Dr. Quinn Axe neurology, patient does not require inpatient follow-up.  I will place outpatient referral. ?

## 2021-06-22 NOTE — ED Provider Triage Note (Signed)
Emergency Medicine Provider Triage Evaluation Note ? ?Marcelle Smiling Doria , a 46 y.o. female  was evaluated in triage.  Pt complains of headache for several days. 45 min PTA at 545 she reports vision loss to the right eye. She also reports shakiness to her legs. ? ?Review of Systems  ?Positive: Headache, vision lost, tremors ?Negative: fever ? ?Physical Exam  ?BP (!) 114/51 (BP Location: Left Arm)   Pulse (!) 108   Temp 97.8 ?F (36.6 ?C) (Oral)   Resp 18   SpO2 100%  ?Gen:   Awake, no distress   ?Resp:  Normal effort  ?MSK:   Moves extremities without difficulty  ?Other:  Pupils dilated but equal round and reactive to light, eoms intact, 5/5 strength to the bue/ble, decreased sensation to the right side of the face and right leg. +pronator drift to the RUE ? ?Medical Decision Making  ?Medically screening exam initiated at 6:25 PM.  Appropriate orders placed.  Shanena Pellegrino Marcoe was informed that the remainder of the evaluation will be completed by another provider, this initial triage assessment does not replace that evaluation, and the importance of remaining in the ED until their evaluation is complete. ? ?Discussed case with Dr. Audley Hose, supervising physician who is in agreement with activating a code stroke. ?  Karrie Meres, PA-C ?06/22/21 1825 ? ?

## 2021-06-23 ENCOUNTER — Observation Stay (HOSPITAL_COMMUNITY): Payer: BC Managed Care – PPO

## 2021-06-23 DIAGNOSIS — Z6841 Body Mass Index (BMI) 40.0 and over, adult: Secondary | ICD-10-CM | POA: Diagnosis present

## 2021-06-23 DIAGNOSIS — Z8679 Personal history of other diseases of the circulatory system: Secondary | ICD-10-CM

## 2021-06-23 DIAGNOSIS — H547 Unspecified visual loss: Secondary | ICD-10-CM | POA: Diagnosis not present

## 2021-06-23 LAB — RAPID URINE DRUG SCREEN, HOSP PERFORMED
Amphetamines: NOT DETECTED
Barbiturates: NOT DETECTED
Benzodiazepines: POSITIVE — AB
Cocaine: NOT DETECTED
Opiates: POSITIVE — AB
Tetrahydrocannabinol: NOT DETECTED

## 2021-06-23 LAB — URINALYSIS, ROUTINE W REFLEX MICROSCOPIC
Bilirubin Urine: NEGATIVE
Glucose, UA: NEGATIVE mg/dL
Hgb urine dipstick: NEGATIVE
Ketones, ur: NEGATIVE mg/dL
Leukocytes,Ua: NEGATIVE
Nitrite: NEGATIVE
Protein, ur: NEGATIVE mg/dL
Specific Gravity, Urine: 1.01 (ref 1.005–1.030)
pH: 6 (ref 5.0–8.0)

## 2021-06-23 LAB — BASIC METABOLIC PANEL
Anion gap: 6 (ref 5–15)
BUN: 10 mg/dL (ref 6–20)
CO2: 25 mmol/L (ref 22–32)
Calcium: 8.8 mg/dL — ABNORMAL LOW (ref 8.9–10.3)
Chloride: 108 mmol/L (ref 98–111)
Creatinine, Ser: 0.9 mg/dL (ref 0.44–1.00)
GFR, Estimated: >60 mL/min (ref >60)
Glucose, Bld: 158 mg/dL — ABNORMAL HIGH (ref 70–99)
Potassium: 4.7 mmol/L (ref 3.5–5.1)
Sodium: 139 mmol/L (ref 135–145)

## 2021-06-23 MED ORDER — TOPIRAMATE 100 MG PO TABS
100.0000 mg | ORAL_TABLET | Freq: Two times a day (BID) | ORAL | Status: DC
  Administered 2021-06-23 – 2021-06-24 (×2): 100 mg via ORAL
  Filled 2021-06-23 (×2): qty 1

## 2021-06-23 MED ORDER — ONDANSETRON HCL 4 MG/2ML IJ SOLN: 4.0000 mg | Freq: Four times a day (QID) | INTRAMUSCULAR | Status: DC | PRN

## 2021-06-23 MED ORDER — ONDANSETRON HCL 4 MG PO TABS: 4.0000 mg | ORAL_TABLET | Freq: Four times a day (QID) | ORAL | Status: DC | PRN

## 2021-06-23 MED ORDER — ACETAMINOPHEN 650 MG RE SUPP
650.0000 mg | Freq: Four times a day (QID) | RECTAL | Status: DC | PRN
Start: 2021-06-23 — End: 2021-06-24

## 2021-06-23 MED ORDER — POLYETHYLENE GLYCOL 3350 17 G PO PACK
17.0000 g | PACK | Freq: Every day | ORAL | Status: DC | PRN
  Administered 2021-06-23: 17 g via ORAL
  Filled 2021-06-23: qty 1

## 2021-06-23 MED ORDER — GADOBUTROL 1 MMOL/ML IV SOLN
10.0000 mL | Freq: Once | INTRAVENOUS | Status: AC | PRN
  Administered 2021-06-23: 10 mL via INTRAVENOUS

## 2021-06-23 MED ORDER — ACETAMINOPHEN 325 MG PO TABS
650.0000 mg | ORAL_TABLET | Freq: Four times a day (QID) | ORAL | Status: DC | PRN
  Administered 2021-06-24: 650 mg via ORAL
  Filled 2021-06-23: qty 2

## 2021-06-23 NOTE — Progress Notes (Signed)
Pt returned to floor after procedure and remained flat in bed for 4 hours.  Vitals stable and patient resting comfortably.  Pt able to stand and ambulate to bathroom at 1830.   ?Will continue to monitor.  ?

## 2021-06-23 NOTE — Assessment & Plan Note (Signed)
Body mass index is 52.84 kg/m?Marland Kitchen  ?Placing the pt at higher risk of poor outcomes. ?

## 2021-06-23 NOTE — Procedures (Signed)
CLINICAL DATA: ?Idiopathic intracranial hypertension, therapeutic lumbar puncture ?EXAM: ?THERAPUTIC LUMBAR PUNCTURE UNDER FLUOROSCOPIC GUIDANCE ?COMPARISON: ?MRI brain 06/23/2021; CT lumbar spine 05/26/2021 ?FLUOROSCOPY: ?Fluoroscopy Time:  0 minutes, 36 seconds ?Radiation Exposure Index (if provided by the fluoroscopic device):  4.3 mGy ?Number of Acquired Spot Images: 0 ?PROCEDURE: ?We discussed the risks (including hemorrhage, infection, and nerve damage, among others), benefits, and alternatives to fluoroscopically guided lumbar puncture with the patient along with the high likelihood of technical success of the procedure.  The patient understood and elected to undergo the procedure.  ? ?Standard time-out was employed.  Following sterile skin prep and local anesthetic administration consisting of 1 percent lidocaine, a 6 inch 20 gauge spinal needle was advanced without difficulty into the thecal sac at the L2-3 level under fluoroscopic guidance.  Clear CSF was returned.  We carefully and gently turned the patient into the left lateral decubitus position.  Opening pressure was 29 cm of water.  ? ?A total of 26 cc of clear CSF was collected in 4 vials and discarded.  Closing pressure was 9 cm of water.  The needle was subsequently removed and the skin cleansed and bandaged.  No immediate complications were observed.  ? ? ?IMPRESSION: ? ?Successful therapeutic lumbar puncture.  Opening pressure was 29 cm water; after removal of 26 cc of clear CSF the closing pressure was 9 cm of water.  ? ? ? ?

## 2021-06-23 NOTE — Progress Notes (Signed)
?  Progress Note ? ? ?Patient: Audrey [name] AQT:[id number] DOB: 08-16-1975 DOA: 06/22/2021     Hospitalization day: 0 ?DOS: the patient was seen and examined on 06/23/2021 ? ?Brief hospital course: ?Audrey [name] is a 46 y.o. female with medical history significant of pseudotumor cerebri, hypertension, schizoaffective schizophrenia, depression, chronic pain syndrome who presents with right vision loss due to pseudotumor cerebri syndrome. ? ?Assessment and Plan: ?* Vision loss ?Hx of pseudotumor cerebri ?Woke up with headache and vision changes. ?Neurology was consulted ?CT head and CTA head and neck and CT venogram negative for any acute abnormality. ?MRI brain with and without contrast negative for any acute abnormality. ?Neurology felt that her presentation is most likely consistent with increased pressure secondary to pseudotumor cerebri and recommended urgent LP. ?LP performed on 3/27 with opening pressure of 29 and closing pressure of 9. ?We will observe the patient for improvement in neurodeficit. ?-continue home gabapentin, tizanidine, gabapentin and Dilaudid ?-Agree with neurology to avoid additional opioids  ?-Needs referral for outpatient headache clinic at [facility] for Dr Lucia Gaskins or Dr Delena Bali. ? ?AKI (acute kidney injury) (HCC) ?-Creatinine elevated to 1.32 from prior of 0.97.  Has already received 1 L of normal saline bolus in the ED.  Follow repeat creatinine in the morning. ?-Has received a dose of Toradol in the ED. Will trial other pain control methods recommended by neurology before repeat NSAIDS due to AKI ? ?Chronic pain ?-Continue home Dilaudid 2mg  BID-pt reports to me that she takes it q6hrs but per [place] substance database this is only prescribed for twice daily ?Outpatient follow-up with PCP recommended. ? ?Obesity, morbid, BMI 50 or higher (HCC) ?Body mass index is 52.84 kg/m?  ?Placing the pt at higher risk of poor outcomes. ? ?Essential hypertension ?- Continue home verapamil, hold  losartan ? ? ?Schizoaffective disorder (HCC) ?History of self-harm/suicidal attempt in the past.  No current SI. ?- Continue home mirtazapine, Seroquel, clonazepam ? ? ?Subjective: Continues to have headache continues to have vision changes although feels that is improving.  No nausea no vomiting but no fever no chills.  No diarrhea.  No focal deficit otherwise. ? ?Physical Exam: ?Vitals:  ? 06/23/21 0504 06/23/21 1004 06/23/21 1207 06/23/21 1450  ?BP: 124/80 106/90 117/85 111/71  ?Pulse: 87 90 81 79  ?Resp: 18 18 18 18   ?Temp: 98.2 ?F (36.8 ?C) 97.9 ?F (36.6 ?C) 98 ?F (36.7 ?C) 97.7 ?F (36.5 ?C)  ?TempSrc: Oral Oral Oral Oral  ?SpO2: 96% 92% 96% 97%  ?Weight:      ?Height:      ? ?General: Appear in mild distress; no visible Abnormal Neck Mass Or lumps, Conjunctiva normal ?Cardiovascular: S1 and S2 Present, no Murmur, ?Respiratory: good respiratory effort, Bilateral Air entry present and CTA, no Crackles, no wheezes ?Abdomen: Bowel Sound present, Non tender ?Extremities: no Pedal edema ?Neurology: alert and oriented to time, place, and person ?Gait not checked due to patient safety concerns  ? ?Data Reviewed: ?I have Reviewed nursing notes, Vitals, and Lab results since pt's last encounter. Pertinent lab results CBC and BMP ?I have ordered test including BMP ?I have reviewed the last note from neurology and radiology,   ? ?Family Communication: None at bedside ? ?Disposition: ?Status is: Observation ? ?Author: ?06/25/21, MD ?06/23/2021 4:43 PM ? ?For on call review [email or web address]. ?

## 2021-06-24 DIAGNOSIS — H547 Unspecified visual loss: Secondary | ICD-10-CM | POA: Diagnosis not present

## 2021-06-24 NOTE — Progress Notes (Signed)
PT Note ? ?Patient Details ?Name: Audrey [name] ?MRN: [id number] ?DOB: 17-May-1975 ? ?PT order prior to pt DC today. Spoke with TOC and also spoke with pt at bedside. Pt has been dealing with some baseline weakness and OA as she reports with needing assistance from her husband. She has been ambulating to / from the bathroom while here and reports her vision is improving. Do agree pt would benefit from HHPT to continue strengthening, balance training and any safety education in the home and for future needs.  ? ?To sign off at this time for PT. Thank you  ?     ? ? Clide Dales ?06/24/2021, 5:13 PM ?Gatha Mayer, PT, MPT ?Acute Rehabilitation Services ?Office: [phone] ?Pager: [phone] ?06/24/2021 ? ?

## 2021-06-24 NOTE — Discharge Summary (Addendum)
?Physician Discharge Summary ?  ?Patient: Audrey [name] MRN: [id number] DOB: 1975/10/08  ?Admit date:     06/22/2021  ?Discharge date:   ?Discharge Physician: Berle Mull  ?PCP: Sue Lush, PA-C ? ?Recommendations at discharge: ?Follow-up with neurology to establish care. ?Follow-up with PCP. ?Patient needs outpatient sleep study. ? ?Discharge Diagnoses: ?Principal Problem: ?  Vision loss ?Active Problems: ?  AKI (acute kidney injury) ([place]) ?  Chronic pain ?  Pseudotumor cerebri ?  Schizoaffective disorder ([place]) ?  Essential hypertension ?  History of pericarditis ?  Obesity, morbid, BMI 50 or higher ([place]) ? ? ?Hospital Course: ?Presents with complaints of severe headache and loss of vision in the right eye.  Visual loss progressively worsen.  Extensive work-up in the ER was negative.  Neurology was consulted.  Recommended lumbar puncture which improved vision. ? ?Assessment and Plan: ?* Vision loss ?Hx of pseudotumor cerebri ?Woke up with headache and vision changes. ?Neurology was consulted ?CT head and CTA head and neck and CT venogram negative for any acute abnormality. ?MRI brain with and without contrast negative for any acute abnormality. ?Neurology felt that her presentation is most likely consistent with increased pressure secondary to pseudotumor cerebri and recommended urgent LP. ?LP performed on 3/27 with opening pressure of 29 and closing pressure of 9. ?-continue home gabapentin, tizanidine, gabapentin and Dilaudid ?-Agree with neurology to avoid additional opioids  ?-Needs referral for outpatient headache clinic at [facility] for Dr Jaynee Eagles or Dr Billey Gosling. ?-Discussed with Dr. Quinn Axe neurology, patient does not require inpatient follow-up.  I will place outpatient referral. ?Given her vision loss would recommend PT and OT as well as DME at home to ensure safety. ? ?AKI (acute kidney injury) ([place]) ?Creatinine elevated to 1.32 from prior of 0.97.   ?Improved with IV hydration.  Monitor. ? ?Chronic  pain ?-Continue home Dilaudid ? ?Obesity, morbid, BMI 50 or higher ([place]) ?Body mass index is 52.84 kg/m?Marland Kitchen  ?Placing the pt at higher risk of poor outcomes. ? ?Essential hypertension ?- Continue home verapamil, hold losartan ? ?Schizoaffective disorder ([place]) ?History of self-harm/suicidal attempt in the past.  No current SI. ?- Continue home mirtazapine, Seroquel, clonazepam ? ?Pseudotumor cerebri ?Outpatient follow-up with neurology recommended. ? ?Consultants: Neurology, IR for LP ?Procedures performed:  ?Lumbar puncture ?DISCHARGE MEDICATION: ?Allergies as of 06/24/2021   ? ?   Reactions  ? Penicillins Anaphylaxis, Rash  ? Prochlorperazine Anaphylaxis, Nausea And Vomiting  ? Acetazolamide Other (See Comments)  ? Joint pain  ? Latex Hives, Swelling  ? Propoxyphene Nausea And Vomiting  ? Nitroglycerin Er Swelling, Other (See Comments)  ? Body swells, but no breathing issues  ? ?  ? ?  ?Medication List  ?  ? ?TAKE these medications   ? ?albuterol 108 (90 Base) MCG/ACT inhaler ?Commonly known as: VENTOLIN HFA ?Inhale 2 puffs into the lungs every 6 (six) hours as needed for wheezing or shortness of breath. ?  ?clonazePAM 1 MG tablet ?Commonly known as: KLONOPIN ?Take 1 mg by mouth 3 (three) times daily. ?  ?DULoxetine 60 MG capsule ?Commonly known as: CYMBALTA ?Take 60 mg by mouth in the morning, at noon, and at bedtime. ?  ?gabapentin 800 MG tablet ?Commonly known as: NEURONTIN ?Take 800 mg by mouth 3 (three) times daily. ?  ?HYDROmorphone 2 MG tablet ?Commonly known as: DILAUDID ?Take 2 mg by mouth See admin instructions. Take 2 mg by mouth at 2 AM, 8 AM, 2 PM, and 8 PM ?  ?hydrOXYzine 100  MG capsule ?Commonly known as: VISTARIL ?Take 100 mg by mouth at bedtime. ?  ?lidocaine 5 % ?Commonly known as: LIDODERM ?Place 1 patch onto the skin See admin instructions. Apply 1 patch to the lower back once a day- Remove & Discard patch within 12 hours or as directed by MD ?  ?losartan 25 MG tablet ?Commonly known as:  COZAAR ?Take 25 mg by mouth daily. ?  ?mirtazapine 15 MG disintegrating tablet ?Commonly known as: REMERON SOL-TAB ?Take 30 mg by mouth at bedtime. ?  ?naloxone 4 MG/0.1ML Liqd nasal spray kit ?Commonly known as: NARCAN ?Place 1 spray into the nose once as needed (for accidental overdose). ?  ?ondansetron 4 MG tablet ?Commonly known as: ZOFRAN ?Take 4 mg by mouth every 8 (eight) hours as needed for nausea or vomiting. ?  ?QUEtiapine 100 MG tablet ?Commonly known as: SEROQUEL ?Take 100 mg by mouth 3 (three) times daily. ?  ?tiZANidine 4 MG tablet ?Commonly known as: Zanaflex ?Take 1 tablet (4 mg total) by mouth every 12 (twelve) hours as needed for muscle spasms. ?What changed: when to take this ?  ?topiramate 100 MG tablet ?Commonly known as: TOPAMAX ?Take 100 mg by mouth 2 (two) times daily. ?  ?Ubrelvy 100 MG Tabs ?Generic drug: Ubrogepant ?Take 100 mg by mouth. Take 100 mg total (1 tablet) by mouth as needed. At onset of pain And may repeat in 2 hours if needed ?  ?verapamil 180 MG 24 hr capsule ?Commonly known as: VERELAN PM ?Take 180 mg by mouth daily. ?  ? ?  ? ?  ?  ? ? ?  ?Durable Medical Equipment  ?(From admission, onward)  ?  ? ? ?  ? ?  Start     Ordered  ? 06/24/21 1622  For home use only DME Other see comment  Once       ?Comments: Shower chair  ?Question:  Length of Need  Answer:  Lifetime  ? 06/24/21 1622  ? 06/24/21 1608  For home use only DME lightweight manual wheelchair with seat cushion  Once       ?Comments: Patient suffers from Vision loss, pseudotumor cerebri, chronic pain, bilateral lower extremity weakness, 305 lbs which impairs their ability to perform daily activities like bathing, grooming, dressing, in the home.  A Cane and Gilford Rile will not resolve  ?issue with performing activities of daily living. A wheelchair will allow patient to safely perform daily activities. Patient is not able to propel themselves in the home using a standard weight wheelchair due to bilateral weakness, chronic  Pain, numb fingers Patient can self propel in the lightweight wheelchair. Length of need Lifetime. Need Heavy Duty Wheel Chair. ?Accessories: elevating leg rests (ELRs), wheel locks, extensions and anti-tippers.  ? 06/24/21 1621  ? ?  ?  ? ?  ? ? Follow-up Information   ? ? Sue Lush, PA-C. Schedule an appointment as soon as possible for a visit in 1 week(s).   ?Specialty: Physician Assistant ?Contact information: ?[address] ?[address] ?[place] [address] ?[phone] ? ? ?  ?  ? ? [facility]. Schedule an appointment as soon as possible for a visit in 1 month(s).   ?Specialty: Neurology ?Contact information: ?[address] [place] ?[phone] ? ?  ?  ? ?  ?  ? ?  ? ?Disposition: Home ?Diet recommendation:  ?Discharge Diet Orders (From admission, onward)  ? ?  Start  Ordered  ? 06/24/21 0000  Diet - low sodium heart healthy       ? 06/24/21 1513  ? ?  ?  ? ?  ? ?Discharge Exam: ?Danley Danker Weights  ? 06/22/21 2352  ?Weight: 135.3 kg  ? ?General: Appear in no distress; no visible Abnormal Neck Mass Or lumps, Conjunctiva normal ?Cardiovascular: S1 and S2 Present, no Murmur, ?Respiratory: good respiratory effort, Bilateral Air entry present and CTA, no Crackles, no wheezes ?Abdomen: Bowel Sound present Non tender ?Extremities: no Pedal edema ?Neurology: alert and oriented to time, place, and person ?Gait not checked due to patient safety concerns  ? ?Condition at discharge: good ? ?The results of significant diagnostics from this hospitalization (including imaging, microbiology, ancillary and laboratory) are listed below for reference.  ? ?Imaging Studies: ?MR BRAIN W WO CONTRAST ? ?Result Date: 06/23/2021 ?CLINICAL DATA:  Vision loss, monocular EXAM: MRI HEAD WITHOUT AND WITH CONTRAST TECHNIQUE: Multiplanar, multiecho pulse sequences of the brain and surrounding structures were obtained without and with intravenous contrast. CONTRAST:  25m  GADAVIST GADOBUTROL 1 MMOL/ML IV SOLN COMPARISON:  CTA/CTV and CT head on June 22, 2021. FINDINGS: Brain: No acute infarction, hemorrhage, hydrocephalus, extra-axial collection or mass lesion. No pathologic enhancement.

## 2021-06-24 NOTE — TOC Progression Note (Signed)
Transition of Care (TOC) - Progression Note  ? ? ?Patient Details  ?Name: Korbin Notaro Coxe ?MRN: [id number] ?Date of Birth: [birth year] ? ?Transition of Care (TOC) CM/SW Contact  ?Geni Bers, RN ?Phone Number: ?06/24/2021, 4:24 PM ? ?Clinical Narrative:    ?Spoke with pt concerning discharge needs. [facility] was selected for HHPT and [employer] for DME. Referrals given to in house reps.  ? ? ?  ?  ? ?Expected Discharge Plan and Services ?  ?  ?  ?  ?  ?Expected Discharge Date: 06/24/21               ?  ?  ?  ?  ?  ?  ?  ?  ?  ?  ? ? ?Social Determinants of Health (SDOH) Interventions ?  ? ?Readmission Risk Interventions ?   ? View : No data to display.  ?  ?  ?  ? ? ?

## 2021-06-24 NOTE — Progress Notes (Signed)
?  ?  Durable Medical Equipment  ?(From admission, onward)  ?  ? ? ?  ? ?  Start     Ordered  ? 06/24/21 1622  For home use only DME Other see comment  Once       ?Comments: Shower chair  ?Question:  Length of Need  Answer:  Lifetime  ? 06/24/21 1622  ? 06/24/21 1608  For home use only DME lightweight manual wheelchair with seat cushion  Once       ?Comments: Patient suffers from Vision loss, pseudotumor cerebri, chronic pain, bilateral lower extremity weakness, 305 lbs which impairs their ability to perform daily activities like bathing, grooming, dressing, in the home.  A Cane and Dan Humphreys will not resolve  ?issue with performing activities of daily living. A wheelchair will allow patient to safely perform daily activities. Patient is not able to propel themselves in the home using a standard weight wheelchair due to bilateral weakness, chronic Pain, numb fingers Patient can self propel in the lightweight wheelchair. Length of need Lifetime. Need Heavy Duty Wheel Chair. ?Accessories: elevating leg rests (ELRs), wheel locks, extensions and anti-tippers.  ? 06/24/21 1621  ? ?  ?  ? ?  ?  ?

## 2021-06-24 NOTE — Progress Notes (Addendum)
OT Cancellation Note ? ?Patient Details ?Name: Audrey [name] ?MRN: [id number] ?DOB: 11/30/1975 ? ? ?Cancelled Treatment:    Reason Eval/Treat Not Completed: Other (comment) ?Patient was in bed with lunch/dinner arrived with patient reporting plan to transition home with [facility] PT services and DME( w/c and shower chair). patient reported she has been taking herself to the bathroom earlier on this date. Patient was educated on tub transfer bench after patient reported having tub/shower at home. Patient's case worker was messaged about adjusting order. Patient reported vision is improving since previous day. Patient reported husband is able to help her with all needed ADLs at home. OT signing off at this time.  ? ?Litsy Epting OTR/L, MS ?Acute Rehabilitation Department ?Office# [phone] ?Pager# [phone] ? ?[name] ?06/24/2021, 5:10 PM ?

## 2021-06-24 NOTE — Assessment & Plan Note (Signed)
Outpatient follow-up with neurology recommended. ?

## 2021-07-03 NOTE — Discharge Summary (Signed)
?Physician Discharge Summary ?  ?Patient: Audrey [name] MRN: [id number] DOB: [birth year]  ?Admit date:     06/22/2021  ?Discharge date: 06/24/2021  ?Discharge Physician: Berle Mull  ?PCP: Sue Lush, PA-C ? ?Recommendations at discharge: ?Follow-up with PCP in 1 week ?Establish care with neurologist recommended. ? ?Discharge Diagnoses: ?Principal Problem: ?  Vision loss ?Active Problems: ?  AKI (acute kidney injury) ([place]) ?  Chronic pain ?  Pseudotumor cerebri ?  Schizoaffective disorder ([place]) ?  Essential hypertension ?  History of pericarditis ?  Obesity, morbid, BMI 50 or higher ([place]) ? ? ?Hospital Course: ? ?Assessment and Plan: ?* Vision loss ?Hx of pseudotumor cerebri ?Woke up with headache and vision changes. ?Neurology was consulted ?CT head and CTA head and neck and CT venogram negative for any acute abnormality. ?MRI brain with and without contrast negative for any acute abnormality. ?Neurology felt that her presentation is most likely consistent with increased pressure secondary to pseudotumor cerebri and recommended urgent LP. ?LP performed on 3/27 with opening pressure of 29 and closing pressure of 9. ?-continue home gabapentin, tizanidine, gabapentin and Dilaudid ?-Agree with neurology to avoid additional opioids  ?-Needs referral for outpatient headache clinic at [facility] for Dr Jaynee Eagles or Dr Billey Gosling. ?-Discussed with Dr. Quinn Axe neurology, patient does not require inpatient follow-up.  I will place outpatient referral. ? ?AKI (acute kidney injury) ([place]) ?Creatinine elevated to 1.32 from prior of 0.97.   ?Improved with IV hydration.  Monitor. ? ?Chronic pain ?-Continue home Dilaudid ? ?Obesity, morbid, BMI 50 or higher ([place]) ?Body mass index is 52.84 kg/m?Marland Kitchen  ?Placing the pt at higher risk of poor outcomes. ? ?Essential hypertension ?- Continue home verapamil, hold losartan ? ? ?Schizoaffective disorder ([place]) ?History of self-harm/suicidal attempt in the past.  No current SI. ?- Continue home  mirtazapine, Seroquel, clonazepam ? ?Pseudotumor cerebri ?Outpatient follow-up with neurology recommended. ? ? ?Pain control - [place] Controlled Substance Reporting System database was reviewed. and patient was instructed, not to drive, operate heavy machinery, perform activities at heights, swimming or participation in water activities or provide baby-sitting services while on Pain, Sleep and Anxiety Medications; until their outpatient Physician has advised to do so again. Also recommended to not to take more than prescribed Pain, Sleep and Anxiety Medications.  ? ?Consultants: Neurology, IR ?Procedures performed:  ?Lumbar puncture ?DISCHARGE MEDICATION: ?Allergies as of 06/24/2021   ? ?   Reactions  ? Penicillins Anaphylaxis, Rash  ? Prochlorperazine Anaphylaxis, Nausea And Vomiting  ? Acetazolamide Other (See Comments)  ? Joint pain  ? Latex Hives, Swelling  ? Propoxyphene Nausea And Vomiting  ? Nitroglycerin Er Swelling, Other (See Comments)  ? Body swells, but no breathing issues  ? ?  ? ?  ?Medication List  ?  ? ?TAKE these medications   ? ?albuterol 108 (90 Base) MCG/ACT inhaler ?Commonly known as: VENTOLIN HFA ?Inhale 2 puffs into the lungs every 6 (six) hours as needed for wheezing or shortness of breath. ?  ?clonazePAM 1 MG tablet ?Commonly known as: KLONOPIN ?Take 1 mg by mouth 3 (three) times daily. ?  ?DULoxetine 60 MG capsule ?Commonly known as: CYMBALTA ?Take 60 mg by mouth in the morning, at noon, and at bedtime. ?  ?gabapentin 800 MG tablet ?Commonly known as: NEURONTIN ?Take 800 mg by mouth 3 (three) times daily. ?  ?HYDROmorphone 2 MG tablet ?Commonly known as: DILAUDID ?Take 2 mg by mouth See admin instructions. Take 2 mg by mouth at 2 AM, 8  AM, 2 PM, and 8 PM ?  ?hydrOXYzine 100 MG capsule ?Commonly known as: VISTARIL ?Take 100 mg by mouth at bedtime. ?  ?lidocaine 5 % ?Commonly known as: LIDODERM ?Place 1 patch onto the skin See admin instructions. Apply 1 patch to the lower back once a  day- Remove & Discard patch within 12 hours or as directed by MD ?  ?losartan 25 MG tablet ?Commonly known as: COZAAR ?Take 25 mg by mouth daily. ?  ?mirtazapine 15 MG disintegrating tablet ?Commonly known as: REMERON SOL-TAB ?Take 30 mg by mouth at bedtime. ?  ?naloxone 4 MG/0.1ML Liqd nasal spray kit ?Commonly known as: NARCAN ?Place 1 spray into the nose once as needed (for accidental overdose). ?  ?ondansetron 4 MG tablet ?Commonly known as: ZOFRAN ?Take 4 mg by mouth every 8 (eight) hours as needed for nausea or vomiting. ?  ?QUEtiapine 100 MG tablet ?Commonly known as: SEROQUEL ?Take 100 mg by mouth 3 (three) times daily. ?  ?tiZANidine 4 MG tablet ?Commonly known as: Zanaflex ?Take 1 tablet (4 mg total) by mouth every 12 (twelve) hours as needed for muscle spasms. ?What changed: when to take this ?  ?topiramate 100 MG tablet ?Commonly known as: TOPAMAX ?Take 100 mg by mouth 2 (two) times daily. ?  ?Ubrelvy 100 MG Tabs ?Generic drug: Ubrogepant ?Take 100 mg by mouth. Take 100 mg total (1 tablet) by mouth as needed. At onset of pain And may repeat in 2 hours if needed ?  ?verapamil 180 MG 24 hr capsule ?Commonly known as: VERELAN PM ?Take 180 mg by mouth daily. ?  ? ?  ? ? Follow-up Information   ? ? Sue Lush, PA-C. Schedule an appointment as soon as possible for a visit in 1 week(s).   ?Specialty: Physician Assistant ?Contact information: ?[address] ?[address] ?[place] [address] ?[phone] ? ? ?  ?  ? ? [facility]. Schedule an appointment as soon as possible for a visit in 1 month(s).   ?Specialty: Neurology ?Contact information: ?[place] ?[phone] ? ?  ?  ? ? [facility] Follow up.   ?Specialty: Home Health Services ?Why: for Home Health Physical Therapy. ?Contact information: ?[address] ?[address] ?[place] [address] ?[phone] ? ? ?  ?  ? ? [employer] Follow up.   ?Why: Wheel  Chair is from this company. If you have any problems please call [employer]. which is the above number. ?Contact information: ?[address] ?[place] [address] ?[phone] ? ? ?  ?  ? ?  ?  ? ?  ? ?Disposition: Home ?Diet recommendation:  ?Discharge Diet Orders (From admission, onward)  ? ?  Start     Ordered  ? 06/24/21 0000  Diet - low sodium heart healthy       ? 06/24/21 1513  ? ?  ?  ? ?  ? ?Discharge Exam: ?Danley Danker Weights  ? 06/22/21 2352  ?Weight: 135.3 kg  ? ?General: Appear in no distress; no visible Abnormal Neck Mass Or lumps, Conjunctiva normal ?Cardiovascular: S1 and S2 Present, no Murmur, ?Respiratory: good respiratory effort, Bilateral Air entry present and CTA, no Crackles, no wheezes ?Abdomen: Bowel Sound present Non tender  ?Extremities: no Pedal edema ?Neurology: alert and oriented to time, place, and person ?Gait not checked due to patient safety concerns  ? ?Condition at discharge: good ? ?The results of significant diagnostics from  this hospitalization (including imaging, microbiology, ancillary and laboratory) are listed below for reference.  ? ?Imaging Studies: ?MR BRAIN W WO CONTRAST ? ?Result Date: 06/23/2021 ?CLINICAL DATA:  Vision loss, monocular EXAM: MRI HEAD WITHOUT AND WITH CONTRAST TECHNIQUE: Multiplanar, multiecho pulse sequences of the brain and surrounding structures were obtained without and with intravenous contrast. CONTRAST:  34m GADAVIST GADOBUTROL 1 MMOL/ML IV SOLN COMPARISON:  CTA/CTV and CT head on June 22, 2021. FINDINGS: Brain: No acute infarction, hemorrhage, hydrocephalus, extra-axial collection or mass lesion. No pathologic enhancement. Partially empty sella. Vascular: Major arterial flow voids are maintained skull base Skull and upper cervical spine: Normal marrow signal. Sinuses/Orbits: Negative. Other: Small left mastoid effusion. IMPRESSION: 1. No evidence of acute intracranial abnormality. 2. Partially empty sella, which is often a normal anatomic variant but  can be associated with idiopathic intracranial hypertension (pseudotumor cerebri). Electronically Signed   By: FMargaretha SheffieldM.D.   On: 06/23/2021 11:25  ? ?CT VENOGRAM HEAD ? ?Result Date: 06/22/2021 ?CLINIC

## 2021-08-02 ENCOUNTER — Inpatient Hospital Stay (HOSPITAL_COMMUNITY): Payer: BC Managed Care – PPO

## 2021-08-02 ENCOUNTER — Inpatient Hospital Stay (HOSPITAL_COMMUNITY)
Admission: EM | Admit: 2021-08-02 | Discharge: 2021-08-13 | DRG: 917 | Disposition: A | Discharge status: Other Acute Inpatient Hospital | Payer: BC Managed Care – PPO | Attending: Internal Medicine | Admitting: Internal Medicine

## 2021-08-02 ENCOUNTER — Emergency Department (HOSPITAL_COMMUNITY): Payer: BC Managed Care – PPO

## 2021-08-02 DIAGNOSIS — I110 Hypertensive heart disease with heart failure: Secondary | ICD-10-CM | POA: Diagnosis present

## 2021-08-02 DIAGNOSIS — R579 Shock, unspecified: Secondary | ICD-10-CM | POA: Diagnosis present

## 2021-08-02 DIAGNOSIS — R946 Abnormal results of thyroid function studies: Secondary | ICD-10-CM | POA: Diagnosis present

## 2021-08-02 DIAGNOSIS — Z888 Allergy status to other drugs, medicaments and biological substances status: Secondary | ICD-10-CM

## 2021-08-02 DIAGNOSIS — Z6841 Body Mass Index (BMI) 40.0 and over, adult: Secondary | ICD-10-CM | POA: Diagnosis not present

## 2021-08-02 DIAGNOSIS — D539 Nutritional anemia, unspecified: Secondary | ICD-10-CM | POA: Diagnosis present

## 2021-08-02 DIAGNOSIS — Z91410 Personal history of adult physical and sexual abuse: Secondary | ICD-10-CM

## 2021-08-02 DIAGNOSIS — Z9151 Personal history of suicidal behavior: Secondary | ICD-10-CM

## 2021-08-02 DIAGNOSIS — F332 Major depressive disorder, recurrent severe without psychotic features: Secondary | ICD-10-CM | POA: Diagnosis not present

## 2021-08-02 DIAGNOSIS — I469 Cardiac arrest, cause unspecified: Secondary | ICD-10-CM | POA: Diagnosis present

## 2021-08-02 DIAGNOSIS — Z8659 Personal history of other mental and behavioral disorders: Secondary | ICD-10-CM | POA: Diagnosis not present

## 2021-08-02 DIAGNOSIS — G931 Anoxic brain damage, not elsewhere classified: Secondary | ICD-10-CM | POA: Diagnosis present

## 2021-08-02 DIAGNOSIS — S0181XA Laceration without foreign body of other part of head, initial encounter: Secondary | ICD-10-CM | POA: Diagnosis present

## 2021-08-02 DIAGNOSIS — J9601 Acute respiratory failure with hypoxia: Secondary | ICD-10-CM | POA: Diagnosis present

## 2021-08-02 DIAGNOSIS — Z9049 Acquired absence of other specified parts of digestive tract: Secondary | ICD-10-CM

## 2021-08-02 DIAGNOSIS — J9602 Acute respiratory failure with hypercapnia: Secondary | ICD-10-CM | POA: Diagnosis present

## 2021-08-02 DIAGNOSIS — R000 Tachycardia, unspecified: Secondary | ICD-10-CM | POA: Diagnosis not present

## 2021-08-02 DIAGNOSIS — G894 Chronic pain syndrome: Secondary | ICD-10-CM

## 2021-08-02 DIAGNOSIS — Z20822 Contact with and (suspected) exposure to covid-19: Secondary | ICD-10-CM | POA: Diagnosis present

## 2021-08-02 DIAGNOSIS — E538 Deficiency of other specified B group vitamins: Secondary | ICD-10-CM | POA: Diagnosis present

## 2021-08-02 DIAGNOSIS — Z79899 Other long term (current) drug therapy: Secondary | ICD-10-CM

## 2021-08-02 DIAGNOSIS — R778 Other specified abnormalities of plasma proteins: Secondary | ICD-10-CM | POA: Diagnosis present

## 2021-08-02 DIAGNOSIS — T402X4A Poisoning by other opioids, undetermined, initial encounter: Principal | ICD-10-CM | POA: Diagnosis present

## 2021-08-02 DIAGNOSIS — I5181 Takotsubo syndrome: Secondary | ICD-10-CM | POA: Diagnosis present

## 2021-08-02 DIAGNOSIS — Z930 Tracheostomy status: Secondary | ICD-10-CM | POA: Diagnosis not present

## 2021-08-02 DIAGNOSIS — I429 Cardiomyopathy, unspecified: Secondary | ICD-10-CM | POA: Diagnosis not present

## 2021-08-02 DIAGNOSIS — I5082 Biventricular heart failure: Secondary | ICD-10-CM | POA: Diagnosis not present

## 2021-08-02 DIAGNOSIS — F29 Unspecified psychosis not due to a substance or known physiological condition: Secondary | ICD-10-CM | POA: Diagnosis present

## 2021-08-02 DIAGNOSIS — N170 Acute kidney failure with tubular necrosis: Secondary | ICD-10-CM | POA: Diagnosis present

## 2021-08-02 DIAGNOSIS — J690 Pneumonitis due to inhalation of food and vomit: Secondary | ICD-10-CM | POA: Diagnosis present

## 2021-08-02 DIAGNOSIS — G929 Unspecified toxic encephalopathy: Secondary | ICD-10-CM | POA: Diagnosis present

## 2021-08-02 DIAGNOSIS — W1830XA Fall on same level, unspecified, initial encounter: Secondary | ICD-10-CM | POA: Diagnosis present

## 2021-08-02 DIAGNOSIS — Z79891 Long term (current) use of opiate analgesic: Secondary | ICD-10-CM

## 2021-08-02 DIAGNOSIS — F251 Schizoaffective disorder, depressive type: Secondary | ICD-10-CM | POA: Diagnosis present

## 2021-08-02 DIAGNOSIS — N179 Acute kidney failure, unspecified: Secondary | ICD-10-CM

## 2021-08-02 DIAGNOSIS — Z9152 Personal history of nonsuicidal self-harm: Secondary | ICD-10-CM

## 2021-08-02 DIAGNOSIS — Z7189 Other specified counseling: Secondary | ICD-10-CM

## 2021-08-02 DIAGNOSIS — I5041 Acute combined systolic (congestive) and diastolic (congestive) heart failure: Secondary | ICD-10-CM | POA: Diagnosis not present

## 2021-08-02 DIAGNOSIS — F4310 Post-traumatic stress disorder, unspecified: Secondary | ICD-10-CM | POA: Diagnosis present

## 2021-08-02 DIAGNOSIS — I5021 Acute systolic (congestive) heart failure: Secondary | ICD-10-CM | POA: Diagnosis not present

## 2021-08-02 DIAGNOSIS — Z91040 Latex allergy status: Secondary | ICD-10-CM

## 2021-08-02 DIAGNOSIS — E8729 Other acidosis: Secondary | ICD-10-CM | POA: Diagnosis present

## 2021-08-02 DIAGNOSIS — I5189 Other ill-defined heart diseases: Secondary | ICD-10-CM | POA: Diagnosis not present

## 2021-08-02 DIAGNOSIS — Z8679 Personal history of other diseases of the circulatory system: Secondary | ICD-10-CM

## 2021-08-02 DIAGNOSIS — F419 Anxiety disorder, unspecified: Secondary | ICD-10-CM | POA: Diagnosis not present

## 2021-08-02 DIAGNOSIS — Z880 Allergy status to penicillin: Secondary | ICD-10-CM

## 2021-08-02 DIAGNOSIS — Z90710 Acquired absence of both cervix and uterus: Secondary | ICD-10-CM

## 2021-08-02 DIAGNOSIS — G932 Benign intracranial hypertension: Secondary | ICD-10-CM

## 2021-08-02 DIAGNOSIS — I10 Essential (primary) hypertension: Secondary | ICD-10-CM

## 2021-08-02 LAB — CBC
HCT: 39.6 % (ref 36.0–46.0)
Hemoglobin: 11.7 g/dL — ABNORMAL LOW (ref 12.0–15.0)
MCH: 31.3 pg (ref 26.0–34.0)
MCHC: 29.5 g/dL — ABNORMAL LOW (ref 30.0–36.0)
MCV: 105.9 fL — ABNORMAL HIGH (ref 80.0–100.0)
Platelets: 159 10*3/uL (ref 150–400)
RBC: 3.74 MIL/uL — ABNORMAL LOW (ref 3.87–5.11)
RDW: 14.6 % (ref 11.5–15.5)
WBC: 13.7 10*3/uL — ABNORMAL HIGH (ref 4.0–10.5)
nRBC: 0.1 % (ref 0.0–0.2)

## 2021-08-02 LAB — URINALYSIS, ROUTINE W REFLEX MICROSCOPIC
Bilirubin Urine: NEGATIVE
Glucose, UA: NEGATIVE mg/dL
Hgb urine dipstick: NEGATIVE
Ketones, ur: NEGATIVE mg/dL
Leukocytes,Ua: NEGATIVE
Nitrite: NEGATIVE
Protein, ur: 30 mg/dL — AB
Specific Gravity, Urine: 1.02 (ref 1.005–1.030)
pH: 5 (ref 5.0–8.0)

## 2021-08-02 LAB — GLUCOSE, CAPILLARY
Glucose-Capillary: 152 mg/dL — ABNORMAL HIGH (ref 70–99)
Glucose-Capillary: 153 mg/dL — ABNORMAL HIGH (ref 70–99)

## 2021-08-02 LAB — COMPREHENSIVE METABOLIC PANEL
ALT: 178 U/L — ABNORMAL HIGH (ref 0–44)
AST: 344 U/L — ABNORMAL HIGH (ref 15–41)
Albumin: 2.8 g/dL — ABNORMAL LOW (ref 3.5–5.0)
Alkaline Phosphatase: 109 U/L (ref 38–126)
Anion gap: 17 — ABNORMAL HIGH (ref 5–15)
BUN: 15 mg/dL (ref 6–20)
CO2: 16 mmol/L — ABNORMAL LOW (ref 22–32)
Calcium: 7.5 mg/dL — ABNORMAL LOW (ref 8.9–10.3)
Chloride: 111 mmol/L (ref 98–111)
Creatinine, Ser: 2.04 mg/dL — ABNORMAL HIGH (ref 0.44–1.00)
GFR, Estimated: 30 mL/min — ABNORMAL LOW (ref >60)
Glucose, Bld: 154 mg/dL — ABNORMAL HIGH (ref 70–99)
Potassium: 4 mmol/L (ref 3.5–5.1)
Sodium: 144 mmol/L (ref 135–145)
Total Bilirubin: 0.7 mg/dL (ref 0.3–1.2)
Total Protein: 5.2 g/dL — ABNORMAL LOW (ref 6.5–8.1)

## 2021-08-02 LAB — RAPID URINE DRUG SCREEN, HOSP PERFORMED
Amphetamines: NOT DETECTED
Barbiturates: NOT DETECTED
Benzodiazepines: POSITIVE — AB
Cocaine: NOT DETECTED
Opiates: POSITIVE — AB
Tetrahydrocannabinol: NOT DETECTED

## 2021-08-02 LAB — MAGNESIUM: Magnesium: 2.2 mg/dL (ref 1.7–2.4)

## 2021-08-02 LAB — HIV ANTIBODY (ROUTINE TESTING W REFLEX): HIV Screen 4th Generation wRfx: NONREACTIVE

## 2021-08-02 LAB — CK: Total CK: 109 U/L (ref 38–234)

## 2021-08-02 LAB — LACTIC ACID, PLASMA: Lactic Acid, Venous: 5.7 mmol/L (ref 0.5–1.9)

## 2021-08-02 LAB — TROPONIN I (HIGH SENSITIVITY)
Troponin I (High Sensitivity): 62 ng/L — ABNORMAL HIGH (ref <18)
Troponin I (High Sensitivity): 92 ng/L — ABNORMAL HIGH (ref <18)

## 2021-08-02 MED ORDER — PROPOFOL 1000 MG/100ML IV EMUL: 0.0000 ug/kg/min | INTRAVENOUS | Status: DC

## 2021-08-02 MED ORDER — ACETAMINOPHEN 160 MG/5ML PO SOLN
650.0000 mg | ORAL | Status: AC
  Administered 2021-08-03 – 2021-08-04 (×7): 650 mg
  Filled 2021-08-02 (×8): qty 20.3

## 2021-08-02 MED ORDER — NALOXONE HCL 2 MG/2ML IJ SOSY
PREFILLED_SYRINGE | INTRAMUSCULAR | Status: AC | PRN
  Administered 2021-08-02: 2 mg via INTRAVENOUS

## 2021-08-02 MED ORDER — FENTANYL 2500MCG IN NS 250ML (10MCG/ML) PREMIX INFUSION
50.0000 ug/h | INTRAVENOUS | Status: DC
  Administered 2021-08-02: 50 ug/h via INTRAVENOUS
  Administered 2021-08-03: 100 ug/h via INTRAVENOUS
  Administered 2021-08-04: 150 ug/h via INTRAVENOUS
  Filled 2021-08-02 (×3): qty 250

## 2021-08-02 MED ORDER — ACETAMINOPHEN 650 MG RE SUPP
650.0000 mg | RECTAL | Status: AC
  Administered 2021-08-03: 650 mg via RECTAL
  Filled 2021-08-02: qty 1

## 2021-08-02 MED ORDER — BUSPIRONE HCL 10 MG PO TABS: 30.0000 mg | ORAL_TABLET | Freq: Three times a day (TID) | ORAL | Status: AC | PRN

## 2021-08-02 MED ORDER — PANTOPRAZOLE 2 MG/ML SUSPENSION
40.0000 mg | Freq: Every day | ORAL | Status: DC
  Administered 2021-08-03 – 2021-08-06 (×4): 40 mg
  Filled 2021-08-02 (×6): qty 20

## 2021-08-02 MED ORDER — ACETAMINOPHEN 325 MG PO TABS
650.0000 mg | ORAL_TABLET | ORAL | Status: DC | PRN
  Administered 2021-08-06: 325 mg via ORAL
  Filled 2021-08-02: qty 2

## 2021-08-02 MED ORDER — FENTANYL BOLUS VIA INFUSION
50.0000 ug | INTRAVENOUS | Status: DC | PRN
  Administered 2021-08-02 – 2021-08-03 (×4): 50 ug via INTRAVENOUS
  Filled 2021-08-02: qty 100

## 2021-08-02 MED ORDER — FENTANYL CITRATE PF 50 MCG/ML IJ SOSY
50.0000 ug | PREFILLED_SYRINGE | Freq: Once | INTRAMUSCULAR | Status: AC
  Administered 2021-08-02: 50 ug via INTRAVENOUS

## 2021-08-02 MED ORDER — SODIUM CHLORIDE 0.9 % IV SOLN
250.0000 mL | INTRAVENOUS | Status: DC
  Administered 2021-08-03: 250 mL via INTRAVENOUS

## 2021-08-02 MED ORDER — ACETAMINOPHEN 325 MG PO TABS: 650.0000 mg | ORAL_TABLET | ORAL | Status: AC

## 2021-08-02 MED ORDER — SODIUM CHLORIDE 0.9 % IV SOLN
INTRAVENOUS | Status: DC
Start: 2021-08-02 — End: 2021-08-08

## 2021-08-02 MED ORDER — PROPOFOL 1000 MG/100ML IV EMUL
0.0000 ug/kg/min | INTRAVENOUS | Status: DC
  Administered 2021-08-02: 6 ug/kg/min via INTRAVENOUS
  Administered 2021-08-02: 40 ug/kg/min via INTRAVENOUS
  Administered 2021-08-03 (×3): 50 ug/kg/min via INTRAVENOUS
  Administered 2021-08-03: 45 ug/kg/min via INTRAVENOUS
  Administered 2021-08-03 (×2): 30 ug/kg/min via INTRAVENOUS
  Administered 2021-08-04 (×3): 50 ug/kg/min via INTRAVENOUS
  Filled 2021-08-02 (×10): qty 100

## 2021-08-02 MED ORDER — NOREPINEPHRINE 4 MG/250ML-% IV SOLN
0.0000 ug/min | INTRAVENOUS | Status: DC
  Administered 2021-08-02 (×2): 22 ug/min via INTRAVENOUS
  Administered 2021-08-03: 12 ug/min via INTRAVENOUS
  Administered 2021-08-03 (×2): 2 ug/min via INTRAVENOUS
  Administered 2021-08-03: 5 ug/min via INTRAVENOUS
  Administered 2021-08-04: 3 ug/min via INTRAVENOUS
  Administered 2021-08-05: 2 ug/min via INTRAVENOUS
  Filled 2021-08-02 (×5): qty 250

## 2021-08-02 MED ORDER — POLYETHYLENE GLYCOL 3350 17 G PO PACK
17.0000 g | PACK | Freq: Every day | ORAL | Status: DC
  Administered 2021-08-03 – 2021-08-04 (×2): 17 g
  Filled 2021-08-02 (×2): qty 1

## 2021-08-02 MED ORDER — ACETAMINOPHEN 160 MG/5ML PO SOLN
650.0000 mg | ORAL | Status: DC | PRN
  Administered 2021-08-05: 650 mg
  Filled 2021-08-02: qty 20.3

## 2021-08-02 MED ORDER — ACETAMINOPHEN 650 MG RE SUPP
650.0000 mg | RECTAL | Status: DC | PRN
  Filled 2021-08-02: qty 1

## 2021-08-02 MED ORDER — SUCCINYLCHOLINE CHLORIDE 20 MG/ML IJ SOLN
INTRAMUSCULAR | Status: DC | PRN
  Administered 2021-08-02: 150 mg via INTRAVENOUS

## 2021-08-02 MED ORDER — HEPARIN SODIUM (PORCINE) 5000 UNIT/ML IJ SOLN
5000.0000 [IU] | Freq: Three times a day (TID) | INTRAMUSCULAR | Status: DC
  Administered 2021-08-02 – 2021-08-13 (×29): 5000 [IU] via SUBCUTANEOUS
  Filled 2021-08-02 (×30): qty 1

## 2021-08-02 MED ORDER — SODIUM CHLORIDE 0.9 % IV SOLN
2.0000 g | INTRAVENOUS | Status: DC
  Administered 2021-08-02 – 2021-08-06 (×5): 2 g via INTRAVENOUS
  Filled 2021-08-02 (×5): qty 20

## 2021-08-02 MED ORDER — DOCUSATE SODIUM 50 MG/5ML PO LIQD
100.0000 mg | Freq: Two times a day (BID) | ORAL | Status: DC
  Administered 2021-08-03 – 2021-08-04 (×3): 100 mg
  Filled 2021-08-02 (×4): qty 10

## 2021-08-02 MED ORDER — DOCUSATE SODIUM 50 MG/5ML PO LIQD
100.0000 mg | Freq: Two times a day (BID) | ORAL | Status: DC | PRN
  Filled 2021-08-02: qty 10

## 2021-08-02 MED ORDER — SODIUM CHLORIDE 0.9 % IV SOLN: INTRAVENOUS | Status: DC | PRN

## 2021-08-02 MED ORDER — ONDANSETRON HCL 4 MG/2ML IJ SOLN
4.0000 mg | Freq: Four times a day (QID) | INTRAMUSCULAR | Status: DC | PRN
  Administered 2021-08-06 – 2021-08-07 (×2): 4 mg via INTRAVENOUS
  Filled 2021-08-02 (×2): qty 2

## 2021-08-02 MED ORDER — MAGNESIUM SULFATE 2 GM/50ML IV SOLN: 2.0000 g | Freq: Once | INTRAVENOUS | Status: AC | PRN

## 2021-08-02 MED ORDER — POLYETHYLENE GLYCOL 3350 17 G PO PACK: 17.0000 g | PACK | Freq: Every day | ORAL | Status: DC | PRN

## 2021-08-02 MED ORDER — NALOXONE HCL 2 MG/2ML IJ SOSY
PREFILLED_SYRINGE | INTRAMUSCULAR | Status: DC | PRN
  Administered 2021-08-02: 2 mg via INTRAVENOUS

## 2021-08-02 NOTE — IPAL (Addendum)
?  Interdisciplinary Goals of Care Family Meeting ? ? ?Date carried out:: 08/02/2021 ? ?Location of the meeting: Phone conference ? ?Member's involved: Family Member or next of kin and Other: PA ? ?Durable Power of Attorney or acting medical decision maker: Annavictoria Capurro ? ?Discussion: We discussed goals of care for Audrey [name] .  Spoke with spouse Meryssa Brander over phone. He is unable to come in tonight to see patient, but will try to come see her in the morning. Updated that patient had about 30 minutes total CPR to obtain ROSC. 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 Updated on current imaging and labs. He would like full code and do everything for now. ? ?Code status: Full Code ? ?Disposition: Continue current acute care ? ? ?Time spent for the meeting: 15 minutes ? ?Mick Sell ?08/02/2021, 9:47 PM ? ?

## 2021-08-02 NOTE — Progress Notes (Signed)
Pt dyssynchronous with vent, PIP reaching 49 with alarm set at 50. Vent alarming regularly, 2H RN made aware of possible need for more sedation and RN will let PCCM know. ?

## 2021-08-02 NOTE — ED Triage Notes (Signed)
Pt BIB by [facility] from home.  Fire department got to pt's home and patient was exhibiting agonal breaths.  Witnessed cardiac arrest by [facility] and CPR started at 1730.  Pt received 1mg  of Narcan and 7 Epi in the field by [facility].  1200 NS. PEA. Pt had fall last night and got a LAC on right side of face.   90 tablets of hydromorphine found at home with 27 pills missing in 3 days.  CBG 103.  Husband stated he left for work at [place] and pt was breathing and asleep.  Pt has hx of bipolar disorder and suicidal ideation.  Pt also has hx of abuse of medication. ?

## 2021-08-02 NOTE — ED Provider Notes (Signed)
?[facility] ?Provider Note ? ? ?CSN: [id number] ?Arrival date & time: 08/02/21  1821 ? ?  ? ?History ? ?No chief complaint on file. ? ? ?Audrey [name] is a 46 y.o. female. ? ?HPI ? ?This patient is a 46 year old female, she arrives by paramedic transport getting active CPR after having a cardiac arrest at home.  According to the PA paramedics the first responders found the patient to be unresponsive on the ground, they had pulses initially but then she lost pulses so they initiated CPR.  Initial rhythm found by paramedics was PEA, they give 7 rounds of epinephrine with return of spontaneous circulation.  The patient did not wake up, they placed an esophageal obturator airway and were able to help assist with ventilations.  She was initially given Narcan but did not seem to help, she had pinpoint pupils bilaterally.  Police have reported that there was a recent prescription for hydromorphone, there were 90 tablets, 27 were missing over the last 3 days.  The patient does have a tendency toward psychiatric illness and has had prior evidence of cutting on her left arm, again she is not able to answer questions that she is currently intubated. ? ?Home Medications ?Prior to Admission medications   ?Medication Sig Start Date End Date Taking? Authorizing Provider  ?albuterol (VENTOLIN HFA) 108 (90 Base) MCG/ACT inhaler Inhale 2 puffs into the lungs every 6 (six) hours as needed for wheezing or shortness of breath.    [provider]  ?clonazePAM (KLONOPIN) 1 MG tablet Take 1 mg by mouth 3 (three) times daily.    [provider]  ?DULoxetine (CYMBALTA) 60 MG capsule Take 60 mg by mouth in the morning, at noon, and at bedtime.    [provider]  ?gabapentin (NEURONTIN) 800 MG tablet Take 800 mg by mouth 3 (three) times daily.    [provider]  ?HYDROmorphone (DILAUDID) 2 MG tablet Take 2 mg by mouth See admin instructions. Take 2 mg by mouth at  2 AM, 8 AM, 2 PM, and 8 PM 05/06/21   [provider]  ?hydrOXYzine (VISTARIL) 100 MG capsule Take 100 mg by mouth at bedtime.    [provider]  ?lidocaine (LIDODERM) 5 % Place 1 patch onto the skin See admin instructions. Apply 1 patch to the lower back once a day- Remove & Discard patch within 12 hours or as directed by MD    [provider]  ?losartan (COZAAR) 25 MG tablet Take 25 mg by mouth daily.    [provider]  ?mirtazapine (REMERON SOL-TAB) 15 MG disintegrating tablet Take 30 mg by mouth at bedtime.    [provider]  ?naloxone [facility]) nasal spray 4 mg/0.1 mL Place 1 spray into the nose once as needed (for accidental overdose). 02/26/21   [provider]  ?ondansetron (ZOFRAN) 4 MG tablet Take 4 mg by mouth every 8 (eight) hours as needed for nausea or vomiting.    [provider]  ?QUEtiapine (SEROQUEL) 100 MG tablet Take 100 mg by mouth 3 (three) times daily.    [provider]  ?tiZANidine (ZANAFLEX) 4 MG tablet Take 1 tablet (4 mg total) by mouth every 12 (twelve) hours as needed for muscle spasms. ?Patient taking differently: Take 4 mg by mouth 3 (three) times daily. 11/30/20   McDonald, Mia A, PA-C  ?topiramate (TOPAMAX) 100 MG tablet Take 100 mg by mouth 2 (two) times daily.    [provider]  ?Ubrogepant (UBRELVY) 100 MG TABS Take 100 mg by mouth. Take 100 mg total (1 tablet) by mouth as needed. At onset of pain And may repeat in 2 hours if needed    [provider]  ?verapamil (VERELAN PM) 180 MG 24 hr capsule Take 180 mg by mouth daily.    [provider]  ?   ? ?Allergies    ?Penicillins, Prochlorperazine, Acetazolamide, Latex, Propoxyphene, and Nitroglycerin er   ? ?Review of Systems   ?Review of Systems  ?Unable to perform ROS: Acuity of condition  ? ?Physical Exam ?Updated Vital Signs ?There were no vitals taken for this visit. ?Physical Exam ?Constitutional:   ?   Comments: Ill-appearing,  GCS of 3  ?HENT:  ?   Head: Normocephalic and atraumatic.  ?   Nose: Nose normal.  ?   Mouth/Throat:  ?   Comments: Dry mucous membranes ?Eyes:  ?   Comments: Pupils 1 mm nonreactive bilaterally  ?Cardiovascular:  ?   Comments: Heart rate around 100 bpm, appears narrow complex on the monitor, has pedal pulses ?Pulmonary:  ?   Comments: Occasional respirations assisted with ventilation ?Abdominal:  ?   Comments: Morbidly obese, no masses  ?Musculoskeletal:  ?   Comments: Intraosseous line present in the left proximal tibia, no obvious injuries to the extremities, prior scar marks on the inside of the left arm  ?Lymphadenopathy:  ?   Cervical: No cervical adenopathy.  ?Skin: ?   General: Skin is warm and dry.  ?Neurological:  ?   Comments: GCS of 3  ? ? ?ED Results / Procedures / Treatments   ?Labs ?(all labs ordered are listed, but only abnormal results are displayed) ?Labs Reviewed  ?CBC  ?LACTIC ACID, PLASMA  ?MAGNESIUM  ?COMPREHENSIVE METABOLIC PANEL  ?RAPID URINE DRUG SCREEN, HOSP PERFORMED  ?URINALYSIS, ROUTINE W REFLEX MICROSCOPIC  ?TROPONIN I (HIGH SENSITIVITY)  ? ? ?EKG ?None ? ?Radiology ?No results found. ? ?Procedures ?Marland KitchenCritical Care ?Performed by: Noemi Chapel, MD ?Authorized by: Noemi Chapel, MD  ? ?Critical care provider statement:  ?  Critical care time (minutes):  30 ?  Critical care was necessary to treat or prevent imminent or life-threatening deterioration of the following conditions:  Cardiac failure and respiratory failure ?  Critical care was time spent personally by me on the following activities:  Development of treatment plan with patient or surrogate, discussions with consultants, evaluation of patient's response to treatment, examination of patient, ordering and review of laboratory studies, ordering and review of radiographic studies, ordering and performing treatments and interventions, pulse oximetry, re-evaluation of patient's condition, review of old charts and obtaining history from  patient or surrogate ?  I assumed direction of critical care for this patient from another provider in my specialty: no   ?  Care discussed with: admitting provider   ?Comments:  ?    ? ?Procedure Name: Intubation ?Date/Time: 08/02/2021 6:32 PM ?Performed by: Noemi Chapel, MD ?Pre-anesthesia Checklist: Patient identified, Patient being monitored, Emergency Drugs available, Timeout performed and Suction available ?Oxygen Delivery Method: Non-rebreather mask ?Preoxygenation: Pre-oxygenation with 100% oxygen ?Induction Type: Rapid sequence ?Ventilation: Mask ventilation without difficulty ?Laryngoscope Size: Mac and 4 ?Tube size: 7.5 mm ?Number of attempts: 1 ?Airway Equipment and Method: Stylet ?Placement Confirmation: ETT inserted through vocal cords under direct vision, CO2 detector and Breath sounds checked- equal and bilateral ?Secured at: 22 cm ?Tube secured with: ETT holder ?Dental Injury: Teeth and Oropharynx as per pre-operative assessment  ?Difficulty Due  To: Difficulty was unanticipated ?Comments:  ? ? ? ? ?CPR ? ?Date/Time: 08/02/2021 6:33 PM ?Performed by: Noemi Chapel, MD ?Authorized by: Noemi Chapel, MD  ?CPR Procedure Details:    ?  Amount of time prior to administration of ACLS/BLS (minutes):  10 ?  ACLS/BLS initiated by EMS: Yes   ?  CPR/ACLS performed in the ED: Yes   ?  Duration of CPR (minutes):  5 ?  Outcome: ROSC obtained    ?CPR performed via ACLS guidelines under my direct supervision.  See RN documentation for details including defibrillator use, medications, doses and timing. ?Comments:  ?    ?  ? ? ?Medications Ordered in ED ?Medications  ?0.9 %  sodium chloride infusion (has no administration in time range)  ? ? ?ED Course/ Medical Decision Making/ A&P ?  ?                        ?Medical Decision Making ?Amount and/or Complexity of Data Reviewed ?Labs: ordered. ?Radiology: ordered. ? ?Risk ?Prescription drug management. ?Decision regarding hospitalization. ? ? ?This patient presents to the  ED for concern of cardiac arrest, this involves an extensive number of treatment options, and is a complaint that carries with it a high risk of complications and morbidity.  The differential diagnosi

## 2021-08-02 NOTE — Procedures (Signed)
Central Venous Catheter Insertion Procedure Note ? ?Audrey [name]  ?[id number]  ?Jul 29, 1975 ? ?Date:08/02/21  ?Time:7:06 PM  ? ?Provider Performing:Vaiden Adames C Tamala Julian  ? ?Procedure: Insertion of Non-tunneled Central Venous Catheter(36556) with US guidance [id number])  ? ?Indication(s) ?Difficult access ? ?Consent ?Unable to obtain consent due to emergent nature of procedure. ? ?Anesthesia ?Topical only with 1% lidocaine  ? ?Timeout ?Verified patient identification, verified procedure, site/side was marked, verified correct patient position, special equipment/implants available, medications/allergies/relevant history reviewed, required imaging and test results available. ? ?Sterile Technique ?Maximal sterile technique including full sterile barrier drape, hand hygiene, sterile gown, sterile gloves, mask, hair covering, sterile ultrasound probe cover (if used). ? ?Procedure Description ?Area of catheter insertion was cleaned with chlorhexidine and draped in sterile fashion.  With real-time ultrasound guidance a central venous catheter was placed into the left internal jugular vein. Nonpulsatile blood flow and easy flushing noted in all ports.  The catheter was sutured in place and sterile dressing applied. ? ?Complications/Tolerance ?None; patient tolerated the procedure well. ?Chest X-ray is ordered to verify placement for internal jugular or subclavian cannulation.   Chest x-ray is not ordered for femoral cannulation. ? ?EBL ?Minimal ? ?Specimen(s) ?None ? ?

## 2021-08-02 NOTE — Code Documentation (Signed)
Patient pulseless and apneic at this time, CPR initiated. 2 minutes of CPR provided and pulses regained.  ?

## 2021-08-02 NOTE — Progress Notes (Signed)
Rt attempted to place radial aline twice on the RT side. Obtained flash but unable to pass catheter. ?

## 2021-08-02 NOTE — H&P (Signed)
? ?NAME:  Audrey [name], MRN:  [id number], DOB:  11-10-1975, LOS: 0 ?ADMISSION DATE:  08/02/2021, CONSULTATION DATE:  08/02/2021 ?REFERRING MD:  Dr. Sabra Heck , CHIEF COMPLAINT:  Cardiac arretst  ? ?History of Present Illness:  ?Audrey [name] is a 46 year old female with a past medical history significant for Pseudotumor cerebri, anxiety, depression, PTSD,, schizoaffective disorder, and chronic opioid use who presented to the ED after suffering outside hospital cardiac arrest.  Per EMS patient was last seen normal per spouse at 4 AM this morning.  She was later found by spouse this afternoon unresponsive prompting EMS call.  Of note patient large volume of hydromorphone tablets (27 tabs missing in 3 dys).  Prolonged arrest in field with 3 arrest on arrival to ED. ? ?Majority of ED work-up pending at time of critical care evaluation.  All lab work and imaging currently pending. ? ?Pertinent  Medical History  ?Pseudotumor cerebri, anxiety, depression, PTSD,, schizoaffective disorder, and chronic opioid use ? ?Significant Hospital Events: ?Including procedures, antibiotic start and stop dates in addition to other pertinent events   ?5/6 presented after out of hospital cardiac arrest ? ?Interim History / Subjective:  ?As above ? ?Objective   ?There were no vitals taken for this visit. ?PAP: ()/()   ?   ?No intake or output data in the 24 hours ending 08/02/21 1832 ?There were no vitals filed for this visit. ? ?Examination: ?General: Acute on chronic ill-appearing adult female lying in bed on mechanical ventilation in no acute distress ?HEENT: ETT, MM pink/moist, PERRL,  ?Neuro: Sedated on ventilator ?CV: s1s2 regular rate and rhythm, no murmur, rubs, or gallops,  ?PULM: Auscultation bilaterally, no increased work of breathing, no ?GI: soft, bowel sounds active in all 4 quadrants, non-tender, non-distended, ?Extremities: warm/dry, no edema  ?Skin: no rashes or lesions ? ? ?Resolved Hospital Problem list    ? ? ?Assessment & Plan:  ?Out of hospital cardiac arrest  ?-Unknown etiology at this time however high suspicion for possible unintentional versus intentional overdose  ?P: ?Normothermia protocol ?Place CVC and A-line ?Continue pressors for map goal greater than 65 ?Assess CVP's, echo, UDS. ?Trend troponin, lactate. ?Continuous telemetry ? ?Acute hypoxic respiratory failure  ?- Secondary to above ?P: ?Continue ventilator support with lung protective strategies  ?Wean PEEP and FiO2 for sats greater than 90%. ?Head of bed elevated 30 degrees. ?Plateau pressures less than 30 cm H20.  ?Follow intermittent chest x-ray and ABG.   ?SAT/SBT as tolerated, mentation preclude extubation  ?Ensure adequate pulmonary hygiene  ?Follow cultures  ?VAP bundle in place  ?PAD protocol ? ?At risk for anoxic encephalopathy ?Fall with right face lac with concern for head injury ?Hx of Pseudotumor cerebri ?-Unknown duration of downtime ?P: ?Minimize sedation ?Consider neuro consult  ?Maintain neuro protective measures; goal for eurothermia, euglycemia, eunatermia, normoxia, and PCO2 goal of 35-40 ?Nutrition and bowel regiment  ?Seizure precautions  ?Aspirations precautions  ?Consider EEG  ?Obtain head CT ? ?History of Schizoaffective disorder  ?-History of self-harm/suicide attempt in the past  ?P: ?Resume home medication regiment when appropriate  ?May need psych consult once extubated  ? ?Best Practice (right click and "Reselect all SmartList Selections" daily)  ? ?Diet/type: NPO ?DVT prophylaxis: prophylactic heparin  ?GI prophylaxis: PPI ?Lines: N/A ?Foley:  Yes, and it is still needed ?Code Status:  full code ?Last date of multidisciplinary goals of care discussion Pending family discussion  ? ?Labs   ?CBC: ?No results for input(s): WBC, NEUTROABS, HGB, HCT,  MCV, PLT in the last 168 hours. ? ?Basic Metabolic Panel: ?No results for input(s): NA, K, CL, CO2, GLUCOSE, BUN, CREATININE, CALCIUM, MG, PHOS in the last 168 hours. ?GFR: ?CrCl  cannot be calculated (Patient's most recent lab result is older than the maximum 21 days allowed.). ?No results for input(s): PROCALCITON, WBC, LATICACIDVEN in the last 168 hours. ? ?Liver Function Tests: ?No results for input(s): AST, ALT, ALKPHOS, BILITOT, PROT, ALBUMIN in the last 168 hours. ?No results for input(s): LIPASE, AMYLASE in the last 168 hours. ?No results for input(s): AMMONIA in the last 168 hours. ? ?ABG ?   ?Component Value Date/Time  ? HCO3 32.0 (H) 01/16/2021 0953  ? TCO2 34 (H) 01/16/2021 0953  ? O2SAT 50.0 01/16/2021 0953  ?  ? ?Coagulation Profile: ?No results for input(s): INR, PROTIME in the last 168 hours. ? ?Cardiac Enzymes: ?No results for input(s): CKTOTAL, CKMB, CKMBINDEX, TROPONINI in the last 168 hours. ? ?HbA1C: ?No results found for: HGBA1C ? ?CBG: ?No results for input(s): GLUCAP in the last 168 hours. ? ?Review of Systems:   ?Unable to assess  ? ?Past Medical History:  ?She,  has a past medical history of Anxiety, Arthritis, Depression, Heart murmur, Pseudotumor cerebri, PTSD (post-traumatic stress disorder), Schizo affective schizophrenia ([place]), and Sciatica.  ? ?Surgical History:  ? ?Past Surgical History:  ?Procedure Laterality Date  ? ABDOMINAL HYSTERECTOMY    ? ABDOMINAL SURGERY    ? APPENDECTOMY    ? CARDIAC CATHETERIZATION    ? CHOLECYSTECTOMY    ? KNEE SURGERY    ? TONSILLECTOMY    ? TUBAL LIGATION    ?  ? ?Social History:  ? reports that she has never smoked. She has never used smokeless tobacco. She reports that she does not drink alcohol and does not use drugs.  ? ?Family History:  ?Her family history is not on file.  ? ?Allergies ?Allergies  ?Allergen Reactions  ? Penicillins Anaphylaxis and Rash  ? Prochlorperazine Anaphylaxis and Nausea And Vomiting  ? Acetazolamide Other (See Comments)  ?  Joint pain  ? Latex Hives and Swelling  ? Propoxyphene Nausea And Vomiting  ? Nitroglycerin Er Swelling and Other (See Comments)  ?  Body swells, but no breathing issues  ?   ? ?Home Medications  ?Prior to Admission medications   ?Medication Sig Start Date End Date Taking? Authorizing Provider  ?albuterol (VENTOLIN HFA) 108 (90 Base) MCG/ACT inhaler Inhale 2 puffs into the lungs every 6 (six) hours as needed for wheezing or shortness of breath.    [provider]  ?clonazePAM (KLONOPIN) 1 MG tablet Take 1 mg by mouth 3 (three) times daily.    [provider]  ?DULoxetine (CYMBALTA) 60 MG capsule Take 60 mg by mouth in the morning, at noon, and at bedtime.    [provider]  ?gabapentin (NEURONTIN) 800 MG tablet Take 800 mg by mouth 3 (three) times daily.    [provider]  ?HYDROmorphone (DILAUDID) 2 MG tablet Take 2 mg by mouth See admin instructions. Take 2 mg by mouth at 2 AM, 8 AM, 2 PM, and 8 PM 05/06/21   [provider]  ?hydrOXYzine (VISTARIL) 100 MG capsule Take 100 mg by mouth at bedtime.    [provider]  ?lidocaine (LIDODERM) 5 % Place 1 patch onto the skin See admin instructions. Apply 1 patch to the lower back once a day- Remove & Discard patch within 12 hours or as directed by MD  [provider]  ?losartan (COZAAR) 25 MG tablet Take 25 mg by mouth daily.    [provider]  ?mirtazapine (REMERON SOL-TAB) 15 MG disintegrating tablet Take 30 mg by mouth at bedtime.    [provider]  ?naloxone [facility]) nasal spray 4 mg/0.1 mL Place 1 spray into the nose once as needed (for accidental overdose). 02/26/21   [provider]  ?ondansetron (ZOFRAN) 4 MG tablet Take 4 mg by mouth every 8 (eight) hours as needed for nausea or vomiting.    [provider]  ?QUEtiapine (SEROQUEL) 100 MG tablet Take 100 mg by mouth 3 (three) times daily.    [provider]  ?tiZANidine (ZANAFLEX) 4 MG tablet Take 1 tablet (4 mg total) by mouth every 12 (twelve) hours as needed for muscle spasms. ?Patient taking differently: Take 4 mg by mouth 3 (three) times daily. 11/30/20   McDonald, Mia  A, PA-C  ?topiramate (TOPAMAX) 100 MG tablet Take 100 mg by mouth 2 (two) times daily.    [provider]  ?Ubrogepant (UBRELVY) 100 MG TABS Take 100 mg by mouth. Take 100 mg total (1 tablet) by mouth as ne

## 2021-08-02 NOTE — Progress Notes (Signed)
eLink Physician-Brief Progress Note ?Patient Name: Audrey [name] ?DOB: 09/25/1975 ?MRN: [id number] ? ? ?Date of Service ? 08/02/2021  ?HPI/Events of Note ? Patient on 0.9 NaCl IV infusion at 250 mL/hour. Nursing questioning this order. CK= 109. Doubt rhabdo.   ?eICU Interventions ? Plan: ?Decrease 0.9 NaCl IV infusion to 125 mL/hour. ?Monitor CVP now and Q 4 hours.   ? ? ? ?Intervention Category ?Major Interventions: Other: ? ?Cleon Signorelli Dennard Nip ?08/02/2021, 10:31 PM ?

## 2021-08-03 ENCOUNTER — Inpatient Hospital Stay (HOSPITAL_COMMUNITY): Payer: BC Managed Care – PPO

## 2021-08-03 DIAGNOSIS — I5189 Other ill-defined heart diseases: Secondary | ICD-10-CM

## 2021-08-03 DIAGNOSIS — I469 Cardiac arrest, cause unspecified: Secondary | ICD-10-CM

## 2021-08-03 LAB — POCT I-STAT 7, (LYTES, BLD GAS, ICA,H+H)
Acid-base deficit: 10 mmol/L — ABNORMAL HIGH (ref 0.0–2.0)
Acid-base deficit: 6 mmol/L — ABNORMAL HIGH (ref 0.0–2.0)
Acid-base deficit: 8 mmol/L — ABNORMAL HIGH (ref 0.0–2.0)
Bicarbonate: 18.3 mmol/L — ABNORMAL LOW (ref 20.0–28.0)
Bicarbonate: 18.4 mmol/L — ABNORMAL LOW (ref 20.0–28.0)
Bicarbonate: 18.6 mmol/L — ABNORMAL LOW (ref 20.0–28.0)
Calcium, Ion: 1.16 mmol/L (ref 1.15–1.40)
Calcium, Ion: 1.16 mmol/L (ref 1.15–1.40)
Calcium, Ion: 1.17 mmol/L (ref 1.15–1.40)
HCT: 38 % (ref 36.0–46.0)
HCT: 39 % (ref 36.0–46.0)
HCT: 42 % (ref 36.0–46.0)
Hemoglobin: 12.9 g/dL (ref 12.0–15.0)
Hemoglobin: 13.3 g/dL (ref 12.0–15.0)
Hemoglobin: 14.3 g/dL (ref 12.0–15.0)
O2 Saturation: 94 %
O2 Saturation: 96 %
O2 Saturation: 99 %
Patient temperature: 34
Patient temperature: 35.7
Patient temperature: 36.9
Potassium: 4.3 mmol/L (ref 3.5–5.1)
Potassium: 4.4 mmol/L (ref 3.5–5.1)
Potassium: 4.6 mmol/L (ref 3.5–5.1)
Sodium: 138 mmol/L (ref 135–145)
Sodium: 138 mmol/L (ref 135–145)
Sodium: 139 mmol/L (ref 135–145)
TCO2: 20 mmol/L — ABNORMAL LOW (ref 22–32)
TCO2: 20 mmol/L — ABNORMAL LOW (ref 22–32)
TCO2: 20 mmol/L — ABNORMAL LOW (ref 22–32)
pCO2 arterial: 31.5 mmHg — ABNORMAL LOW (ref 32–48)
pCO2 arterial: 36.6 mmHg (ref 32–48)
pCO2 arterial: 43.4 mmHg (ref 32–48)
pH, Arterial: 7.216 — ABNORMAL LOW (ref 7.35–7.45)
pH, Arterial: 7.302 — ABNORMAL LOW (ref 7.35–7.45)
pH, Arterial: 7.379 (ref 7.35–7.45)
pO2, Arterial: 148 mmHg — ABNORMAL HIGH (ref 83–108)
pO2, Arterial: 72 mmHg — ABNORMAL LOW (ref 83–108)
pO2, Arterial: 86 mmHg (ref 83–108)

## 2021-08-03 LAB — CBC
HCT: 39.8 % (ref 36.0–46.0)
Hemoglobin: 12.7 g/dL (ref 12.0–15.0)
MCH: 31.6 pg (ref 26.0–34.0)
MCHC: 31.9 g/dL (ref 30.0–36.0)
MCV: 99 fL (ref 80.0–100.0)
Platelets: 164 10*3/uL (ref 150–400)
RBC: 4.02 MIL/uL (ref 3.87–5.11)
RDW: 14.7 % (ref 11.5–15.5)
WBC: 12.9 10*3/uL — ABNORMAL HIGH (ref 4.0–10.5)
nRBC: 0 % (ref 0.0–0.2)

## 2021-08-03 LAB — BLOOD CULTURE ID PANEL (REFLEXED) - BCID2

## 2021-08-03 LAB — BASIC METABOLIC PANEL
Anion gap: 9 (ref 5–15)
BUN: 14 mg/dL (ref 6–20)
CO2: 21 mmol/L — ABNORMAL LOW (ref 22–32)
Calcium: 8.2 mg/dL — ABNORMAL LOW (ref 8.9–10.3)
Chloride: 110 mmol/L (ref 98–111)
Creatinine, Ser: 1.87 mg/dL — ABNORMAL HIGH (ref 0.44–1.00)
GFR, Estimated: 33 mL/min — ABNORMAL LOW (ref >60)
Glucose, Bld: 97 mg/dL (ref 70–99)
Potassium: 4.7 mmol/L (ref 3.5–5.1)
Sodium: 140 mmol/L (ref 135–145)

## 2021-08-03 LAB — ECHOCARDIOGRAM COMPLETE
Area-P 1/2: 3.63 cm2
Height: 68 in
S' Lateral: 3.6 cm
Weight: 4874.81 oz

## 2021-08-03 LAB — URINE CULTURE: Culture: NO GROWTH

## 2021-08-03 LAB — MRSA NEXT GEN BY PCR, NASAL: MRSA by PCR Next Gen: DETECTED — AB

## 2021-08-03 LAB — TRIGLYCERIDES: Triglycerides: 427 mg/dL — ABNORMAL HIGH (ref <150)

## 2021-08-03 LAB — PHOSPHORUS: Phosphorus: 3.8 mg/dL (ref 2.5–4.6)

## 2021-08-03 LAB — GLUCOSE, CAPILLARY
Glucose-Capillary: 105 mg/dL — ABNORMAL HIGH (ref 70–99)
Glucose-Capillary: 117 mg/dL — ABNORMAL HIGH (ref 70–99)
Glucose-Capillary: 66 mg/dL — ABNORMAL LOW (ref 70–99)
Glucose-Capillary: 72 mg/dL (ref 70–99)
Glucose-Capillary: 89 mg/dL (ref 70–99)

## 2021-08-03 LAB — LACTIC ACID, PLASMA: Lactic Acid, Venous: 2.8 mmol/L (ref 0.5–1.9)

## 2021-08-03 LAB — MAGNESIUM: Magnesium: 1.9 mg/dL (ref 1.7–2.4)

## 2021-08-03 MED ORDER — DEXTROSE 50 % IV SOLN: 12.5000 g | INTRAVENOUS | Status: AC

## 2021-08-03 MED ORDER — MUPIROCIN 2 % EX OINT
1.0000 "application " | TOPICAL_OINTMENT | Freq: Two times a day (BID) | CUTANEOUS | Status: AC
  Administered 2021-08-03 – 2021-08-07 (×10): 1 via NASAL
  Filled 2021-08-03 (×2): qty 22

## 2021-08-03 MED ORDER — DEXTROSE 50 % IV SOLN
INTRAVENOUS | Status: AC
  Administered 2021-08-03: 12.5 g via INTRAVENOUS
  Filled 2021-08-03: qty 50

## 2021-08-03 MED ORDER — CHLORHEXIDINE GLUCONATE CLOTH 2 % EX PADS
6.0000 | MEDICATED_PAD | Freq: Every day | CUTANEOUS | Status: AC
  Administered 2021-08-02 – 2021-08-06 (×5): 6 via TOPICAL

## 2021-08-03 MED ORDER — VITAL HIGH PROTEIN PO LIQD
1000.0000 mL | ORAL | Status: DC
  Administered 2021-08-03: 1000 mL

## 2021-08-03 MED ORDER — CHLORHEXIDINE GLUCONATE 0.12% ORAL RINSE (MEDLINE KIT)
15.0000 mL | Freq: Two times a day (BID) | OROMUCOSAL | Status: DC
  Administered 2021-08-03 – 2021-08-05 (×6): 15 mL via OROMUCOSAL

## 2021-08-03 MED ORDER — LACTATED RINGERS IV SOLN: INTRAVENOUS | Status: AC

## 2021-08-03 MED ORDER — ORAL CARE MOUTH RINSE
15.0000 mL | OROMUCOSAL | Status: DC
  Administered 2021-08-03 – 2021-08-04 (×14): 15 mL via OROMUCOSAL

## 2021-08-03 MED ORDER — PROSOURCE TF PO LIQD
45.0000 mL | Freq: Two times a day (BID) | ORAL | Status: DC
  Administered 2021-08-03 – 2021-08-04 (×3): 45 mL
  Filled 2021-08-03 (×3): qty 45

## 2021-08-03 MED ORDER — PERFLUTREN LIPID MICROSPHERE
1.0000 mL | INTRAVENOUS | Status: AC | PRN
  Administered 2021-08-03: 2 mL via INTRAVENOUS
  Filled 2021-08-03: qty 10

## 2021-08-03 NOTE — Progress Notes (Signed)
PHARMACY - PHYSICIAN COMMUNICATION ?CRITICAL VALUE ALERT - BLOOD CULTURE IDENTIFICATION (BCID) ? ?Audrey [name] is an 46 y.o. female who presented to [facility] on 08/02/2021 as a out of hospital cardiac arrest ? ?Assessment:  1/3 (anaerobic) GPC in chains, staph epi mec a detected and strep species  ? ?Name of physician (or Provider) ContactedKatrinka Blazing MD ? ?Current antibiotics: Ceftriaxone  ? ?Changes to prescribed antibiotics recommended:  ?Patient is on recommended antibiotics - No changes needed. Will follow fever curve closely and adjust antibiotics as needed.  ? ?Results for orders placed or performed during the hospital encounter of 08/02/21  ?Blood Culture ID Panel (Reflexed) (Collected: 08/02/2021  6:46 PM)  ?Result Value Ref Range  ? Enterococcus faecalis NOT DETECTED NOT DETECTED  ? Enterococcus Faecium NOT DETECTED NOT DETECTED  ? Listeria monocytogenes NOT DETECTED NOT DETECTED  ? Staphylococcus species DETECTED (A) NOT DETECTED  ? Staphylococcus aureus (BCID) NOT DETECTED NOT DETECTED  ? Staphylococcus epidermidis DETECTED (A) NOT DETECTED  ? Staphylococcus lugdunensis NOT DETECTED NOT DETECTED  ? Streptococcus species DETECTED (A) NOT DETECTED  ? Streptococcus agalactiae NOT DETECTED NOT DETECTED  ? Streptococcus pneumoniae NOT DETECTED NOT DETECTED  ? Streptococcus pyogenes NOT DETECTED NOT DETECTED  ? A.calcoaceticus-baumannii NOT DETECTED NOT DETECTED  ? Bacteroides fragilis NOT DETECTED NOT DETECTED  ? Enterobacterales NOT DETECTED NOT DETECTED  ? Enterobacter cloacae complex NOT DETECTED NOT DETECTED  ? Escherichia coli NOT DETECTED NOT DETECTED  ? Klebsiella aerogenes NOT DETECTED NOT DETECTED  ? Klebsiella oxytoca NOT DETECTED NOT DETECTED  ? Klebsiella pneumoniae NOT DETECTED NOT DETECTED  ? Proteus species NOT DETECTED NOT DETECTED  ? Salmonella species NOT DETECTED NOT DETECTED  ? Serratia marcescens NOT DETECTED NOT DETECTED  ? Haemophilus influenzae NOT DETECTED NOT DETECTED  ?  Neisseria meningitidis NOT DETECTED NOT DETECTED  ? Pseudomonas aeruginosa NOT DETECTED NOT DETECTED  ? Stenotrophomonas maltophilia NOT DETECTED NOT DETECTED  ? Candida albicans NOT DETECTED NOT DETECTED  ? Candida auris NOT DETECTED NOT DETECTED  ? Candida glabrata NOT DETECTED NOT DETECTED  ? Candida krusei NOT DETECTED NOT DETECTED  ? Candida parapsilosis NOT DETECTED NOT DETECTED  ? Candida tropicalis NOT DETECTED NOT DETECTED  ? Cryptococcus neoformans/gattii NOT DETECTED NOT DETECTED  ? Methicillin resistance mecA/C DETECTED (A) NOT DETECTED  ? ?Sheppard Coil PharmD., BCPS ?Clinical Pharmacist ?08/03/2021 11:18 AM ? ?

## 2021-08-03 NOTE — Progress Notes (Signed)
EEG complete - results pending 

## 2021-08-03 NOTE — Progress Notes (Signed)
? ?  NAME:  Audrey [name], MRN:  [id number], DOB:  06-13-1975, LOS: 1 ?ADMISSION DATE:  08/02/2021, CONSULTATION DATE:  08/02/2021 ?REFERRING MD:  Dr. Hyacinth Meeker , CHIEF COMPLAINT:  Cardiac arrest  ? ?History of Present Illness:  ?Audrey [name] is a 46 year old female with a past medical history significant for Pseudotumor cerebri, anxiety, depression, PTSD,, schizoaffective disorder, and chronic opioid use who presented to the ED after suffering outside hospital cardiac arrest.  Per EMS patient was last seen normal per spouse at 4 AM this morning.  She was later found by spouse this afternoon unresponsive prompting EMS call.  Of note patient large volume of hydromorphone tablets (27 tabs missing in 3 dys).  Prolonged arrest in field with 3 arrest on arrival to ED. ? ?Majority of ED work-up pending at time of critical care evaluation.  All lab work and imaging currently pending. ? ?Pertinent  Medical History  ?Pseudotumor cerebri, anxiety, depression, PTSD,, schizoaffective disorder, and chronic opioid use ? ?Significant Hospital Events: ?Including procedures, antibiotic start and stop dates in addition to other pertinent events   ?5/6 presented after out of hospital cardiac arrest ? ?Interim History / Subjective:  ?Waking up per nursing. Husband at bedside, also says she woke up and was tracking. ? ?Objective   ?Blood pressure 111/88, pulse 97, temperature 98.1 ?F (36.7 ?C), resp. rate (!) 22, height 5\' 8"  (1.727 m), weight (!) 138.2 kg, SpO2 98 %. ?CVP:  [12 mmHg-27 mmHg] 19 mmHg  ?Vent Mode: PRVC ?FiO2 (%):  [50 %-100 %] 50 % ?Set Rate:  [18 bmp-22 bmp] 22 bmp ?Vt Set:  [510 mL] 510 mL ?PEEP:  [5 cmH20] 5 cmH20 ?Plateau Pressure:  [29 cmH20-42 cmH20] 33 cmH20  ? ?Intake/Output Summary (Last 24 hours) at 08/03/2021 0852 ?Last data filed at 08/03/2021 0800 ?Gross per 24 hour  ?Intake 2728.37 ml  ?Output 695 ml  ?Net 2033.37 ml  ? ?Filed Weights  ? 08/02/21 2023 08/02/21 2130 08/03/21 0400  ?Weight: 135 kg (!) 136.1  kg (!) 138.2 kg  ? ? ?Examination: ?No distress ?Pale ?Lungs with diminished sounds bases, otherwise clear ?Pupils reactive, corneals intact, withdraws x 4 ?Ext warm, no edema ?RASS -4 ? ?ABG benign ?Admit CXR aspiration type infiltrates ? ?Resolved Hospital Problem list   ? ? ?Assessment & Plan:  ?Out of hospital cardiac arrest  ?At risk for anoxic encephalopathy ?Aspiration pneumonitis, acute hypoxemic respiratory failure ?Fall with right face lac with concern for head injury- CT neck/head benign ?Hx of Pseudotumor cerebri ?History of Schizoaffective disorder  ?AKI- in setting of presumed low perfusion state ? ?Overall my suspicion given how quickly she woke up is that she had a pulse for majority of CPR. Has very difficult to palpate pulses in her groin and neck.  Husband thinks this may be an overdose.  Benzos in urine, do not think she received any here. ? ?- Gentle LR ?- Avoid fevers ?- Ceftriaxone x 5 days ?- Rest today, vent bundle, SAT/SBT in AM ?- Husband updated at bedside, requests psychiatry consult when patient off vent ? ?Best Practice (right click and "Reselect all SmartList Selections" daily)  ? ?Diet/type: start TF ?DVT prophylaxis: prophylactic heparin  ?GI prophylaxis: PPI ?Lines: yes, tough access ?Foley:  Yes, and it is still needed ?Code Status:  full code ?Last date of multidisciplinary goals of care discussion: IPAL note 10/03/21 5/6 ? ?34 min cc time ?7/6 MD PCCM ? ? ? ? ?

## 2021-08-03 NOTE — Progress Notes (Signed)
eLink Physician-Brief Progress Note ?Patient Name: Audrey [name] ?DOB: [birth year] ?MRN: [id number] ? ? ?Date of Service ? 08/03/2021  ?HPI/Events of Note ? ABG on 50%/PRVC 18/TV 510/P 5 = 7.30/36.6/148/18.4  ?eICU Interventions ? Plan: ?Increase PRVC rate to 22. ?Repeat ABG at 8 AM.  ? ? ? ?Intervention Category ?Major Interventions: Respiratory failure - evaluation and management;Acid-Base disturbance - evaluation and management ? ?Kong Packett Dennard Nip ?08/03/2021, 6:33 AM ?

## 2021-08-03 NOTE — Progress Notes (Signed)
Echocardiogram ?2D Echocardiogram has been performed. ? ?Warren Lacy Jonatha Gagen RDCS ?08/03/2021, 9:33 AM ?

## 2021-08-03 NOTE — Progress Notes (Addendum)
eLink Physician-Brief Progress Note ?Patient Name: Audrey [name] ?DOB: 1975/11/27 ?MRN: [id number] ? ? ?Date of Service ? 08/03/2021  ?HPI/Events of Note ? Multiple issues: 1. ABG on 60%/PRVC 18/TV 510/P 5 = 7.216/43.4/86/18.3. 2. Lactic Acid = 5.7 post code. Hgb = 14.3 and CVP = 14.  ?eICU Interventions ? Plan: ?Increase PRVC rate to 26. ?Repeat ABG at 3 AM. ?Continue to trend Lactic Acid.  ? ? ? ?Intervention Category ?Major Interventions: Respiratory failure - evaluation and management;Acid-Base disturbance - evaluation and management ? ?Wileen Duncanson Dennard Nip ?08/03/2021, 1:14 AM ?

## 2021-08-03 NOTE — Procedures (Signed)
Patient Name: Audrey [name]  ?MRN: [id number]  ?Epilepsy Attending: Charlsie Quest  ?Referring Physician/Provider: Talitha Givens, NP ?Date: 08/03/2021 ?Duration: 23.13 mins ? ?Patient history: 46yo F s/p cardiac arrest. EEG to evaluate for seizure ? ?Level of alertness: comatose ? ?AEDs during EEG study: Propofol ? ?Technical aspects: This EEG study was done with scalp electrodes positioned according to the 10-20 International system of electrode placement. Electrical activity was acquired at a sampling rate of 500Hz  and reviewed with a high frequency filter of 70Hz  and a low frequency filter of 1Hz . EEG data were recorded continuously and digitally stored.  ? ?Description: EEG showed continuous generalized 3 to 6 Hz theta-delta slowing admixed with 15-16hz  generalized beta activity. Hyperventilation and photic stimulation were not performed.    ? ?ABNORMALITY ?- Continuous slow, generalized ? ?IMPRESSION: ?This study is suggestive of moderate to severe diffuse encephalopathy, nonspecific etiology. No seizures or epileptiform discharges were seen throughout the recording. ? ?  ? ?

## 2021-08-04 ENCOUNTER — Inpatient Hospital Stay (HOSPITAL_COMMUNITY): Payer: BC Managed Care – PPO

## 2021-08-04 ENCOUNTER — Ambulatory Visit: Payer: Medicare Other | Admitting: Psychiatry

## 2021-08-04 DIAGNOSIS — I469 Cardiac arrest, cause unspecified: Secondary | ICD-10-CM | POA: Diagnosis not present

## 2021-08-04 LAB — CBC
HCT: 33.5 % — ABNORMAL LOW (ref 36.0–46.0)
Hemoglobin: 11.1 g/dL — ABNORMAL LOW (ref 12.0–15.0)
MCH: 31.5 pg (ref 26.0–34.0)
MCHC: 33.1 g/dL (ref 30.0–36.0)
MCV: 95.2 fL (ref 80.0–100.0)
Platelets: 147 10*3/uL — ABNORMAL LOW (ref 150–400)
RBC: 3.52 MIL/uL — ABNORMAL LOW (ref 3.87–5.11)
RDW: 15.4 % (ref 11.5–15.5)
WBC: 10.6 10*3/uL — ABNORMAL HIGH (ref 4.0–10.5)
nRBC: 0 % (ref 0.0–0.2)

## 2021-08-04 LAB — GLUCOSE, CAPILLARY
Glucose-Capillary: 102 mg/dL — ABNORMAL HIGH (ref 70–99)
Glucose-Capillary: 105 mg/dL — ABNORMAL HIGH (ref 70–99)
Glucose-Capillary: 107 mg/dL — ABNORMAL HIGH (ref 70–99)
Glucose-Capillary: 117 mg/dL — ABNORMAL HIGH (ref 70–99)
Glucose-Capillary: 96 mg/dL (ref 70–99)

## 2021-08-04 LAB — BASIC METABOLIC PANEL
Anion gap: 10 (ref 5–15)
BUN: 20 mg/dL (ref 6–20)
CO2: 19 mmol/L — ABNORMAL LOW (ref 22–32)
Calcium: 8.1 mg/dL — ABNORMAL LOW (ref 8.9–10.3)
Chloride: 109 mmol/L (ref 98–111)
Creatinine, Ser: 2.32 mg/dL — ABNORMAL HIGH (ref 0.44–1.00)
GFR, Estimated: 25 mL/min — ABNORMAL LOW (ref >60)
Glucose, Bld: 116 mg/dL — ABNORMAL HIGH (ref 70–99)
Potassium: 2.8 mmol/L — ABNORMAL LOW (ref 3.5–5.1)
Sodium: 138 mmol/L (ref 135–145)

## 2021-08-04 LAB — PHOSPHORUS: Phosphorus: 2.4 mg/dL — ABNORMAL LOW (ref 2.5–4.6)

## 2021-08-04 LAB — MAGNESIUM: Magnesium: 1.7 mg/dL (ref 1.7–2.4)

## 2021-08-04 LAB — TRIGLYCERIDES: Triglycerides: 309 mg/dL — ABNORMAL HIGH (ref <150)

## 2021-08-04 MED ORDER — DEXMEDETOMIDINE HCL IN NACL 400 MCG/100ML IV SOLN
0.0000 ug/kg/h | INTRAVENOUS | Status: DC
  Administered 2021-08-04: 0.4 ug/kg/h via INTRAVENOUS
  Administered 2021-08-04: 0.6 ug/kg/h via INTRAVENOUS
  Filled 2021-08-04 (×3): qty 100

## 2021-08-04 MED ORDER — ORAL CARE MOUTH RINSE
15.0000 mL | Freq: Two times a day (BID) | OROMUCOSAL | Status: DC
  Administered 2021-08-05 (×3): 15 mL via OROMUCOSAL

## 2021-08-04 MED ORDER — POTASSIUM CHLORIDE 20 MEQ PO PACK
40.0000 meq | PACK | ORAL | Status: AC
  Administered 2021-08-04 (×3): 40 meq
  Filled 2021-08-04 (×3): qty 2

## 2021-08-04 MED ORDER — PROSOURCE TF PO LIQD
45.0000 mL | Freq: Every day | ORAL | Status: DC
  Administered 2021-08-05 – 2021-08-06 (×2): 45 mL
  Filled 2021-08-04 (×2): qty 45

## 2021-08-04 MED ORDER — VITAL HIGH PROTEIN PO LIQD
1000.0000 mL | ORAL | Status: DC
  Administered 2021-08-04 – 2021-08-05 (×2): 1000 mL

## 2021-08-04 NOTE — Procedures (Signed)
Extubation Procedure Note ? ?Patient Details:   ?Name: Audrey [name] ?DOB: January 14, 1976 ?MRN: [id number] ?  ?Airway Documentation:  ?Airway 7.5 mm (Active)  ?Secured at (cm) 14 cm 08/04/21 1510  ?Measured From Lips 08/04/21 1510  ?Secured Location Left 08/04/21 1510  ?Secured By [employer] 08/04/21 1510  ?Tube Holder Repositioned Yes 08/04/21 1510  ?Prone position No 08/04/21 1510  ?Cuff Pressure (cm H2O) Clear OR 27-39 CmH2O 08/04/21 0802  ?Site Condition Dry 08/04/21 1510  ? ?Vent end date: (not recorded) Vent end time: (not recorded)  ? ?Evaluation ? O2 sats: stable throughout ?Complications: No apparent complications ?Patient did tolerate procedure well. ?Bilateral Breath Sounds: Diminished ?  ?No ? ?Patient was extubated to a 4L [place] per MD order. Cuff leak was heard. No stridor was noted. RN at the bedside during extubation. ? ?Darolyn Rua ?08/04/2021, 5:02 PM ? ?

## 2021-08-04 NOTE — Plan of Care (Signed)

## 2021-08-04 NOTE — Progress Notes (Signed)
? ?  NAME:  Audrey [name], MRN:  [id number], DOB:  1975-11-04, LOS: 2 ?ADMISSION DATE:  08/02/2021, CONSULTATION DATE:  08/02/2021 ?REFERRING MD:  Dr. Hyacinth Meeker , CHIEF COMPLAINT:  Cardiac arrest  ? ?History of Present Illness:  ?Audrey [name] is a 46 year old female with a past medical history significant for Pseudotumor cerebri, anxiety, depression, PTSD,, schizoaffective disorder, and chronic opioid use who presented to the ED after suffering outside hospital cardiac arrest.  Per EMS patient was last seen normal per spouse at 4 AM this morning.  She was later found by spouse this afternoon unresponsive prompting EMS call.  Of note patient large volume of hydromorphone tablets (27 tabs missing in 3 dys).  Prolonged arrest in field with 3 arrest on arrival to ED. ? ?Majority of ED work-up pending at time of critical care evaluation.  All lab work and imaging currently pending. ? ?Pertinent  Medical History  ?Pseudotumor cerebri, anxiety, depression, PTSD,, schizoaffective disorder, and chronic opioid use ? ?Significant Hospital Events: ?Including procedures, antibiotic start and stop dates in addition to other pertinent events   ?5/6 presented after out of hospital cardiac arrest ? ?Interim History / Subjective:  ?Quiet night, husband at bedside. ?Heavily sedated at present. ? ?Objective   ?Blood pressure 93/63, pulse 78, temperature 97.9 ?F (36.6 ?C), resp. rate (!) 22, height 5\' 8"  (1.727 m), weight (!) 141 kg, SpO2 98 %. ?CVP:  [11 mmHg-56 mmHg] 11 mmHg  ?Vent Mode: PRVC ?FiO2 (%):  [40 %] 40 % ?Set Rate:  [22 bmp] 22 bmp ?Vt Set:  [510 mL] 510 mL ?PEEP:  [5 cmH20] 5 cmH20 ?Plateau Pressure:  [24 cmH20-28 cmH20] 26 cmH20  ? ?Intake/Output Summary (Last 24 hours) at 08/04/2021 0718 ?Last data filed at 08/04/2021 0600 ?Gross per 24 hour  ?Intake 5223.27 ml  ?Output 1985 ml  ?Net 3238.27 ml  ? ? ?Filed Weights  ? 08/02/21 2130 08/03/21 0400 08/04/21 0400  ?Weight: (!) 136.1 kg (!) 138.2 kg (!) 141 kg   ? ? ?Examination: ?No distress ?Pale ?Lungs with diminished sounds bases, otherwise clear, thick endotracheal secretions ?Pupils reactive, corneals intact, withdraws x 4, same ?Ext warm, no edema ?RASS -5 ? ?K low being repleted ?Cr up a bit ?TG up but not too high ?CBC stable ? ?Resolved Hospital Problem list   ? ? ?Assessment & Plan:  ?Out of hospital cardiac arrest  ?At risk for anoxic encephalopathy ?Aspiration pneumonitis, acute hypoxemic respiratory failure ?Fall with right face lac with concern for head injury- CT neck/head benign ?Hx of Pseudotumor cerebri ?History of Schizoaffective disorder  ?AKI- in setting of presumed low perfusion state, on LR, does not appear fluid overloaded ? ?- Gentle LR ?- Avoid fevers ?- Ceftriaxone x 5 days ?- SAT/SBT this AM work toward extubation ?- Husband updated at bedside, requests psychiatry consult when patient off vent ? ?Best Practice (right click and "Reselect all SmartList Selections" daily)  ? ?Diet/type: start TF ?DVT prophylaxis: prophylactic heparin  ?GI prophylaxis: PPI ?Lines: yes, tough access ?Foley:  Yes, and it is still needed ?Code Status:  full code ?Last date of multidisciplinary goals of care discussion: IPAL note 10/04/21 5/6 ? ?31 min cc time ?7/6 MD PCCM ? ? ? ? ?

## 2021-08-04 NOTE — Progress Notes (Signed)
Initial Nutrition Assessment ? ?DOCUMENTATION CODES:  ? ?Morbid obesity ? ?INTERVENTION:  ? ?Tube Feeding via OG: ?Vital High Protein at 65 ml/hr ?Pro-Source TF 45 mL daily ?This provides 148 g of protein, 1600 kcals and 1310 mL of free water ? ?Additional kcals from Propofol ? ?NUTRITION DIAGNOSIS:  ? ?Inadequate oral intake related to acute illness as evidenced by NPO status. ? ?GOAL:  ? ?Patient will meet greater than or equal to 90% of their needs ? ? ?MONITOR:  ? ?Vent status, Labs, TF tolerance, Weight trends ? ?REASON FOR ASSESSMENT:  ? ?Consult, Ventilator ?Enteral/tube feeding initiation and management ? ?ASSESSMENT:  ? ?46 yo female admitted post OOH arrest with unknown total downtime-prolonged arrest in field with 3 arrest on arrival. PMH includes depression, anxiety, PTSD, schizoaffective disorder, chronic opiod use, pseudotumor cerebri ? ?Pt remains on vent support, requiring levophed ? ?OG tube in stomach per abd xray ?Adult TF protocol initiated yesterday; tolerating Vital High Protein at 40 ml/hr. Held this AM with weaning but plan to resume ? ?Unable to obtain diet and weight history ? ?UDS positive for opiates and benzo. Noted pt with large volume of hydromorphone tablets (90 pills filled 3 days prior to admission with 27 pills gone) ? ?Current wt 141 kg; admit weight 136 kg, Net +5L ? ?Labs: potassium 2.8 (L), magnesium wdl, phosphorus 2.4 (L) ?Meds: colace, miralax, KCL ? ? ?Diet Order:   ?Diet Order   ? ?       ?  Diet NPO time specified  Diet effective now       ?  ? ?  ?  ? ?  ? ? ?EDUCATION NEEDS:  ? ?Not appropriate for education at this time ? ?Skin:  Skin Assessment: Reviewed RN Assessment ? ?Last BM:  PTA ? ?Height:  ? ?Ht Readings from Last 1 Encounters:  ?08/02/21 5\' 8"  (1.727 m)  ? ? ?Weight:  ? ?Wt Readings from Last 1 Encounters:  ?08/04/21 (!) 141 kg  ? ? ? ?BMI:  Body mass index is 47.26 kg/m?. ? ?Estimated Nutritional Needs:  ? ?Kcal:  1500-1700 kcals ? ?Protein:  130-160  g ? ?Fluid:  >/= 1.7 L ? ?10/04/21 MS, RDN, LDN, CNSC ?Registered Dietitian III ?Clinical Nutrition ?RD Pager and On-Call Pager Number Located in [place]  ? ?

## 2021-08-05 ENCOUNTER — Inpatient Hospital Stay (HOSPITAL_COMMUNITY): Payer: BC Managed Care – PPO

## 2021-08-05 DIAGNOSIS — J9601 Acute respiratory failure with hypoxia: Secondary | ICD-10-CM | POA: Diagnosis not present

## 2021-08-05 DIAGNOSIS — J9602 Acute respiratory failure with hypercapnia: Secondary | ICD-10-CM

## 2021-08-05 DIAGNOSIS — Z930 Tracheostomy status: Secondary | ICD-10-CM

## 2021-08-05 DIAGNOSIS — I5021 Acute systolic (congestive) heart failure: Secondary | ICD-10-CM

## 2021-08-05 DIAGNOSIS — I469 Cardiac arrest, cause unspecified: Secondary | ICD-10-CM | POA: Diagnosis not present

## 2021-08-05 LAB — POCT I-STAT 7, (LYTES, BLD GAS, ICA,H+H)
Acid-base deficit: 3 mmol/L — ABNORMAL HIGH (ref 0.0–2.0)
Acid-base deficit: 4 mmol/L — ABNORMAL HIGH (ref 0.0–2.0)
Acid-base deficit: 8 mmol/L — ABNORMAL HIGH (ref 0.0–2.0)
Acid-base deficit: 9 mmol/L — ABNORMAL HIGH (ref 0.0–2.0)
Bicarbonate: 20.4 mmol/L (ref 20.0–28.0)
Bicarbonate: 21 mmol/L (ref 20.0–28.0)
Bicarbonate: 21.9 mmol/L (ref 20.0–28.0)
Bicarbonate: 22 mmol/L (ref 20.0–28.0)
Calcium, Ion: 1.27 mmol/L (ref 1.15–1.40)
Calcium, Ion: 1.28 mmol/L (ref 1.15–1.40)
Calcium, Ion: 1.29 mmol/L (ref 1.15–1.40)
Calcium, Ion: 1.3 mmol/L (ref 1.15–1.40)
HCT: 31 % — ABNORMAL LOW (ref 36.0–46.0)
HCT: 32 % — ABNORMAL LOW (ref 36.0–46.0)
HCT: 37 % (ref 36.0–46.0)
HCT: 38 % (ref 36.0–46.0)
Hemoglobin: 10.5 g/dL — ABNORMAL LOW (ref 12.0–15.0)
Hemoglobin: 10.9 g/dL — ABNORMAL LOW (ref 12.0–15.0)
Hemoglobin: 12.6 g/dL (ref 12.0–15.0)
Hemoglobin: 12.9 g/dL (ref 12.0–15.0)
O2 Saturation: 85 %
O2 Saturation: 98 %
O2 Saturation: 98 %
O2 Saturation: 99 %
Patient temperature: 37.2
Patient temperature: 98
Patient temperature: 98.4
Patient temperature: 98.6
Potassium: 5 mmol/L (ref 3.5–5.1)
Potassium: 5.1 mmol/L (ref 3.5–5.1)
Potassium: 5.6 mmol/L — ABNORMAL HIGH (ref 3.5–5.1)
Potassium: 6.1 mmol/L — ABNORMAL HIGH (ref 3.5–5.1)
Sodium: 140 mmol/L (ref 135–145)
Sodium: 140 mmol/L (ref 135–145)
Sodium: 142 mmol/L (ref 135–145)
Sodium: 142 mmol/L (ref 135–145)
TCO2: 22 mmol/L (ref 22–32)
TCO2: 23 mmol/L (ref 22–32)
TCO2: 23 mmol/L (ref 22–32)
TCO2: 24 mmol/L (ref 22–32)
pCO2 arterial: 35.6 mmHg (ref 32–48)
pCO2 arterial: 39.7 mmHg (ref 32–48)
pCO2 arterial: 56.7 mmHg — ABNORMAL HIGH (ref 32–48)
pCO2 arterial: 73.4 mmHg (ref 32–48)
pH, Arterial: 7.086 — CL (ref 7.35–7.45)
pH, Arterial: 7.177 — CL (ref 7.35–7.45)
pH, Arterial: 7.35 (ref 7.35–7.45)
pH, Arterial: 7.365 (ref 7.35–7.45)
pO2, Arterial: 110 mmHg — ABNORMAL HIGH (ref 83–108)
pO2, Arterial: 117 mmHg — ABNORMAL HIGH (ref 83–108)
pO2, Arterial: 136 mmHg — ABNORMAL HIGH (ref 83–108)
pO2, Arterial: 71 mmHg — ABNORMAL LOW (ref 83–108)

## 2021-08-05 LAB — CBC
HCT: 34.8 % — ABNORMAL LOW (ref 36.0–46.0)
Hemoglobin: 10.9 g/dL — ABNORMAL LOW (ref 12.0–15.0)
MCH: 31.4 pg (ref 26.0–34.0)
MCHC: 31.3 g/dL (ref 30.0–36.0)
MCV: 100.3 fL — ABNORMAL HIGH (ref 80.0–100.0)
Platelets: 141 10*3/uL — ABNORMAL LOW (ref 150–400)
RBC: 3.47 MIL/uL — ABNORMAL LOW (ref 3.87–5.11)
RDW: 16 % — ABNORMAL HIGH (ref 11.5–15.5)
WBC: 9.7 10*3/uL (ref 4.0–10.5)
nRBC: 0 % (ref 0.0–0.2)

## 2021-08-05 LAB — GLUCOSE, CAPILLARY
Glucose-Capillary: 67 mg/dL — ABNORMAL LOW (ref 70–99)
Glucose-Capillary: 74 mg/dL (ref 70–99)
Glucose-Capillary: 94 mg/dL (ref 70–99)
Glucose-Capillary: 114 mg/dL — ABNORMAL HIGH (ref 70–99)
Glucose-Capillary: 103 mg/dL — ABNORMAL HIGH (ref 70–99)

## 2021-08-05 LAB — BASIC METABOLIC PANEL
Anion gap: 8 (ref 5–15)
BUN: 22 mg/dL — ABNORMAL HIGH (ref 6–20)
CO2: 21 mmol/L — ABNORMAL LOW (ref 22–32)
Calcium: 9 mg/dL (ref 8.9–10.3)
Chloride: 113 mmol/L — ABNORMAL HIGH (ref 98–111)
Creatinine, Ser: 1.83 mg/dL — ABNORMAL HIGH (ref 0.44–1.00)
GFR, Estimated: 34 mL/min — ABNORMAL LOW (ref >60)
Glucose, Bld: 109 mg/dL — ABNORMAL HIGH (ref 70–99)
Potassium: 5.2 mmol/L — ABNORMAL HIGH (ref 3.5–5.1)
Sodium: 142 mmol/L (ref 135–145)

## 2021-08-05 LAB — MAGNESIUM: Magnesium: 2.1 mg/dL (ref 1.7–2.4)

## 2021-08-05 LAB — PHOSPHORUS: Phosphorus: 3.9 mg/dL (ref 2.5–4.6)

## 2021-08-05 MED ORDER — CHLORHEXIDINE GLUCONATE 0.12% ORAL RINSE (MEDLINE KIT)
15.0000 mL | Freq: Two times a day (BID) | OROMUCOSAL | Status: DC
  Administered 2021-08-06: 15 mL via OROMUCOSAL

## 2021-08-05 MED ORDER — ROCURONIUM BROMIDE 10 MG/ML (PF) SYRINGE
PREFILLED_SYRINGE | INTRAVENOUS | Status: AC
Start: 2021-08-05 — End: 2021-08-05
  Administered 2021-08-05: 60 mg via INTRAVENOUS
  Filled 2021-08-05: qty 10

## 2021-08-05 MED ORDER — ETOMIDATE 2 MG/ML IV SOLN
INTRAVENOUS | Status: AC
Start: 2021-08-05 — End: 2021-08-05
  Administered 2021-08-05: 10 mg via INTRAVENOUS
  Filled 2021-08-05: qty 20

## 2021-08-05 MED ORDER — DEXTROSE 50 % IV SOLN: 12.5000 g | INTRAVENOUS | Status: AC

## 2021-08-05 MED ORDER — FUROSEMIDE 10 MG/ML IJ SOLN
60.0000 mg | Freq: Three times a day (TID) | INTRAMUSCULAR | Status: DC
Start: 2021-08-05 — End: 2021-08-06
  Administered 2021-08-05 – 2021-08-06 (×4): 60 mg via INTRAVENOUS
  Filled 2021-08-05 (×4): qty 6

## 2021-08-05 MED ORDER — FENTANYL 2500MCG IN NS 250ML (10MCG/ML) PREMIX INFUSION
50.0000 ug/h | INTRAVENOUS | Status: DC
  Administered 2021-08-05: 50 ug/h via INTRAVENOUS
  Filled 2021-08-05: qty 250

## 2021-08-05 MED ORDER — ROCURONIUM BROMIDE 50 MG/5ML IV SOLN: 60.0000 mg | Freq: Once | INTRAVENOUS | Status: AC

## 2021-08-05 MED ORDER — DEXMEDETOMIDINE HCL IN NACL 400 MCG/100ML IV SOLN
0.4000 ug/kg/h | INTRAVENOUS | Status: DC
  Administered 2021-08-05: 0.2 ug/kg/h via INTRAVENOUS
  Administered 2021-08-05: 0.8 ug/kg/h via INTRAVENOUS
  Administered 2021-08-05: 0.4 ug/kg/h via INTRAVENOUS
  Administered 2021-08-05: 0.6 ug/kg/h via INTRAVENOUS
  Administered 2021-08-06 (×3): 0.9 ug/kg/h via INTRAVENOUS
  Filled 2021-08-05 (×6): qty 100

## 2021-08-05 MED ORDER — MIDAZOLAM HCL 2 MG/2ML IJ SOLN
2.0000 mg | INTRAMUSCULAR | Status: DC | PRN
  Administered 2021-08-05: 2 mg via INTRAVENOUS
  Filled 2021-08-05: qty 2

## 2021-08-05 MED ORDER — DOCUSATE SODIUM 50 MG/5ML PO LIQD
100.0000 mg | Freq: Two times a day (BID) | ORAL | Status: DC
  Administered 2021-08-05 – 2021-08-06 (×4): 100 mg
  Filled 2021-08-05 (×4): qty 10

## 2021-08-05 MED ORDER — MIDAZOLAM HCL 2 MG/2ML IJ SOLN
INTRAMUSCULAR | Status: AC
  Administered 2021-08-05: 2 mg via INTRAVENOUS
  Filled 2021-08-05: qty 2

## 2021-08-05 MED ORDER — DEXTROSE 50 % IV SOLN
INTRAVENOUS | Status: AC
  Administered 2021-08-05: 12.5 g via INTRAVENOUS
  Filled 2021-08-05: qty 50

## 2021-08-05 MED ORDER — FENTANYL 2500MCG IN NS 250ML (10MCG/ML) PREMIX INFUSION
0.0000 ug/h | INTRAVENOUS | Status: DC
  Administered 2021-08-05: 50 ug/h via INTRAVENOUS
  Filled 2021-08-05 (×2): qty 250

## 2021-08-05 MED ORDER — FENTANYL CITRATE PF 50 MCG/ML IJ SOSY
25.0000 ug | PREFILLED_SYRINGE | INTRAMUSCULAR | Status: DC | PRN
  Administered 2021-08-05 (×2): 50 ug via INTRAVENOUS
  Filled 2021-08-05: qty 2

## 2021-08-05 MED ORDER — MIDAZOLAM HCL 2 MG/2ML IJ SOLN: 2.0000 mg | Freq: Once | INTRAMUSCULAR | Status: AC

## 2021-08-05 MED ORDER — FENTANYL CITRATE PF 50 MCG/ML IJ SOSY
PREFILLED_SYRINGE | INTRAMUSCULAR | Status: AC
  Administered 2021-08-05: 50 ug via INTRAVENOUS
  Filled 2021-08-05: qty 2

## 2021-08-05 MED ORDER — PROPOFOL 1000 MG/100ML IV EMUL
0.0000 ug/kg/min | INTRAVENOUS | Status: DC
  Administered 2021-08-05: 50 ug/kg/min via INTRAVENOUS
  Administered 2021-08-05: 40 ug/kg/min via INTRAVENOUS
  Administered 2021-08-05: 30 ug/kg/min via INTRAVENOUS
  Filled 2021-08-05 (×3): qty 100

## 2021-08-05 MED ORDER — QUETIAPINE FUMARATE 100 MG PO TABS: 100.0000 mg | ORAL_TABLET | Freq: Every day | ORAL | Status: DC

## 2021-08-05 MED ORDER — TIZANIDINE HCL 4 MG PO TABS
2.0000 mg | ORAL_TABLET | Freq: Two times a day (BID) | ORAL | Status: DC
  Administered 2021-08-05 – 2021-08-06 (×3): 2 mg via NASOGASTRIC
  Filled 2021-08-05 (×2): qty 1

## 2021-08-05 MED ORDER — TIZANIDINE HCL 4 MG PO TABS
2.0000 mg | ORAL_TABLET | Freq: Two times a day (BID) | ORAL | Status: DC
  Filled 2021-08-05: qty 1

## 2021-08-05 MED ORDER — SUCCINYLCHOLINE CHLORIDE 200 MG/10ML IV SOSY
PREFILLED_SYRINGE | INTRAVENOUS | Status: AC
  Filled 2021-08-05: qty 10

## 2021-08-05 MED ORDER — FENTANYL BOLUS VIA INFUSION
50.0000 ug | INTRAVENOUS | Status: DC | PRN
  Filled 2021-08-05: qty 100

## 2021-08-05 MED ORDER — POLYETHYLENE GLYCOL 3350 17 G PO PACK
17.0000 g | PACK | Freq: Every day | ORAL | Status: DC
  Administered 2021-08-05 – 2021-08-06 (×2): 17 g
  Filled 2021-08-05 (×2): qty 1

## 2021-08-05 MED ORDER — ORAL CARE MOUTH RINSE
15.0000 mL | OROMUCOSAL | Status: DC
  Administered 2021-08-06 (×5): 15 mL via OROMUCOSAL

## 2021-08-05 MED ORDER — GABAPENTIN 250 MG/5ML PO SOLN
200.0000 mg | Freq: Three times a day (TID) | ORAL | Status: DC
  Administered 2021-08-05 – 2021-08-06 (×2): 200 mg
  Filled 2021-08-05 (×4): qty 4

## 2021-08-05 MED ORDER — PHENYLEPHRINE 80 MCG/ML (10ML) SYRINGE FOR IV PUSH (FOR BLOOD PRESSURE SUPPORT)
PREFILLED_SYRINGE | INTRAVENOUS | Status: AC
  Filled 2021-08-05: qty 10

## 2021-08-05 MED ORDER — GABAPENTIN 250 MG/5ML PO SOLN
200.0000 mg | Freq: Three times a day (TID) | ORAL | Status: DC
  Administered 2021-08-05: 200 mg via ORAL
  Filled 2021-08-05 (×2): qty 4

## 2021-08-05 MED ORDER — FENTANYL CITRATE PF 50 MCG/ML IJ SOSY: 50.0000 ug | PREFILLED_SYRINGE | Freq: Once | INTRAMUSCULAR | Status: AC

## 2021-08-05 MED ORDER — QUETIAPINE FUMARATE 100 MG PO TABS
100.0000 mg | ORAL_TABLET | Freq: Every day | ORAL | Status: DC
  Administered 2021-08-05: 100 mg
  Filled 2021-08-05: qty 1

## 2021-08-05 MED ORDER — ETOMIDATE 2 MG/ML IV SOLN: 10.0000 mg | Freq: Once | INTRAVENOUS | Status: AC

## 2021-08-05 MED ORDER — KETAMINE HCL 50 MG/5ML IJ SOSY
PREFILLED_SYRINGE | INTRAMUSCULAR | Status: AC
  Filled 2021-08-05: qty 5

## 2021-08-05 MED ORDER — ALBUTEROL SULFATE (2.5 MG/3ML) 0.083% IN NEBU
2.5000 mg | INHALATION_SOLUTION | RESPIRATORY_TRACT | Status: DC | PRN
  Administered 2021-08-05: 2.5 mg via RESPIRATORY_TRACT
  Filled 2021-08-05: qty 3

## 2021-08-05 NOTE — Progress Notes (Signed)
eLink Physician-Brief Progress Note ?Patient Name: Audrey [name] ?DOB: 06-Oct-1975 ?MRN: [id number] ? ? ?Date of Service ? 08/05/2021  ?HPI/Events of Note ? Patient recently extubated, now with acute hypercapnic respiratory failure and profound respiratory acidosis (PH 7.08). She is receiving a trial of BIPAP but due to her somnolence she is not effectively triggering the BIPAP machine.  ?eICU Interventions ? PCCM ground crew requested to come and intubate the patient,  anesthesia contacted for back-up.  ? ? ? ?  ? ?Audrey [name] ?08/05/2021, 12:22 AM ?

## 2021-08-05 NOTE — Progress Notes (Signed)
eLink Physician-Brief Progress Note ?Patient Name: Audrey [name] ?DOB: 05/03/1975 ?MRN: [id number] ? ? ?Date of Service ? 08/05/2021  ?HPI/Events of Note ? ABG reviewed - respiratory acidosis.  ?eICU Interventions ? RT has increased the rate to 28 and will obtain interval ABG.  ? ? ? ?  ? ?Thomasene Lot Audrey [name] ?08/05/2021, 1:58 AM ?

## 2021-08-05 NOTE — Procedures (Signed)
Intubation Procedure Note ? ?Audrey [name]  ?[id number]  ?[birth year] ? ?Date:08/05/21  ?Time:12:34 AM  ? ?Provider Performing:Toan Mort  ? ? ?Procedure: Intubation (31500) ? ?Indication(s) ?Respiratory Failure ? ?Consent ?Unable to obtain consent due to emergent nature of procedure. ? ? ?Anesthesia ?Etomidate, Versed, Fentanyl, and Rocuronium ? ? ?Time Out ?Verified patient identification, verified procedure, site/side was marked, verified correct patient position, special equipment/implants available, medications/allergies/relevant history reviewed, required imaging and test results available. ? ? ?Sterile Technique ?Usual hand hygeine, masks, and gloves were used ? ? ?Procedure Description ?Patient positioned in bed supine.  Sedation given as noted above.  Patient was intubated with endotracheal tube using Glidescope.  View was Grade 3 only epiglottis .  Number of attempts was 1.  Colorimetric CO2 detector was consistent with tracheal placement.  Inserted 7.5 ETT to 23 cm at lip. ? ? ?Complications/Tolerance ?None; patient tolerated the procedure well. ?Chest X-ray is ordered to verify placement. ? ? ?EBL ?None ? ? ?Specimen(s) ?None ? ?Coralyn Helling, MD ?[facility] Pulmonary/Critical Care ?Pager - [phone] ?08/05/2021, 12:35 AM ? ?

## 2021-08-05 NOTE — Progress Notes (Signed)
Pt admitted with cardiac arrest and required intubation.  She has acute systolic CHF.  Extubated on 5/08.  She developed worsening mental status after extubation.  Shallow respiratory efforts.  Obtunded, and moans some with brisk stimulation.  Not following commands.  ABG showed acute hypercapnic respiratory failure with hypoxia.  Mental status would not support use of Bipap.  Patient required reintubation.  ? ?CC time independent of procedure time 32 minutes. ? ?Coralyn Helling, MD ?[facility] Pulmonary/Critical Care ?Pager - [phone] - [phone] ?08/05/2021, 12:38 AM ? ?

## 2021-08-05 NOTE — Progress Notes (Signed)
08/05/2021 ? ?Events overnight reviewed with husband. ?Was confused then could not handle secretion burden, worsening distress and reintubated a little after midnight. ? ?CXR with aspiration, low lung volumes similar to prior. ? ?Exam unchanged but heavily sedated this am. ? ?Will give more time, try to get her to RASS -1 to 0 with precedex and PRN fentanyl. ? ?Start diuretic challenge. ? ?Next extubation will be directly to BIPAP, hopefully after some fluid off. ? ?Husband updated at length. ? ?Additional 45 min cc time ?Myrla Halsted MD PCCM ?

## 2021-08-05 NOTE — Progress Notes (Signed)
ET Tube pulled back from 23cm to 21 cm per Dr.Sood.  ?

## 2021-08-05 NOTE — Progress Notes (Signed)
eLink Physician-Brief Progress Note ?Patient Name: Audrey [name] ?DOB: [birth year] ?MRN: [id number] ? ? ?Date of Service ? 08/05/2021  ?HPI/Events of Note ? Patient with sub-optimal sedation on the ventilator, she is at risk for self-extubation, Fentanyl gtt ordered.  ?eICU Interventions ? See above.  ? ? ? ?  ? ?Audrey [name] ?08/05/2021, 9:25 PM ?

## 2021-08-05 NOTE — Progress Notes (Signed)
Pt experiencing further respiratory distress requiring reintubation. The following medications were given at 0025: ? ?50 mcg fentanyl ?2 mg versed ?10 mg etomidate ?60 mg rocuronium ? ?Pt tolerated procedure well; CXR pending. ?

## 2021-08-06 DIAGNOSIS — J690 Pneumonitis due to inhalation of food and vomit: Secondary | ICD-10-CM

## 2021-08-06 DIAGNOSIS — I469 Cardiac arrest, cause unspecified: Secondary | ICD-10-CM | POA: Diagnosis not present

## 2021-08-06 DIAGNOSIS — N179 Acute kidney failure, unspecified: Secondary | ICD-10-CM

## 2021-08-06 DIAGNOSIS — Z8659 Personal history of other mental and behavioral disorders: Secondary | ICD-10-CM

## 2021-08-06 LAB — CULTURE, BLOOD (ROUTINE X 2)

## 2021-08-06 LAB — CBC
HCT: 32.7 % — ABNORMAL LOW (ref 36.0–46.0)
Hemoglobin: 10.5 g/dL — ABNORMAL LOW (ref 12.0–15.0)
MCH: 31.3 pg (ref 26.0–34.0)
MCHC: 32.1 g/dL (ref 30.0–36.0)
MCV: 97.3 fL (ref 80.0–100.0)
Platelets: 133 10*3/uL — ABNORMAL LOW (ref 150–400)
RBC: 3.36 MIL/uL — ABNORMAL LOW (ref 3.87–5.11)
RDW: 15.9 % — ABNORMAL HIGH (ref 11.5–15.5)
WBC: 7 10*3/uL (ref 4.0–10.5)
nRBC: 0 % (ref 0.0–0.2)

## 2021-08-06 LAB — BASIC METABOLIC PANEL
Anion gap: 7 (ref 5–15)
BUN: 24 mg/dL — ABNORMAL HIGH (ref 6–20)
CO2: 30 mmol/L (ref 22–32)
Calcium: 8.5 mg/dL — ABNORMAL LOW (ref 8.9–10.3)
Chloride: 107 mmol/L (ref 98–111)
Creatinine, Ser: 1.24 mg/dL — ABNORMAL HIGH (ref 0.44–1.00)
GFR, Estimated: 54 mL/min — ABNORMAL LOW (ref >60)
Glucose, Bld: 133 mg/dL — ABNORMAL HIGH (ref 70–99)
Potassium: 3.5 mmol/L (ref 3.5–5.1)
Sodium: 144 mmol/L (ref 135–145)

## 2021-08-06 LAB — GLUCOSE, CAPILLARY
Glucose-Capillary: 102 mg/dL — ABNORMAL HIGH (ref 70–99)
Glucose-Capillary: 102 mg/dL — ABNORMAL HIGH (ref 70–99)
Glucose-Capillary: 108 mg/dL — ABNORMAL HIGH (ref 70–99)
Glucose-Capillary: 110 mg/dL — ABNORMAL HIGH (ref 70–99)
Glucose-Capillary: 132 mg/dL — ABNORMAL HIGH (ref 70–99)
Glucose-Capillary: 135 mg/dL — ABNORMAL HIGH (ref 70–99)
Glucose-Capillary: 49 mg/dL — ABNORMAL LOW (ref 70–99)
Glucose-Capillary: 95 mg/dL (ref 70–99)

## 2021-08-06 LAB — MAGNESIUM: Magnesium: 1.9 mg/dL (ref 1.7–2.4)

## 2021-08-06 LAB — TRIGLYCERIDES: Triglycerides: 106 mg/dL (ref <150)

## 2021-08-06 LAB — PHOSPHORUS: Phosphorus: 4.1 mg/dL (ref 2.5–4.6)

## 2021-08-06 MED ORDER — FUROSEMIDE 10 MG/ML IJ SOLN
40.0000 mg | Freq: Two times a day (BID) | INTRAMUSCULAR | Status: DC
  Administered 2021-08-06 – 2021-08-07 (×2): 40 mg via INTRAVENOUS
  Filled 2021-08-06 (×2): qty 4

## 2021-08-06 MED ORDER — POTASSIUM CHLORIDE 20 MEQ PO PACK
20.0000 meq | PACK | Freq: Once | ORAL | Status: AC
  Administered 2021-08-06: 20 meq
  Filled 2021-08-06: qty 1

## 2021-08-06 MED ORDER — LIP MEDEX EX OINT
TOPICAL_OINTMENT | CUTANEOUS | Status: DC | PRN
  Filled 2021-08-06: qty 7

## 2021-08-06 MED ORDER — GABAPENTIN 100 MG PO CAPS
200.0000 mg | ORAL_CAPSULE | Freq: Three times a day (TID) | ORAL | Status: DC
Start: 2021-08-06 — End: 2021-08-13
  Administered 2021-08-06 – 2021-08-13 (×22): 200 mg via ORAL
  Filled 2021-08-06 (×22): qty 2

## 2021-08-06 MED ORDER — CHLORHEXIDINE GLUCONATE 0.12 % MT SOLN
15.0000 mL | Freq: Two times a day (BID) | OROMUCOSAL | Status: DC
  Administered 2021-08-06 – 2021-08-10 (×9): 15 mL via OROMUCOSAL
  Filled 2021-08-06 (×9): qty 15

## 2021-08-06 MED ORDER — TIZANIDINE HCL 2 MG PO TABS
2.0000 mg | ORAL_TABLET | Freq: Two times a day (BID) | ORAL | Status: DC
  Administered 2021-08-06 – 2021-08-13 (×14): 2 mg via ORAL
  Filled 2021-08-06 (×15): qty 1

## 2021-08-06 MED ORDER — PANTOPRAZOLE SODIUM 40 MG PO TBEC
40.0000 mg | DELAYED_RELEASE_TABLET | Freq: Every day | ORAL | Status: DC
  Administered 2021-08-07 – 2021-08-13 (×7): 40 mg via ORAL
  Filled 2021-08-06 (×7): qty 1

## 2021-08-06 MED ORDER — POTASSIUM CHLORIDE 10 MEQ/100ML IV SOLN
10.0000 meq | INTRAVENOUS | Status: AC
  Administered 2021-08-06 (×3): 10 meq via INTRAVENOUS
  Filled 2021-08-06 (×3): qty 100

## 2021-08-06 MED ORDER — POLYETHYLENE GLYCOL 3350 17 G PO PACK: 17.0000 g | PACK | Freq: Every day | ORAL | Status: DC | PRN

## 2021-08-06 MED ORDER — DOCUSATE SODIUM 100 MG PO CAPS: 100.0000 mg | ORAL_CAPSULE | Freq: Two times a day (BID) | ORAL | Status: DC | PRN

## 2021-08-06 MED ORDER — CHLORHEXIDINE GLUCONATE CLOTH 2 % EX PADS
6.0000 | MEDICATED_PAD | Freq: Every day | CUTANEOUS | Status: DC
  Administered 2021-08-06 – 2021-08-07 (×2): 6 via TOPICAL

## 2021-08-06 MED ORDER — ORAL CARE MOUTH RINSE
15.0000 mL | Freq: Two times a day (BID) | OROMUCOSAL | Status: DC
  Administered 2021-08-06 – 2021-08-09 (×7): 15 mL via OROMUCOSAL

## 2021-08-06 MED ORDER — QUETIAPINE FUMARATE 50 MG PO TABS
100.0000 mg | ORAL_TABLET | Freq: Every day | ORAL | Status: DC
Start: 2021-08-06 — End: 2021-08-08
  Administered 2021-08-06 – 2021-08-07 (×2): 100 mg via ORAL
  Filled 2021-08-06: qty 2
  Filled 2021-08-06: qty 1

## 2021-08-06 NOTE — Progress Notes (Signed)
? ?  NAME:  Audrey [name], MRN:  [id number], DOB:  01/28/1976, LOS: 4 ?ADMISSION DATE:  08/02/2021, CONSULTATION DATE:  08/02/2021 ?REFERRING MD:  Dr. Hyacinth Meeker , CHIEF COMPLAINT:  Cardiac arrest  ? ?History of Present Illness:  ?Audrey [name] is a 46 year old female with a past medical history significant for Pseudotumor cerebri, anxiety, depression, PTSD,, schizoaffective disorder, and chronic opioid use who presented to the ED after suffering outside hospital cardiac arrest.  Per EMS patient was last seen normal per spouse at 4 AM this morning.  She was later found by spouse this afternoon unresponsive prompting EMS call.  Of note patient large volume of hydromorphone tablets (27 tabs missing in 3 dys).  Prolonged arrest in field with 3 arrest on arrival to ED. ? ?Majority of ED work-up pending at time of critical care evaluation.  All lab work and imaging currently pending. ? ?Pertinent  Medical History  ?Pseudotumor cerebri, anxiety, depression, PTSD,, schizoaffective disorder, and chronic opioid use ? ?Significant Hospital Events: ?Including procedures, antibiotic start and stop dates in addition to other pertinent events   ?5/6 presented after out of hospital cardiac arrest ?5/8 extubation trial ?5/9 reintubated ? ?Interim History / Subjective:  ?Awake, diuresed net neg 7L yesterday. ? ?Objective   ?Blood pressure 117/76, pulse 74, temperature 98.6 ?F (37 ?C), resp. rate (!) 23, height 5\' 4"  (1.626 m), weight 135.3 kg, SpO2 97 %. ?CVP:  [7 mmHg-33 mmHg] 12 mmHg  ?Vent Mode: PRVC ?FiO2 (%):  [40 %] 40 % ?Set Rate:  [20 bmp-23 bmp] 23 bmp ?Vt Set:  [430 mL-500 mL] 430 mL ?PEEP:  [5 cmH20] 5 cmH20 ?Plateau Pressure:  [19 cmH20-22 cmH20] 22 cmH20  ? ?Intake/Output Summary (Last 24 hours) at 08/06/2021 10/06/2021 ?Last data filed at 08/06/2021 0745 ?Gross per 24 hour  ?Intake 2973.1 ml  ?Output 10/06/2021 ml  ?Net -7301.9 ml  ? ? ?Filed Weights  ? 08/04/21 0400 08/05/21 0433 08/06/21 0400  ?Weight: (!) 141 kg (!) 141.9 kg  135.3 kg  ? ? ?Examination: ?No distress ?Pale ?Lungs with diminished sounds bases, otherwise clear ?Moves all 4 ext to command ?RASS 0 ? ?K low being repleted ?Cr improved ? ?Resolved Hospital Problem list   ? ? ?Assessment & Plan:  ?Out of hospital cardiac arrest  ?At risk for anoxic encephalopathy ?Aspiration pneumonitis, acute hypoxemic respiratory failure- recurrent 5/9 suspicion for volume overloaded state of heart much improved ?Fall with right face lac with concern for head injury- CT neck/head benign ?Hx of Pseudotumor cerebri ?History of Schizoaffective disorder  ?AKI- ATN now in diuresis phase ? ?- Will consider extubation to BIPAP today ?- Lasix to 40mg  BID, replete K ?- Ceftriaxone x 5 days ?- Husband updated at bedside, requests psychiatry consult when patient off vent ? ?Best Practice (right click and "Reselect all SmartList Selections" daily)  ? ?Diet/type: TF ?DVT prophylaxis: prophylactic heparin  ?GI prophylaxis: PPI ?Lines: yes, tough access ?Foley:  Yes, and it is still needed ?Code Status:  full code ?Last date of multidisciplinary goals of care discussion: IPAL note 7/9 5/6 ? ?33 min cc time ?Pia Mau MD PCCM ? ? ? ? ?

## 2021-08-06 NOTE — Progress Notes (Signed)
eLink Physician-Brief Progress Note ?Patient Name: Audrey [name] ?DOB: 1976/01/12 ?MRN: [id number] ? ? ?Date of Service ? 08/06/2021  ?HPI/Events of Note ? K+ 3.5  ?eICU Interventions ? KCL 10 meq iv Q 1 hour x 3 ordered.  ? ? ? ?  ? ?Migdalia Dk ?08/06/2021, 5:55 AM ?

## 2021-08-07 ENCOUNTER — Encounter (HOSPITAL_COMMUNITY): Payer: Self-pay | Admitting: Internal Medicine

## 2021-08-07 ENCOUNTER — Other Ambulatory Visit (HOSPITAL_COMMUNITY): Payer: BC Managed Care – PPO

## 2021-08-07 DIAGNOSIS — I5041 Acute combined systolic (congestive) and diastolic (congestive) heart failure: Secondary | ICD-10-CM | POA: Diagnosis not present

## 2021-08-07 DIAGNOSIS — I429 Cardiomyopathy, unspecified: Secondary | ICD-10-CM | POA: Diagnosis not present

## 2021-08-07 DIAGNOSIS — I469 Cardiac arrest, cause unspecified: Secondary | ICD-10-CM | POA: Diagnosis not present

## 2021-08-07 LAB — BASIC METABOLIC PANEL
Anion gap: 11 (ref 5–15)
BUN: 20 mg/dL (ref 6–20)
CO2: 28 mmol/L (ref 22–32)
Calcium: 8.6 mg/dL — ABNORMAL LOW (ref 8.9–10.3)
Chloride: 104 mmol/L (ref 98–111)
Creatinine, Ser: 1.03 mg/dL — ABNORMAL HIGH (ref 0.44–1.00)
GFR, Estimated: >60 mL/min (ref >60)
Glucose, Bld: 83 mg/dL (ref 70–99)
Potassium: 3.3 mmol/L — ABNORMAL LOW (ref 3.5–5.1)
Sodium: 143 mmol/L (ref 135–145)

## 2021-08-07 LAB — CBC
HCT: 35.3 % — ABNORMAL LOW (ref 36.0–46.0)
Hemoglobin: 11.6 g/dL — ABNORMAL LOW (ref 12.0–15.0)
MCH: 31.2 pg (ref 26.0–34.0)
MCHC: 32.9 g/dL (ref 30.0–36.0)
MCV: 94.9 fL (ref 80.0–100.0)
Platelets: 171 10*3/uL (ref 150–400)
RBC: 3.72 MIL/uL — ABNORMAL LOW (ref 3.87–5.11)
RDW: 15.8 % — ABNORMAL HIGH (ref 11.5–15.5)
WBC: 7.9 10*3/uL (ref 4.0–10.5)
nRBC: 0 % (ref 0.0–0.2)

## 2021-08-07 LAB — PHOSPHORUS: Phosphorus: 3.3 mg/dL (ref 2.5–4.6)

## 2021-08-07 LAB — MAGNESIUM: Magnesium: 1.7 mg/dL (ref 1.7–2.4)

## 2021-08-07 LAB — GLUCOSE, CAPILLARY: Glucose-Capillary: 98 mg/dL (ref 70–99)

## 2021-08-07 MED ORDER — CLONAZEPAM 0.5 MG PO TABS
1.0000 mg | ORAL_TABLET | Freq: Two times a day (BID) | ORAL | Status: DC
  Administered 2021-08-07 – 2021-08-13 (×12): 1 mg via ORAL
  Filled 2021-08-07 (×13): qty 2

## 2021-08-07 MED ORDER — POTASSIUM CHLORIDE CRYS ER 20 MEQ PO TBCR
20.0000 meq | EXTENDED_RELEASE_TABLET | ORAL | Status: AC
  Administered 2021-08-07 (×2): 20 meq via ORAL
  Filled 2021-08-07 (×2): qty 1

## 2021-08-07 MED ORDER — POTASSIUM CHLORIDE 10 MEQ/50ML IV SOLN
10.0000 meq | INTRAVENOUS | Status: AC
  Administered 2021-08-07 (×4): 10 meq via INTRAVENOUS
  Filled 2021-08-07 (×4): qty 50

## 2021-08-07 MED ORDER — ACETAMINOPHEN 325 MG PO TABS
650.0000 mg | ORAL_TABLET | ORAL | Status: DC | PRN
  Administered 2021-08-07: 650 mg via ORAL
  Filled 2021-08-07: qty 2

## 2021-08-07 MED ORDER — POTASSIUM CHLORIDE CRYS ER 20 MEQ PO TBCR
20.0000 meq | EXTENDED_RELEASE_TABLET | Freq: Two times a day (BID) | ORAL | Status: DC
  Administered 2021-08-07 – 2021-08-13 (×12): 20 meq via ORAL
  Filled 2021-08-07 (×12): qty 1

## 2021-08-07 MED ORDER — ACETAMINOPHEN 160 MG/5ML PO SOLN: 650.0000 mg | ORAL | Status: DC | PRN

## 2021-08-07 MED ORDER — LOSARTAN POTASSIUM 25 MG PO TABS
25.0000 mg | ORAL_TABLET | Freq: Every day | ORAL | Status: DC
  Administered 2021-08-07 – 2021-08-13 (×7): 25 mg via ORAL
  Filled 2021-08-07 (×7): qty 1

## 2021-08-07 MED ORDER — FUROSEMIDE 40 MG PO TABS
40.0000 mg | ORAL_TABLET | Freq: Two times a day (BID) | ORAL | Status: DC
  Administered 2021-08-07 – 2021-08-12 (×10): 40 mg via ORAL
  Filled 2021-08-07 (×10): qty 1

## 2021-08-07 MED ORDER — DULOXETINE HCL 60 MG PO CPEP
60.0000 mg | ORAL_CAPSULE | Freq: Every day | ORAL | Status: DC
  Administered 2021-08-07 – 2021-08-13 (×7): 60 mg via ORAL
  Filled 2021-08-07 (×7): qty 1

## 2021-08-07 MED ORDER — ACETAMINOPHEN 650 MG RE SUPP: 650.0000 mg | RECTAL | Status: DC | PRN

## 2021-08-07 MED ORDER — MIRTAZAPINE 15 MG PO TABS
15.0000 mg | ORAL_TABLET | Freq: Every day | ORAL | Status: DC
  Administered 2021-08-07 – 2021-08-12 (×6): 15 mg via ORAL
  Filled 2021-08-07 (×7): qty 1

## 2021-08-07 MED ORDER — HYDROMORPHONE HCL 2 MG PO TABS
1.0000 mg | ORAL_TABLET | ORAL | Status: DC | PRN
  Administered 2021-08-07 – 2021-08-13 (×16): 1 mg via ORAL
  Filled 2021-08-07 (×18): qty 1

## 2021-08-07 MED ORDER — MAGNESIUM SULFATE 2 GM/50ML IV SOLN
2.0000 g | Freq: Once | INTRAVENOUS | Status: AC
  Administered 2021-08-07: 2 g via INTRAVENOUS
  Filled 2021-08-07: qty 50

## 2021-08-07 NOTE — Evaluation (Signed)
Occupational Therapy Evaluation ?Patient Details ?Name: Audrey [name] ?MRN: [id number] ?DOB: 1976/02/07 ?Today's Date: 08/07/2021 ? ? ?History of Present Illness Pt is a 46 y.o. female who presented 08/02/21 with outside hospital cardiac arrest. CPR performed for ~30 min. Per chart, pt with multiple missing home opiate meds, suspicious for possible unintentional vs intentional overdose as cause of cardiac arrest. ETT 5/6 - 5/8 then re-intubated 5/9 and re-extubated 5/10. Pt also with aspiration pneuomonitis. CT of head and neck unremarkable. PMH: anxiety, arthritis, depression, heart murmur, pseudotumor cerebri, PTSD, schizo affective schizophrenia, sciatica  ? ?Clinical Impression ?  ?Patient admitted for the diagnosis above.  PTA she lives at home with her spouse, who is able to provide the needed assist.  PTA the patient's spouse did assist with IADL completion, but she did complete her own bathing and dressing, managed her medications, and did not need assist with limited mobility and transfers.  Deficits impacting independence are listed below.  OT will continue efforts in the acute setting, with AIR being recommended for aggressive post acute rehab prior to returning home.    ?   ? ?Recommendations for follow up therapy are one component of a multi-disciplinary discharge planning process, led by the attending physician.  Recommendations may be updated based on patient status, additional functional criteria and insurance authorization.  ? ?Follow Up Recommendations ? Acute inpatient rehab (3hours/day)  ?  ?Assistance Recommended at Discharge Frequent or constant Supervision/Assistance  ?Patient can return home with the following Assistance with cooking/housework;Assist for transportation;A lot of help with bathing/dressing/bathroom;A lot of help with walking and/or transfers ? ?  ?Functional Status Assessment ? Patient has had a recent decline in their functional status and demonstrates the ability to make  significant improvements in function in a reasonable and predictable amount of time.  ?Equipment Recommendations ? None recommended by OT  ?  ?Recommendations for Other Services   ? ? ?  ?Precautions / Restrictions Precautions ?Precautions: Fall ?Precaution Comments: watch SpO2 & HR; 1 on 1 sitter, suicide risk ?Restrictions ?Weight Bearing Restrictions: No  ? ?  ? ?Mobility Bed Mobility ?Overal bed mobility: Needs Assistance ?  ?  ?  ?  ?  ?  ?General bed mobility comments: up in recliner ?Patient Response: Cooperative ? ?Transfers ?Overall transfer level: Needs assistance ?Equipment used: Rolling walker (2 wheels) ?Transfers: Sit to/from Stand, Bed to chair/wheelchair/BSC ?Sit to Stand: Min assist ?  ?  ?Step pivot transfers: Min assist, Mod assist ?  ?  ?  ?  ? ?  ?Balance Overall balance assessment: Needs assistance ?Sitting-balance support: Bilateral upper extremity supported, Feet supported ?Sitting balance-Leahy Scale: Fair ?  ?  ?Standing balance support: Bilateral upper extremity supported, During functional activity, Reliant on assistive device for balance ?Standing balance-Leahy Scale: Poor ?  ?  ?  ?  ?  ?  ?  ?  ?  ?  ?  ?  ?   ? ?ADL either performed or assessed with clinical judgement  ? ?ADL Overall ADL's : Needs assistance/impaired ?Eating/Feeding: Set up;Sitting ?  ?Grooming: Wash/dry hands;Wash/dry face;Set up;Sitting ?  ?Upper Body Bathing: Moderate assistance;Sitting ?  ?Lower Body Bathing: Maximal assistance;Sit to/from stand ?  ?Upper Body Dressing : Moderate assistance;Sitting ?  ?Lower Body Dressing: Moderate assistance;Sit to/from stand ?  ?Toilet Transfer: Minimal assistance;BSC/3in1;Stand-pivot ?  ?  ?  ?  ?  ?  ?   ? ? ? ?Vision Patient Visual Report: No change from baseline ?   ?   ?  Perception Perception ?Perception: Within Functional Limits ?  ?Praxis Praxis ?Praxis: Intact ?  ? ?Pertinent Vitals/Pain Pain Assessment ?Pain Assessment: Faces ?Faces Pain Scale: Hurts little more ?Pain  Location: mid-back (chronic per pt) ?Pain Descriptors / Indicators: Discomfort ?Pain Intervention(s): Monitored during session  ? ? ? ?Hand Dominance Right ?  ?Extremity/Trunk Assessment Upper Extremity Assessment ?Upper Extremity Assessment: Generalized weakness ?  ?Lower Extremity Assessment ?Lower Extremity Assessment: Defer to PT evaluation ?RLE Deficits / Details: MMT scores of 4+ knee extension, 4 hip flexion; denies numbness/tingling bil ?LLE Deficits / Details: MMT scores of 4+ knee extension, 4 hip flexion; denies numbness/tingling bil ?  ?Cervical / Trunk Assessment ?Cervical / Trunk Assessment: Other exceptions ?Cervical / Trunk Exceptions: increased body habitus, hx of chronic mid-back pain ?  ?Communication Communication ?Communication: No difficulties ?  ?Cognition Arousal/Alertness: Awake/alert ?Behavior During Therapy: Flat affect ?Overall Cognitive Status: Impaired/Different from baseline ?Area of Impairment: Problem solving ?  ?  ?  ?  ?  ?  ?  ?  ?  ?Current Attention Level: Sustained ?  ?  ?Safety/Judgement: Decreased awareness of deficits, Decreased awareness of safety ?Awareness: Emergent ?Problem Solving: Slow processing ?  ?  ?  ?General Comments  Pt reporting dizziness with mobility but BP and SPO2 stable on 3L O2. HR up to 121 with transfer. ? ?  ?Exercises   ?  ?Shoulder Instructions    ? ? ?Home Living Family/patient expects to be discharged to:: Private residence ?Living Arrangements: Spouse/significant other ?Available Help at Discharge: Family ?Type of Home: Apartment ?Home Access: Level entry ?  ?  ?Home Layout: One level ?  ?  ?Bathroom Shower/Tub: Walk-in shower ?  ?Bathroom Toilet: Standard ?Bathroom Accessibility: Yes ?How Accessible: Accessible via walker ?Home Equipment: Agricultural consultantolling Walker (2 wheels);Shower seat;Grab bars - toilet;Grab bars - tub/shower;Wheelchair - manual;Cane - single point ?  ?  ?  ? ?  ?Prior Functioning/Environment Prior Level of Function : Needs assist ?  ?  ?   ?Physical Assist : Mobility (physical);ADLs (physical) ?Mobility (physical): Gait ?ADLs (physical): IADLs ?Mobility Comments: Pt reports she has not ambulated far distances even in her apartment in "a long time", reportedly only ambulating up to ~5 steps at a time with a RW mod I. When ambulating to/from the car her husband always supervises her. Pt reports she "can't remember" if she has fallen recently. ?ADLs Comments: Reports she can clean and dress herself, but her husband manages the finances and does the cooking, cleaning and driving. ?  ? ?  ?  ?OT Problem List: Decreased strength;Decreased activity tolerance;Impaired balance (sitting and/or standing);Decreased safety awareness;Pain;Obesity ?  ?   ?OT Treatment/Interventions: Self-care/ADL training;Therapeutic exercise;DME and/or AE instruction;Therapeutic activities;Balance training;Patient/family education  ?  ?OT Goals(Current goals can be found in the care plan section) Acute Rehab OT Goals ?Patient Stated Goal: Would like to try rehab to get stronger ?OT Goal Formulation: With patient ?Time For Goal Achievement: 08/21/21 ?Potential to Achieve Goals: Good ?ADL Goals ?Pt Will Perform Grooming: standing;with supervision ?Pt Will Perform Upper Body Dressing: with supervision;sitting ?Pt Will Perform Lower Body Dressing: with min assist;with adaptive equipment;sit to/from stand ?Pt Will Transfer to Toilet: with supervision;ambulating;regular height toilet ?Pt/caregiver will Perform Home Exercise Program: Increased strength;Both right and left upper extremity;With Supervision  ?OT Frequency: Min 2X/week ?  ? ?Co-evaluation   ?  ?  ?  ?  ? ?  ?AM-PAC OT "6 Clicks" Daily Activity     ?Outcome Measure Help from another person  eating meals?: None ?Help from another person taking care of personal grooming?: None ?Help from another person toileting, which includes using toliet, bedpan, or urinal?: A Lot ?Help from another person bathing (including washing, rinsing,  drying)?: A Lot ?Help from another person to put on and taking off regular upper body clothing?: A Lot ?Help from another person to put on and taking off regular lower body clothing?: A Lot ?6 Click Sco

## 2021-08-07 NOTE — Progress Notes (Addendum)
? ?  NAME:  Audrey [name], MRN:  [id number], DOB:  1975-12-02, LOS: 5 ?ADMISSION DATE:  08/02/2021, CONSULTATION DATE:  08/02/2021 ?REFERRING MD:  Dr. Sabra Heck , CHIEF COMPLAINT:  Cardiac arrest  ? ?History of Present Illness:  ?Audrey [name] is a 46 year old female with a past medical history significant for Pseudotumor cerebri, anxiety, depression, PTSD,, schizoaffective disorder, and chronic opioid use who presented to the ED after suffering outside hospital cardiac arrest.  Per EMS patient was last seen normal per spouse at 4 AM this morning.  She was later found by spouse this afternoon unresponsive prompting EMS call.  Of note patient large volume of hydromorphone tablets (27 tabs missing in 3 dys).  Prolonged arrest in field with 3 arrest on arrival to ED. ? ?Majority of ED work-up pending at time of critical care evaluation.  All lab work and imaging currently pending. ? ?Pertinent  Medical History  ?Pseudotumor cerebri, anxiety, depression, PTSD,, schizoaffective disorder, and chronic opioid use ? ?Significant Hospital Events: ?Including procedures, antibiotic start and stop dates in addition to other pertinent events   ?5/6 presented after out of hospital cardiac arrest ?5/8 extubation trial ?5/9 reintubated ?5/10 extubated after diuresis ? ?Interim History / Subjective:  ?Awake, weak. Denies pain. ?Does not remember events leading to arrest. ? ?Objective   ?Blood pressure 120/87, pulse (!) 104, temperature 99.5 ?F (37.5 ?C), resp. rate 11, height 5\' 4"  (1.626 m), weight 132.5 kg, SpO2 96 %. ?CVP:  [3 mmHg-19 mmHg] 19 mmHg  ?Vent Mode: BIPAP ?FiO2 (%):  [40 %] 40 %  ? ?Intake/Output Summary (Last 24 hours) at 08/07/2021 0830 ?Last data filed at 08/07/2021 0400 ?Gross per 24 hour  ?Intake 447.31 ml  ?Output 3275 ml  ?Net -2827.69 ml  ? ? ?Filed Weights  ? 08/05/21 0433 08/06/21 0400 08/07/21 0500  ?Weight: (!) 141.9 kg 135.3 kg 132.5 kg  ? ? ?Examination: ?No distress ?MMM, trachea midline ?Lungs  diminished at bases ?Very weak ?Seems a bit slow to respond and confused.  She denies any SI. ?Ext warm, no edema ? ?Cr improved ?K low being repleted ? ?Resolved Hospital Problem list   ? ? ?Assessment & Plan:  ?Out of hospital cardiac arrest  ?At risk for anoxic encephalopathy ?Aspiration pneumonitis, acute hypoxemic respiratory failure- recurrent 5/9 suspicion for volume overloaded state of heart much improved ?Post arrest cardiomyopathy- EKG nonischemic, global hypokinesis points more toward stress cardiomyopathy ?Fall with right face lac with concern for head injury- CT neck/head benign ?Hx of Pseudotumor cerebri ?History of Schizoaffective disorder  ?AKI- ATN now in diuresis phase ? ?- PT/OT/Psych to see ?- Slowly and cautiously have reintroduced home psych drugs mostly to prevent withdrawal ?- Lasix to 40mg  PO BID, replete K ?- Repeat limited echo, cardiology consult for consideration of ischemic eval and/or GDMT/OP f/u help ?- Ceftriaxone x 5 days to end today ?- Stable for transfer to progressive, appreciate TRH taking over starting AM 5/12 ? ?Best Practice (right click and "Reselect all SmartList Selections" daily)  ? ?Diet/type: TF ?DVT prophylaxis: prophylactic heparin  ?GI prophylaxis: PPI ?Lines: yes, tough access ?Foley:  Yes, and it is still needed ?Code Status:  full code ?Last date of multidisciplinary goals of care discussion: IPAL note Andres Labrum 5/6 ? ?Erskine Emery MD PCCM ? ? ? ? ?

## 2021-08-07 NOTE — Progress Notes (Signed)
In my presence, husband asked her wife if she was in any pain. She answered no.  ?He looked at her and asked again "are you sure you're not in any pain?". She answered "I'm good right now". ?30 seconds later, I hear her announce "I would like some pain medicine." I wasn't observing them and didn't see their previous interaction. ?She was in no obvious pain or distress, appeared calm and comfortable with a  respiratory rate as low as 10 but hovering in the teens. ?The husband was vocal about his wife receiving pain medications although he didn't name any in particular. After reaching out to management and MD, order for IV Fentanyl was changed to PO Dilaudid. ?Upon return to the room, the Pt was fast asleep and the husband was gone. Dilaudid held. ? ?

## 2021-08-07 NOTE — Consult Note (Signed)
?Cardiology Consultation:  ? ?Patient ID: Audrey [name] ?MRN: [id number]; DOB: 1975-05-23 ? ?Admit date: 08/02/2021 ?Date of Consult: 08/07/2021 ? ?PCP:  Nathaneil CanaryPayne, Morgan H, PA-C ?  ?[facility] Providers ?Cardiologist:  None    ? ?Patient Profile:  ? ?Audrey [name] is a 46 y.o. female with a hx of pseudotumor cerebri, anxiety, depression, PTSD, schizoaffective disorder, and chronic opioid use who is being seen 08/07/2021 for the evaluation of cardiac arrest at the request of Dr. Katrinka BlazingSmith. ? ?History of Present Illness:  ? ?Audrey [name] is a 46 year old female with above medical history. Per chart review, patient was seen briefly by a cardiologist in 2016/2017 for treatment of idiopathic pericarditis. Otherwise, patient does not have any cardiac history and does not regularly see a cardiologist.  ? ?Patient presented to the ED via EMS on 5/06 following an out of hospital cardiac arrest. Patient was last seen normal on 5/6 at 4 AM by spouse, was found to be unresponsive and EMS was called. Patient had 3 subsequent cardiac arrests on arrival to ED. Of note, patient had been taking large amounts of her hydromorphone tables (27 tablets missing in 3 days).   ? ?Initial workup in the ED showed creatinine 2.04, albumin 2.8, AST 344, ALT 178, lactic acid 5.7, WBC 13.7, hemoglobin 11.7. hsTn 62>>92. Urine tox screen positive for benzodiazepines and opiates (has prescriptions for both). CT cervical spine negative. CT head negative. EEG suggestive of moderate to severe diffuse encephalopathy, nonspecific etiology. NO seizures or epileptiform discharges.  ? ?Echocardiogram showed EF 30-35%, global hypokinesis, grade I diastolic dysfunction, severely reduced RV systolic function ? ?CXR on 5/8 showed new bibasilar opacitries that could represent atelectasis or PNA.  ? ?Patient was intubated upon presentation to ED, extubated on 5/8. However, ABG showed acute hypercapnic respiratory failure with hypoxia and required  reintubation on 5/11. Extubated to bipap on 5/10 after diuresis.  ? ?Cardiology asked to consult for consideration of ischemic eval, initiation of GDMT. Repeat limited echocardiogram pending.  ? ?On interview, patient denies any cardiac history aside from her episode of pericarditis in 2016. Denies any recent chest pain on exertion or while at rest. Reports some intermittent SOB and occasional palpitations. Does not use tobacco or alcohol. Treated for HTN. Denies any family history of premature CAD.  ?Currently, patient denies chest pain. Has some SOB, but is able to tolerate laying flat on her back. Denies swelling in hands or feet.  Wearing nasal cannula with 3L supplemental oxygen.  ? ?Past Medical History:  ?Diagnosis Date  ? Anxiety   ? Arthritis   ? Depression   ? Heart murmur   ? Pseudotumor cerebri   ? PTSD (post-traumatic stress disorder)   ? Schizo affective schizophrenia (HCC)   ? Sciatica   ? ? ?Past Surgical History:  ?Procedure Laterality Date  ? ABDOMINAL HYSTERECTOMY    ? ABDOMINAL SURGERY    ? APPENDECTOMY    ? CARDIAC CATHETERIZATION    ? CHOLECYSTECTOMY    ? KNEE SURGERY    ? TONSILLECTOMY    ? TUBAL LIGATION    ?  ? ?Home Medications:  ?Prior to Admission medications   ?Medication Sig Start Date End Date Taking? Authorizing Provider  ?albuterol (VENTOLIN HFA) 108 (90 Base) MCG/ACT inhaler Inhale 2 puffs into the lungs every 6 (six) hours as needed for wheezing or shortness of breath.   Yes [provider]  ?clonazePAM (KLONOPIN) 1 MG tablet Take 1 mg by mouth 3 (three)  times daily.   Yes [provider]  ?DULoxetine (CYMBALTA) 60 MG capsule Take 60 mg by mouth in the morning, at noon, and at bedtime.   Yes [provider]  ?gabapentin (NEURONTIN) 800 MG tablet Take 800 mg by mouth 3 (three) times daily.   Yes [provider]  ?HYDROmorphone (DILAUDID) 2 MG tablet Take 2 mg by mouth 3 (three) times daily. 05/06/21  Yes [provider]  ?losartan (COZAAR)  50 MG tablet Take 50 mg by mouth daily.   Yes [provider]  ?mirtazapine (REMERON SOL-TAB) 15 MG disintegrating tablet Take 15-30 mg by mouth at bedtime.   Yes [provider]  ?naloxone (NARCAN) nasal spray 4 mg/0.1 mL Place 1 spray into the nose once as needed (for accidental overdose). 02/26/21  Yes [provider]  ?QUEtiapine (SEROQUEL) 100 MG tablet Take 100 mg by mouth 3 (three) times daily.   Yes [provider]  ?tiZANidine (ZANAFLEX) 4 MG tablet Take 1 tablet (4 mg total) by mouth every 12 (twelve) hours as needed for muscle spasms. ?Patient taking differently: Take 4 mg by mouth 3 (three) times daily. 11/30/20  Yes McDonald, Mia A, PA-C  ?verapamil (VERELAN PM) 180 MG 24 hr capsule Take 180 mg by mouth daily.   Yes [provider]  ? ? ?Inpatient Medications: ?Scheduled Meds: ? chlorhexidine  15 mL Mouth Rinse BID  ? Chlorhexidine Gluconate Cloth  6 each Topical Daily  ? furosemide  40 mg Oral BID  ? gabapentin  200 mg Oral TID  ? heparin  5,000 Units Subcutaneous Q8H  ? mouth rinse  15 mL Mouth Rinse q12n4p  ? pantoprazole  40 mg Oral Daily  ? potassium chloride  20 mEq Oral BID  ? QUEtiapine  100 mg Oral QHS  ? tiZANidine  2 mg Oral BID  ? ?Continuous Infusions: ? sodium chloride Stopped (08/03/21 0947)  ? sodium chloride Stopped (08/06/21 0917)  ? sodium chloride Stopped (08/06/21 0042)  ? dexmedetomidine (PRECEDEX) IV infusion Stopped (08/06/21 9678)  ? fentaNYL infusion INTRAVENOUS Stopped (08/06/21 0743)  ? potassium chloride 10 mEq (08/07/21 1000)  ? ?PRN Meds: ?Place/Maintain arterial line **AND** sodium chloride, acetaminophen **OR** acetaminophen (TYLENOL) oral liquid 160 mg/5 mL **OR** acetaminophen, albuterol, docusate sodium, fentaNYL (SUBLIMAZE) injection, lip balm, naloxone, ondansetron (ZOFRAN) IV, polyethylene glycol ? ?Allergies:    ?Allergies  ?Allergen Reactions  ? Penicillins Anaphylaxis and Rash  ? Prochlorperazine Anaphylaxis and Nausea  And Vomiting  ? Acetazolamide Other (See Comments)  ?  Joint pain  ? Latex Hives and Swelling  ? Propoxyphene Nausea And Vomiting  ? Nitroglycerin Er Swelling and Other (See Comments)  ?  Body swells, but no breathing issues  ? ? ?Social History:   ?Social History  ? ?Socioeconomic History  ? Marital status: Married  ?  Spouse name: Not on file  ? Number of children: Not on file  ? Years of education: Not on file  ? Highest education level: Not on file  ?Occupational History  ? Not on file  ?Tobacco Use  ? Smoking status: Never  ? Smokeless tobacco: Never  ?Vaping Use  ? Vaping Use: Never used  ?Substance and Sexual Activity  ? Alcohol use: Never  ? Drug use: Never  ? Sexual activity: Not on file  ?Other Topics Concern  ? Not on file  ?Social History Narrative  ? Not on file  ? ?Social Determinants of Health  ? ?Financial Resource Strain: Not on file  ?  Food Insecurity: Not on file  ?Transportation Needs: Not on file  ?Physical Activity: Not on file  ?Stress: Not on file  ?Social Connections: Not on file  ?Intimate Partner Violence: Not on file  ?  ?Family History:   ?No family history on file.  ? ?ROS:  ?Please see the history of present illness.  ? ?All other ROS reviewed and negative.    ? ?Physical Exam/Data:  ? ?Vitals:  ? 08/07/21 0700 08/07/21 0741 08/07/21 0800 08/07/21 0900  ?BP: 112/77 120/87  (!) 85/42  ?Pulse: (!) 106  (!) 113 (!) 116  ?Resp: (!) 9  (!) 22 18  ?Temp: 99.3 ?F (37.4 ?C)  99 ?F (37.2 ?C) 98.8 ?F (37.1 ?C)  ?TempSrc:      ?SpO2: 96%  95% 92%  ?Weight:      ?Height:      ? ? ?Intake/Output Summary (Last 24 hours) at 08/07/2021 1021 ?Last data filed at 08/07/2021 1000 ?Gross per 24 hour  ?Intake 513.52 ml  ?Output 3375 ml  ?Net -2861.48 ml  ? ? ?  08/07/2021  ?  5:00 AM 08/06/2021  ?  4:00 AM 08/05/2021  ?  4:33 AM  ?Last 3 Weights  ?Weight (lbs) 292 lb 1.8 oz 298 lb 4.5 oz 312 lb 13.3 oz  ?Weight (kg) 132.5 kg 135.3 kg 141.9 kg  ?   ?Body mass index is 50.14 kg/m?.  ?General:  Middle aged female in  no acute distress, laying flat on the bed ?HEENT: normal ?Neck: no JVD ?Vascular: Radial pulses 2+ bilaterally ?Cardiac:  normal S1, S2; RRR; no murmur  ?Lungs:  clear to auscultation bilaterally, diminished

## 2021-08-07 NOTE — Evaluation (Signed)
Physical Therapy Evaluation ?Patient Details ?Name: Audrey [name] ?MRN: [id number] ?DOB: 09-Jul-1975 ?Today's Date: 08/07/2021 ? ?History of Present Illness ? Pt is a 46 y.o. female who presented 08/02/21 with outside hospital cardiac arrest. CPR performed for ~30 min. Per chart, pt with multiple missing home opiate meds, suspicious for possible unintentional vs intentional overdose as cause of cardiac arrest. ETT 5/6 - 5/8 then re-intubated 5/9 and re-extubated 5/10. Pt also with aspiration pneuomonitis. CT of head and neck unremarkable. PMH: anxiety, arthritis, depression, heart murmur, pseudotumor cerebri, PTSD, schizo affective schizophrenia, sciatica ?  ?Clinical Impression ? Pt presents with condition above and deficits mentioned below, see PT Problem List. PTA, she was living with her husband (who works full-time 3rd shift starting at 2 am) in a 1-level apartment with a level entry. Pt reports she usually only takes ~5 steps at a time mod I with a RW, but requires SUP for the ~10 steps it takes to/from the car. Pt is currently displaying deficits in safety awareness, attention, lower extremity functional strength, balance, and activity tolerance that all place her at high risk for falls and injury. Pt is requiring minA for transfers sit to stand and modA to take a few steps from bed > chair with a RW today. Considering pt's young age, functional decline, and good potential to reach a mod I level of care for short mobility, recommending AIR to maximize her return to baseline prior to return home. If she does not qualify for AIR, she may benefit from short-term rehab at a SNF if she does not progress to a level she does not need physical assistance prior to d/c. Will continue to follow acutely. ?   ? ?Recommendations for follow up therapy are one component of a multi-disciplinary discharge planning process, led by the attending physician.  Recommendations may be updated based on patient status, additional  functional criteria and insurance authorization. ? ?Follow Up Recommendations Acute inpatient rehab (3hours/day) ? ?  ?Assistance Recommended at Discharge Frequent or constant Supervision/Assistance  ?Patient can return home with the following ? A lot of help with walking and/or transfers;A lot of help with bathing/dressing/bathroom;Assistance with cooking/housework;Direct supervision/assist for medications management;Direct supervision/assist for financial management;Assist for transportation ? ?  ?Equipment Recommendations None recommended by PT  ?Recommendations for Other Services ? Rehab consult;OT consult  ?  ?Functional Status Assessment Patient has had a recent decline in their functional status and demonstrates the ability to make significant improvements in function in a reasonable and predictable amount of time.  ? ?  ?Precautions / Restrictions Precautions ?Precautions: Fall ?Precaution Comments: watch SpO2 & HR; 1 on 1 sitter, suicide risk ?Restrictions ?Weight Bearing Restrictions: No  ? ?  ? ?Mobility ? Bed Mobility ?Overal bed mobility: Needs Assistance ?Bed Mobility: Supine to Sit ?  ?  ?Supine to sit: Min guard, HOB elevated ?  ?  ?General bed mobility comments: Extra time to manage legs off bed and ascend trunk, HOB elevated, min guard for safety. Pt then stating "let's get this done", trying to stand prior to even sitting all the way upright, needing cues to gain seated balance first ?  ? ?Transfers ?Overall transfer level: Needs assistance ?Equipment used: Rolling walker (2 wheels) ?Transfers: Sit to/from Stand, Bed to chair/wheelchair/BSC ?Sit to Stand: Min assist ?  ?Step pivot transfers: Mod assist ?  ?  ?  ?General transfer comment: Pt requiring minA to power up to stand from EOB to RW, needing cues for hand placement for safety.  Pt with posterior lean and tendency to try to sit prematurely, needing modA to prevent LOB and maintain pt safety with stand step to R bed > recliner. ?   ? ?Ambulation/Gait ?Ambulation/Gait assistance: Mod assist ?Gait Distance (Feet): 3 Feet ?Assistive device: Rolling walker (2 wheels) ?Gait Pattern/deviations: Step-through pattern, Decreased stride length, Leaning posteriorly ?Gait velocity: reduced ?Gait velocity interpretation: <1.31 ft/sec, indicative of household ambulator ?  ?General Gait Details: Pt taking slow, small steps to R bed > recliner with RW, displaying a posterior bias and tendency to try to sit prematurely, needing modA to prevent LOB and maintain safety. Pt declining to attempt to ambulate further. ? ?Stairs ?  ?  ?  ?  ?  ? ?Wheelchair Mobility ?  ? ?Modified Rankin (Stroke Patients Only) ?Modified Rankin (Stroke Patients Only) ?Pre-Morbid Rankin Score: Moderate disability ?Modified Rankin: Moderately severe disability ? ?  ? ?Balance Overall balance assessment: Needs assistance ?Sitting-balance support: Bilateral upper extremity supported, Feet supported ?Sitting balance-Leahy Scale: Poor ?Sitting balance - Comments: UE support to sit statically EOB with min guard for safety, trunk sway noted ?  ?Standing balance support: Bilateral upper extremity supported, During functional activity, Reliant on assistive device for balance ?Standing balance-Leahy Scale: Poor ?Standing balance comment: Reliant on RW and min-modA ?  ?  ?  ?  ?  ?  ?  ?  ?  ?  ?  ?   ? ? ? ?Pertinent Vitals/Pain Pain Assessment ?Pain Assessment: Faces ?Faces Pain Scale: Hurts little more ?Pain Location: mid-bacj (chronic per pt) ?Pain Descriptors / Indicators: Discomfort ?Pain Intervention(s): Limited activity within patient's tolerance, Monitored during session, Repositioned  ? ? ?Home Living Family/patient expects to be discharged to:: Private residence ?Living Arrangements: Spouse/significant other ?Available Help at Discharge: Family;Available PRN/intermittently (husband works 3rd shift reportedly starting at 2 am per pt) ?Type of Home: Apartment ?Home Access: Level entry ?   ?  ?  ?Home Layout: One level ?Home Equipment: Agricultural consultantolling Walker (2 wheels);Shower seat;Grab bars - toilet;Grab bars - tub/shower;Wheelchair - manual;Cane - single point ?   ?  ?Prior Function Prior Level of Function : Needs assist ?  ?  ?  ?Physical Assist : Mobility (physical);ADLs (physical) ?Mobility (physical): Gait ?ADLs (physical): IADLs ?Mobility Comments: Pt reports she has not ambulated far distances even in her apartment in "a long time", reportedly only ambulating up to ~5 steps at a time with a RW mod I. When ambulating to/from the car her husband always supervises her. Pt reports she "can't remember" if she has fallen recently. ?ADLs Comments: Reports she can clean and dress herself, but her husband manages the finances and does the cooking, cleaning and driving. ?  ? ? ?Hand Dominance  ?   ? ?  ?Extremity/Trunk Assessment  ? Upper Extremity Assessment ?Upper Extremity Assessment: Defer to OT evaluation ?  ? ?Lower Extremity Assessment ?Lower Extremity Assessment: Generalized weakness;RLE deficits/detail;LLE deficits/detail ?RLE Deficits / Details: MMT scores of 4+ knee extension, 4 hip flexion; denies numbness/tingling bil ?LLE Deficits / Details: MMT scores of 4+ knee extension, 4 hip flexion; denies numbness/tingling bil ?  ? ?Cervical / Trunk Assessment ?Cervical / Trunk Assessment: Other exceptions ?Cervical / Trunk Exceptions: increased body habitus, hx of chronic mid-back pain  ?Communication  ? Communication: No difficulties  ?Cognition Arousal/Alertness: Awake/alert ?Behavior During Therapy: Flat affect ?Overall Cognitive Status: Impaired/Different from baseline ?Area of Impairment: Safety/judgement, Awareness, Attention ?  ?  ?  ?  ?  ?  ?  ?  ?  ?  Current Attention Level: Sustained ?  ?  ?Safety/Judgement: Decreased awareness of deficits, Decreased awareness of safety ?Awareness: Emergent ?  ?General Comments: Pt with flat affect. Pt follows commands but requires cues to maintain her safety,  seemingly unaware of her trying to sit prematurely. Pt also stating "let's get this done", trying to stand prior to even sitting all the way upright. ?  ?  ? ?  ?General Comments General comments (skin integrity, edem

## 2021-08-07 NOTE — Progress Notes (Signed)
Inpatient Rehab Admissions Coordinator:  ? ?Per PT/OT recommendations pt was screened for CIR by Estill Dooms, PT, DPT.  At this time pt appears to be a potential candidate for CIR and I will place a consult order per protocol so an [facility] can complete a full assessment.  ? ?Estill Dooms, PT, DPT ?Admissions Coordinator ?[phone] ?08/07/21  ?12:59 PM ? ?

## 2021-08-07 NOTE — Progress Notes (Signed)
Pt has been yellow MEWS.  Pt stable at this time.  Will continue Q4 hour vitals per protocol.  ? ? 08/07/21 2000  ?Assess: MEWS Score  ?Temp 98 ?F (36.7 ?C)  ?BP 139/87  ?Pulse Rate (!) 115  ?ECG Heart Rate (!) 113  ?Resp 15  ?Level of Consciousness Alert  ?SpO2 100 %  ?Assess: MEWS Score  ?MEWS Temp 0  ?MEWS Systolic 0  ?MEWS Pulse 2  ?MEWS RR 0  ?MEWS LOC 0  ?MEWS Score 2  ?MEWS Score Color Yellow  ?Assess: if the MEWS score is Yellow or Red  ?Were vital signs taken at a resting state? Yes  ?Focused Assessment No change from prior assessment  ?Early Detection of Sepsis Score *See Row Information* Low  ?MEWS guidelines implemented *See Row Information* No, previously yellow, continue vital signs every 4 hours  ? ? ?

## 2021-08-07 NOTE — Progress Notes (Signed)
[facility] ADULT ICU REPLACEMENT PROTOCOL ? ? ?The patient does apply for the [facility] Adult ICU Electrolyte Replacment Protocol based on the criteria listed below:  ? ?1.Exclusion criteria: TCTS patients, ECMO patients, and Dialysis patients ?2. Is GFR >/= 30 ml/min? Yes.    ?Patient's GFR today is >60 ?3. Is SCr </= 2? Yes.   ?Patient's SCr is 1.03 mg/dL ?4. Did SCr increase >/= 0.5 in 24 hours? No. ?5.Pt's weight >40kg  Yes.   ?6. Abnormal electrolyte(s): mag 1.7, K+ 3.3  ?7. Electrolytes replaced per protocol ?8.  Call MD STAT for K+ </= 2.5, Phos </= 1, or Mag </= 1 ?Physician:  n/a ? ?Audrey [name] 08/07/2021 6:10 AM ? ?

## 2021-08-07 NOTE — Progress Notes (Signed)
Pt admitted to 6E AxOx4, VS wnL and as per flow. Pt oriented to 6E processes. Pt familiar with the [facility] system. All questions and concerns addressed. Call bell placed within reach, will continue to monitor and maintain safety. Husband and sitter bedside. ?
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
## 2021-08-08 ENCOUNTER — Inpatient Hospital Stay (HOSPITAL_COMMUNITY): Payer: BC Managed Care – PPO

## 2021-08-08 ENCOUNTER — Other Ambulatory Visit: Payer: Self-pay

## 2021-08-08 ENCOUNTER — Encounter (HOSPITAL_COMMUNITY): Payer: Self-pay | Admitting: Internal Medicine

## 2021-08-08 DIAGNOSIS — I469 Cardiac arrest, cause unspecified: Secondary | ICD-10-CM | POA: Diagnosis not present

## 2021-08-08 DIAGNOSIS — I429 Cardiomyopathy, unspecified: Secondary | ICD-10-CM | POA: Diagnosis not present

## 2021-08-08 DIAGNOSIS — R000 Tachycardia, unspecified: Secondary | ICD-10-CM | POA: Diagnosis not present

## 2021-08-08 LAB — CBC
HCT: 41.2 % (ref 36.0–46.0)
Hemoglobin: 13.1 g/dL (ref 12.0–15.0)
MCH: 31 pg (ref 26.0–34.0)
MCHC: 31.8 g/dL (ref 30.0–36.0)
MCV: 97.6 fL (ref 80.0–100.0)
Platelets: 207 10*3/uL (ref 150–400)
RBC: 4.22 MIL/uL (ref 3.87–5.11)
RDW: 15.8 % — ABNORMAL HIGH (ref 11.5–15.5)
WBC: 8.4 10*3/uL (ref 4.0–10.5)
nRBC: 0 % (ref 0.0–0.2)

## 2021-08-08 LAB — BASIC METABOLIC PANEL
Anion gap: 10 (ref 5–15)
BUN: 14 mg/dL (ref 6–20)
CO2: 29 mmol/L (ref 22–32)
Calcium: 8.9 mg/dL (ref 8.9–10.3)
Chloride: 101 mmol/L (ref 98–111)
Creatinine, Ser: 0.85 mg/dL (ref 0.44–1.00)
GFR, Estimated: >60 mL/min (ref >60)
Glucose, Bld: 95 mg/dL (ref 70–99)
Potassium: 4.6 mmol/L (ref 3.5–5.1)
Sodium: 140 mmol/L (ref 135–145)

## 2021-08-08 LAB — CULTURE, BLOOD (ROUTINE X 2): Culture: NO GROWTH

## 2021-08-08 LAB — MAGNESIUM: Magnesium: 2.5 mg/dL — ABNORMAL HIGH (ref 1.7–2.4)

## 2021-08-08 LAB — VITAMIN B12: Vitamin B-12: 157 pg/mL — ABNORMAL LOW (ref 180–914)

## 2021-08-08 LAB — ECHOCARDIOGRAM LIMITED
Height: 64 in
S' Lateral: 3 cm
Weight: 4539.71 oz

## 2021-08-08 LAB — TSH: TSH: 4.636 u[IU]/mL — ABNORMAL HIGH (ref 0.350–4.500)

## 2021-08-08 LAB — PHOSPHORUS: Phosphorus: 4.6 mg/dL (ref 2.5–4.6)

## 2021-08-08 MED ORDER — QUETIAPINE FUMARATE 50 MG PO TABS
200.0000 mg | ORAL_TABLET | Freq: Every day | ORAL | Status: DC
Start: 2021-08-08 — End: 2021-08-13
  Administered 2021-08-08 – 2021-08-12 (×5): 200 mg via ORAL
  Filled 2021-08-08 (×5): qty 4

## 2021-08-08 MED ORDER — PERFLUTREN LIPID MICROSPHERE
1.0000 mL | INTRAVENOUS | Status: AC | PRN
  Administered 2021-08-08: 3 mL via INTRAVENOUS
  Filled 2021-08-08: qty 10

## 2021-08-08 MED ORDER — VITAMIN B-12 1000 MCG PO TABS
1000.0000 ug | ORAL_TABLET | Freq: Every day | ORAL | Status: DC
  Administered 2021-08-08 – 2021-08-09 (×2): 1000 ug via ORAL
  Filled 2021-08-08 (×2): qty 1

## 2021-08-08 NOTE — Progress Notes (Signed)
?Progress Note ? ?Patient: Audrey [name] MVE:[id number] DOB: 1976-01-19  ?DOA: 08/02/2021  DOS: 08/08/2021  ?  ?Brief hospital course: ?Audrey [name] is a 46 year old female with a past medical history significant for Pseudotumor cerebri, anxiety, depression, PTSD,, schizoaffective disorder, and chronic opioid use who presented to the ED after suffering outside hospital cardiac arrest.  Per EMS patient was last seen normal per spouse at 4 AM this morning.  She was later found by spouse this afternoon unresponsive prompting EMS call.  Of note patient large volume of hydromorphone tablets (27 tabs missing in 3 dys).  Prolonged arrest in field with 3 arrest on arrival to ED. ?  ?Majority of ED work-up pending at time of critical care evaluation.  All lab work and imaging currently pending. ? ?Assessment and Plan: ?Out of hospital cardiac arrest, stress cardiomyopathy: Improving by repeat echo 5/12.  ?- Diuresis and GDMT per cardiology ? ?Amnesia, anoxic encephalopathy: Improving ? ?Aspiration pneumonitis, acute hypoxemic respiratory failure: recurrent 5/9 suspicion for volume overloaded has again resolved. Completed 5 days ceftriaxone, cultures negative. ? ?Fall with right face lac with concern for head injury- CT neck/head benign. Healing as anticipated.  ? ?Hx of Pseudotumor cerebri ? ?History of Schizoaffective disorder, suspect suicide attempt with ongoing depression:  ?- Inpatient psychiatric hospitalization recommended at this time. Currently not IVC. Continue Recruitment consultant.  ?- Continue seroquel 200mg  qHS (was on 100mg  TID PTA) ?- Continue remeron 15mg  qHS ?- Continue clonazepam 1mg  BID (was 1mg  TID PTA) ?- Continue cymbalta 60mg  daily  ? ?Vitamin B12 deficiency:  ?- Supplement.  ? ?Elevated TSH: Very mild, in severely ill setting.  ?- Recheck at follow up. ? ?AKI due to ATN: Resolved, had a day of 8L diuresis earlier in the hospitalization. ?   ?Obesity: Estimated body mass index is 48.7 kg/m? as  calculated from the following: ?  Height as of this encounter: 5\' 4"  (1.626 m). ?  Weight as of this encounter: 128.7 kg. ? ?Subjective: Mid-chest pain from CPR with skin irritation where CPR machine was. Still amnestic, remembers being intubated. Working with therapy.  ? ?Objective: ?Vitals:  ? 08/08/21 1205 08/08/21 1310 08/08/21 1332 08/08/21 1603  ?BP: 125/78   114/77  ?Pulse:  (!) 110 100   ?Resp:   20   ?Temp: 98.2 ?F (36.8 ?C)   98 ?F (36.7 ?C)  ?TempSrc: Oral   Oral  ?SpO2: 94%  93%   ?Weight:      ?Height:      ? ?Gen: 46 y.o. female in no distress ?Pulm: Nonlabored breathing room air.  ?CV: Regular tachycardia. No murmur, rub, or gallop. No JVD, no dependent edema. ?GI: Abdomen soft, non-tender, non-distended, with normoactive bowel sounds.  ?Ext: Warm, no deformities ?Skin: Central line site c/d/i without rashes, lesions or ulcers on visualized skin. ?Neuro: Alert and oriented. No focal neurological deficits. ?Psych: Depressed, appropriate behavior ? ?Data Personally reviewed: ?CBC: ?Recent Labs  ?Lab 08/04/21 ? 08/05/21 ?0012 08/05/21 ?10/08/21 08/05/21 ?57 08/05/21 ?1105 08/06/21 ?0352 08/07/21 ?10/05/21 08/08/21 ?1301  ?WBC 10.6*  --  9.7  --   --  7.0 7.9 8.4  ?HGB 11.1*   < > 10.9* 10.9* 10.5* 10.5* 11.6* 13.1  ?HCT 33.5*   < > 34.8* 32.0* 31.0* 32.7* 35.3* 41.2  ?MCV 95.2  --  100.3*  --   --  97.3 94.9 97.6  ?PLT 147*  --  141*  --   --  133* 171 207  ? < > =  values in this interval not displayed.  ? ?Basic Metabolic Panel: ?Recent Labs  ?Lab 08/04/21 ?8889 08/05/21 ?0012 08/05/21 ?1694 08/05/21 ?5038 08/05/21 ?1105 08/06/21 ?0352 08/07/21 ?8828 08/08/21 ?0220  ?NA 138   < > 142 142 142 144 143 140  ?K 2.8*   < > 5.2* 5.1 5.0 3.5 3.3* 4.6  ?CL 109  --  113*  --   --  107 104 101  ?CO2 19*  --  21*  --   --  30 28 29   ?GLUCOSE 116*  --  109*  --   --  133* 83 95  ?BUN 20  --  22*  --   --  24* 20 14  ?CREATININE 2.32*  --  1.83*  --   --  1.24* 1.03* 0.85  ?CALCIUM 8.1*  --  9.0  --   --  8.5* 8.6*  8.9  ?MG 1.7  --  2.1  --   --  1.9 1.7 2.5*  ?PHOS 2.4*  --  3.9  --   --  4.1 3.3 4.6  ? < > = values in this interval not displayed.  ? ?GFR: ?Estimated Creatinine Clearance: 108.9 mL/min (by C-G formula based on SCr of 0.85 mg/dL). ?Liver Function Tests: ?Recent Labs  ?Lab 08/02/21 ?1828  ?AST 344*  ?ALT 178*  ?ALKPHOS 109  ?BILITOT 0.7  ?PROT 5.2*  ?ALBUMIN 2.8*  ? ?Cardiac Enzymes: ?Recent Labs  ?Lab 08/02/21 ?2047  ?CKTOTAL 109  ? ?CBG: ?Recent Labs  ?Lab 08/06/21 ?1158 08/06/21 ?1202 08/06/21 ?1543 08/06/21 ?2044 08/07/21 ?0030  ?GLUCAP 49* 102* 95 102* 98  ? ?Lipid Profile: ?Recent Labs  ?  08/06/21 ?0352  ?TRIG 106  ? ?Thyroid Function Tests: ?Recent Labs  ?  08/08/21 ?1301  ?TSH 4.636*  ? ?Anemia Panel: ?Recent Labs  ?  08/08/21 ?1301  ?VITAMINB12 157*  ? ?ECHOCARDIOGRAM LIMITED 08/08/2021 LV and RV function have significantly improved from prior echo. EF approximately 50-55%. FINDINGS  Left Ventricle: LV and RV function have significantly improved from prior echo. EF approximately 50-55%.   ?  ?10/08/2021, MD ?08/08/2021 6:27 PM ?Page by [email or web address]  ?

## 2021-08-08 NOTE — Progress Notes (Signed)
? ?Progress Note ? ?Patient Name: Audrey [name] ?Date of Encounter: 08/08/2021 ? ?[facility] Cardiologist: Jodelle Red, MD  ? ?Subjective  ? ?Pt reports feeling sore, breathing better with [place].  ? ?Inpatient Medications  ?  ?Scheduled Meds: ? chlorhexidine  15 mL Mouth Rinse BID  ? Chlorhexidine Gluconate Cloth  6 each Topical Daily  ? clonazePAM  1 mg Oral BID  ? DULoxetine  60 mg Oral Daily  ? furosemide  40 mg Oral BID  ? gabapentin  200 mg Oral TID  ? heparin  5,000 Units Subcutaneous Q8H  ? losartan  25 mg Oral Daily  ? mouth rinse  15 mL Mouth Rinse q12n4p  ? mirtazapine  15 mg Oral QHS  ? pantoprazole  40 mg Oral Daily  ? potassium chloride  20 mEq Oral BID  ? QUEtiapine  100 mg Oral QHS  ? tiZANidine  2 mg Oral BID  ? ?Continuous Infusions: ? ?PRN Meds: ?acetaminophen **OR** acetaminophen (TYLENOL) oral liquid 160 mg/5 mL **OR** acetaminophen, albuterol, docusate sodium, HYDROmorphone, lip balm, naloxone, ondansetron (ZOFRAN) IV, polyethylene glycol  ? ?Vital Signs  ?  ?Vitals:  ? 08/08/21 0044 08/08/21 0450 08/08/21 0518 08/08/21 0700  ?BP: (!) 114/51  125/87 (!) 137/94  ?Pulse: 99  99 97  ?Resp: 13  14   ?Temp: 98.2 ?F (36.8 ?C)  98.3 ?F (36.8 ?C) 98 ?F (36.7 ?C)  ?TempSrc: Oral  Oral Oral  ?SpO2: 98%  96%   ?Weight:  128.7 kg    ?Height:      ? ? ?Intake/Output Summary (Last 24 hours) at 08/08/2021 1015 ?Last data filed at 08/08/2021 0743 ?Gross per 24 hour  ?Intake 296.44 ml  ?Output 400 ml  ?Net -103.56 ml  ? ? ?  08/08/2021  ?  4:50 AM 08/07/2021  ?  5:00 AM 08/06/2021  ?  4:00 AM  ?Last 3 Weights  ?Weight (lbs) 283 lb 11.7 oz 292 lb 1.8 oz 298 lb 4.5 oz  ?Weight (kg) 128.7 kg 132.5 kg 135.3 kg  ?   ? ?Telemetry  ?  ?Sinus tachycardia in the 100s - Personally Reviewed ? ?ECG  ?  ?No new tracings - Personally Reviewed ? ?Physical Exam  ? ?GEN: No acute distress.   ?Neck: No JVD ?Cardiac: regular rhythm, tachycardic rate  ?Respiratory: diminished in bases ?GI: Soft, nontender,  non-distended  ?MS: No edema; No deformity. ?Neuro:  Nonfocal  ?Psych: Normal affect  ? ?Labs  ?  ?High Sensitivity Troponin:   ?Recent Labs  ?Lab 08/02/21 ?1828 08/02/21 ?2047  ?TROPONINIHS 62* 92*  ?   ?Chemistry ?Recent Labs  ?Lab 08/02/21 ?1828 08/03/21 ?0019 08/06/21 ?5701 08/07/21 ?7793 08/08/21 ?0220  ?NA 144   < > 144 143 140  ?K 4.0   < > 3.5 3.3* 4.6  ?CL 111   < > 107 104 101  ?CO2 16*   < > 30 28 29   ?GLUCOSE 154*   < > 133* 83 95  ?BUN 15   < > 24* 20 14  ?CREATININE 2.04*   < > 1.24* 1.03* 0.85  ?CALCIUM 7.5*   < > 8.5* 8.6* 8.9  ?MG 2.2   < > 1.9 1.7 2.5*  ?PROT 5.2*  --   --   --   --   ?ALBUMIN 2.8*  --   --   --   --   ?AST 344*  --   --   --   --   ?ALT  178*  --   --   --   --   ?ALKPHOS 109  --   --   --   --   ?BILITOT 0.7  --   --   --   --   ?GFRNONAA 30*   < > 54* >60 >60  ?ANIONGAP 17*   < > 7 11 10   ? < > = values in this interval not displayed.  ?  ?Lipids  ?Recent Labs  ?Lab 08/06/21 ?0352  ?TRIG 106  ?  ?Hematology ?Recent Labs  ?Lab 08/05/21 ?0412 08/05/21 ?10/05/21 08/05/21 ?1105 08/06/21 ?0352 08/07/21 ?10/07/21  ?WBC 9.7  --   --  7.0 7.9  ?RBC 3.47*  --   --  3.36* 3.72*  ?HGB 10.9*   < > 10.5* 10.5* 11.6*  ?HCT 34.8*   < > 31.0* 32.7* 35.3*  ?MCV 100.3*  --   --  97.3 94.9  ?MCH 31.4  --   --  31.3 31.2  ?MCHC 31.3  --   --  32.1 32.9  ?RDW 16.0*  --   --  15.9* 15.8*  ?PLT 141*  --   --  133* 171  ? < > = values in this interval not displayed.  ? ?Thyroid No results for input(s): TSH, FREET4 in the last 168 hours.  ?BNPNo results for input(s): BNP, PROBNP in the last 168 hours.  ?DDimer No results for input(s): DDIMER in the last 168 hours.  ? ?Radiology  ?  ?No results found. ? ?Cardiac Studies  ? ?Echo 08/03/21: ? 1. Left ventricular ejection fraction, by estimation, is 30 to 35%. The  ?left ventricle has moderately decreased function. The left ventricle  ?demonstrates global hypokinesis. Left ventricular diastolic parameters are  ?consistent with Grade I diastolic  ?dysfunction (impaired  relaxation).  ? 2. Right ventricular systolic function is severely reduced. The right  ?ventricular size is moderately enlarged.  ? 3. The mitral valve is normal in structure. No evidence of mitral valve  ?regurgitation. No evidence of mitral stenosis.  ? 4. The aortic valve has an indeterminant number of cusps. Aortic valve  ?regurgitation is not visualized. No aortic stenosis is present.  ? 5. The inferior vena cava is dilated in size with <50% respiratory  ?variability, suggesting right atrial pressure of 15 mmHg.  ? ?Patient Profile  ?   ?46 y.o. female with a hx of pseudotumor cerebri, anxiety, depression, PTSD, schizoaffective disorder, and chronic opioid use who is being seen 08/07/2021 for the evaluation of cardiac arrest. ? ?Assessment & Plan  ?  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 ? ?Hypertension ?- hold home verapamil - will not resume this at discharge  ?- losartan as above ? ? ?Sinus tachycardia ?- in the setting of withdrawal and weaning psych medications ?- BB as above ?   ? ?For questions or updates, please contact [facility] ?Please consult [email or web address] for contact info under  ? ?  ?   ?Signed, ?10/07/2021, PA  ?08/08/2021, 10:15 AM   ? ?

## 2021-08-08 NOTE — Progress Notes (Signed)
Physical Therapy Treatment ?Patient Details ?Name: Audrey [name] ?MRN: [id number] ?DOB: 07-06-1975 ?Today's Date: 08/08/2021 ? ? ?History of Present Illness 46 y.o. female admitted 08/02/21 with OOH cardiac arrest. CPR performed for ~30 min. Per chart, pt with multiple missing home opiate meds, suspicious for possible unintentional vs intentional OD as cause of cardiac arrest. ETT (5/6 - 5/8, 59-5/10). Pt also with aspiration pneuomonitis. CT of head and neck unremarkable. PMH: anxiety, arthritis, depression, heart murmur, pseudotumor cerebri, PTSD, schizo affective schizophrenia, sciatica ? ?  ?PT Comments  ? ? Pt pleasant and very willing to progress mobility and function. Pt reports furniture walking in home and not using RW with improved performance across room today with use of RW limited by chest pain (post compressions) and HR. Pt educated for seated HEp and activity progression with benefit of rollator to be able to rest and resume progressive activities at D/C. Plan remains appropriate and will continue to follow.  ? ?HR 110 at rest with rise to 145 with gait, 120 at rest ?SpO2 >91% on RA ?  ?Recommendations for follow up therapy are one component of a multi-disciplinary discharge planning process, led by the attending physician.  Recommendations may be updated based on patient status, additional functional criteria and insurance authorization. ? ?Follow Up Recommendations ? Acute inpatient rehab (3hours/day) ?  ?  ?Assistance Recommended at Discharge Frequent or constant Supervision/Assistance  ?Patient can return home with the following Assistance with cooking/housework;Direct supervision/assist for medications management;Direct supervision/assist for financial management;Assist for transportation;A little help with walking and/or transfers;A little help with bathing/dressing/bathroom ?  ?Equipment Recommendations ? Rollator (4 wheels)  ?  ?Recommendations for Other Services   ? ? ?  ?Precautions /  Restrictions Precautions ?Precautions: Fall ?Precaution Comments: watch HR  ?  ? ?Mobility ? Bed Mobility ?Overal bed mobility: Needs Assistance ?Bed Mobility: Rolling, Sidelying to Sit ?Rolling: Min assist ?Sidelying to sit: Min assist ?  ?  ?  ?General bed mobility comments: physical assist to roll and rise from surface with increased time and cues for sequence limited by pain ?  ? ?Transfers ?Overall transfer level: Needs assistance ?  ?Transfers: Sit to/from Stand ?Sit to Stand: Min guard ?  ?  ?  ?  ?  ?General transfer comment: pt able to rise from surface with guarding and cues ?  ? ?Ambulation/Gait ?Ambulation/Gait assistance: Min guard ?Gait Distance (Feet): 50 Feet ?Assistive device: Rolling walker (2 wheels) ?Gait Pattern/deviations: Step-through pattern, Decreased stride length, Trunk flexed ?  ?Gait velocity interpretation: <1.8 ft/sec, indicate of risk for recurrent falls ?  ?General Gait Details: pt with flexed trunk with cues for posture, proximity to RW and limited gait distance with HR 145 and fatigue ? ? ?Stairs ?  ?  ?  ?  ?  ? ? ?Wheelchair Mobility ?  ? ?Modified Rankin (Stroke Patients Only) ?  ? ? ?  ?Balance Overall balance assessment: Needs assistance ?  ?Sitting balance-Audrey [name] Scale: Fair ?Sitting balance - Comments: static sitting without UE support ?  ?Standing balance support: Bilateral upper extremity supported, During functional activity, Reliant on assistive device for balance ?Standing balance-Audrey [name] Scale: Poor ?Standing balance comment: Reliant on RW and guarding ?  ?  ?  ?  ?  ?  ?  ?  ?  ?  ?  ?  ? ?  ?Cognition Arousal/Alertness: Awake/alert ?Behavior During Therapy: Flat affect ?Overall Cognitive Status: Impaired/Different from baseline ?Area of Impairment: Memory ?  ?  ?  ?  ?  ?  ?  ?  ?  ?  ?  Memory: Decreased short-term memory ?  ?  ?  ?Problem Solving: Slow processing ?General Comments: pt with flat affect, responds to questions with spouse interjecting at times. pt  repeating some statements during session ?  ?  ? ?  ?Exercises General Exercises - Lower Extremity ?Long Arc Quad: AROM, Both, Seated, 20 reps ?Hip Flexion/Marching: AROM, Both, Seated, 10 reps ? ?  ?General Comments   ?  ?  ? ?Pertinent Vitals/Pain Pain Assessment ?Pain Score: 5  ?Pain Location: mid-back (chronic per pt) ?Pain Descriptors / Indicators: Discomfort ?Pain Intervention(s): Limited activity within patient's tolerance, Monitored during session, Repositioned, Premedicated before session  ? ? ?Home Living   ?  ?  ?  ?  ?  ?  ?  ?  ?  ?   ?  ?Prior Function    ?  ?  ?   ? ?PT Goals (current goals can now be found in the care plan section) Progress towards PT goals: Progressing toward goals ? ?  ?Frequency ? ? ? Min 3X/week ? ? ? ?  ?PT Plan Current plan remains appropriate  ? ? ?Co-evaluation   ?  ?  ?  ?  ? ?  ?AM-PAC PT "6 Clicks" Mobility   ?Outcome Measure ? Help needed turning from your back to your side while in a flat bed without using bedrails?: A Little ?Help needed moving from lying on your back to sitting on the side of a flat bed without using bedrails?: A Little ?Help needed moving to and from a bed to a chair (including a wheelchair)?: A Little ?Help needed standing up from a chair using your arms (e.g., wheelchair or bedside chair)?: A Little ?Help needed to walk in hospital room?: A Little ?Help needed climbing 3-5 steps with a railing? : Total ?6 Click Score: 16 ? ?  ?End of Session   ?Activity Tolerance: Patient tolerated treatment well ?Patient left: in chair;with call bell/phone within reach;with nursing/sitter in room;with family/visitor present ?Nurse Communication: Mobility status ?PT Visit Diagnosis: Other abnormalities of gait and mobility (R26.89);Muscle weakness (generalized) (M62.81);Difficulty in walking, not elsewhere classified (R26.2) ?  ? ? ?Time: 6761-9509 ?PT Time Calculation (min) (ACUTE ONLY): 33 min ? ?Charges:  $Gait Training: 8-22 mins ?$Therapeutic Exercise: 8-22  mins          ?          ? ?Lerone Onder P, PT ?Acute Rehabilitation Services ?Pager: [phone] ?Office: [phone] ? ? ? ?Mervyn Pflaum B Bobby Barton ?08/08/2021, 1:17 PM ? ?

## 2021-08-08 NOTE — Consult Note (Signed)
[name] Psychiatry Followup Face-to-Face Psychiatric Evaluation ? ? ?Service Date: Aug 08, 2021 ?LOS:  LOS: 6 days  ? ? ?Assessment  ?Audrey [name] is a 46 y.o. female admitted medically for 08/02/2021  6:21 PM for treatment of cardiac arrest likely 2/2 medication overdose. She carries the psychiatric diagnoses of anxiety, depression, PTSD, and schizoaffective disorder. and has a past medical history of  arthritis, heart murmur, Pseudotumor cerebri, and sciatica. Psychiatry was consulted for suicide attempt by primary provider.  ? ?Her initial presentation of cardiac arrest in the setting of numerous missing pillsis most consistent with unsuccessful suicide attempt in the setting of known depression. She was previously diagnosed with anxiety, depression, and schizoaffective disorder. Her presentation during interview was consistent with worsening depressive disorder as evidenced by declining interest in things she used to enjoy, continued sleep disturbances, minimal appetite, and new hallucinations since the last year. She reported that she had been doing fine but started to notice a decline in these areas over the last few weeks leading up to her retrograde amnesia immediately leading up to her hospitalization.  ? ?Current outpatient psychotropic medications include clonazepam, duloxetine, gabapentin, mirtazapine, and quetiapine and historically she has had an poor response to these medications. She was compliant with medications prior to admission. Please see plan below for detailed recommendations.  ? ?On followup exam 5/12, pt still does not remember events leading to hospitalization (unlikely she will at this point) but agrees suicide attempt most likely explanation. Focused on working with PT to go to [employer].  ? ?Diagnoses:  ?Active Hospital problems: ?Principal Problem: ?  Cardiac arrest [facility]) ?Active Problems: ?  Goals of care, counseling/discussion ?  ? ?Plan  ?## Safety and Observation  Level:  ?- Based on my clinical evaluation, I estimate the patient to be at high risk of self harm in the current setting ?- At this time, we recommend a 1v1 level of observation. This decision is based on my review of the chart including patient's history and current presentation, interview of the patient, mental status examination, and consideration of suicide risk including evaluating suicidal ideation, plan, intent, suicidal or self-harm behaviors, risk factors, and protective factors. This judgment is based on our ability to directly address suicide risk, implement suicide prevention strategies and develop a safety plan while the patient is in the clinical setting. Please contact our team if there is a concern that risk level has changed. ? ? ?## Medications:  ?- continue daily dosage of Cymbalta to 60 mg (reduced from home dose of 180 mg) ?- continue Klonopin ay 1 mg BID (reduced from home dose of 1 mg TID) ?- increase Seroquel to 200 mg QHS (takes 300 mg at home) ?- continue Remeron 15 mg at bedtime ? ? ? ?## Medical Decision Making Capacity:  ?Intact. ? ?## Further Work-up:  ?-- TSH, B12 ? ? ?## Disposition:  ?-- recommend inpatient psychiatric placement ? ? ?##Legal Status ?-- Not currently under IVC and amenable to voluntary psychiatric placement ? ?Thank you for this consult request. Recommendations have been communicated to the primary team.  We will continue following at this time.  ? ?Claris Che A Aradia Estey ? ? ?History  ?Relevant Aspects of Hospital Course:  ?Admitted on 08/02/2021 for cardiac arrest 2/2 medication overdose. Was treated appropriately and resuscitated.  ? ?Patient Report:  ?Patient seen in late afternoon. Husband remains at bedside per pt request. ? ?Endorses pain in chest. Has been working really hard in PT to hopefully  get well enough for inpt psych treatment. Hopeful she will improve and thankful she lived through OD attempt. Discussed changes made to medications - pt and husband in  agreement. No active SI, HI, AH/VH. Slept poorly -discussed inc quetiapine.  ? ? ?ROS:  ?Significant pain in chest ? ?Collateral information:  ?Frequent requests for pain meds per nursing staff ? ?Psychiatric History/social/family/substance/medical/surgical history:  ?See initial consult note. ? ?Medications:  ? ?Current Facility-Administered Medications:  ??  acetaminophen (TYLENOL) tablet 650 mg, 650 mg, Oral, Q4H PRN, 650 mg at 08/07/21 1008 **OR** acetaminophen (TYLENOL) 160 MG/5ML solution 650 mg, 650 mg, Per Tube, Q4H PRN **OR** acetaminophen (TYLENOL) suppository 650 mg, 650 mg, Rectal, Q4H PRN, Lorin Glass, MD ??  albuterol (PROVENTIL) (2.5 MG/3ML) 0.083% nebulizer solution 2.5 mg, 2.5 mg, Nebulization, Q2H PRN, Lorin Glass, MD, 2.5 mg at 08/05/21 0041 ??  chlorhexidine (PERIDEX) 0.12 % solution 15 mL, 15 mL, Mouth Rinse, BID, Lorin Glass, MD, 15 mL at 08/08/21 0955 ??  clonazePAM (KLONOPIN) tablet 1 mg, 1 mg, Oral, BID, Palma Buster A, 1 mg at 08/08/21 0950 ??  docusate sodium (COLACE) capsule 100 mg, 100 mg, Oral, BID PRN, Paytes, Austin A, RPH ??  DULoxetine (CYMBALTA) DR capsule 60 mg, 60 mg, Oral, Daily, Cornell Gaber A, 60 mg at 08/08/21 0950 ??  furosemide (LASIX) tablet 40 mg, 40 mg, Oral, BID, Lorin Glass, MD, 40 mg at 08/08/21 0950 ??  gabapentin (NEURONTIN) capsule 200 mg, 200 mg, Oral, TID, Paytes, Austin A, RPH, 200 mg at 08/08/21 1524 ??  heparin injection 5,000 Units, 5,000 Units, Subcutaneous, Q8H, Harris, Whitney D, NP, 5,000 Units at 08/08/21 1500 ??  HYDROmorphone (DILAUDID) tablet 1 mg, 1 mg, Oral, Q3H PRN, Lorin Glass, MD, 1 mg at 08/08/21 1524 ??  lip balm (CARMEX) ointment, , Topical, PRN, Lorin Glass, MD ??  losartan (COZAAR) tablet 25 mg, 25 mg, Oral, Daily, Jonita Albee, PA-C, 25 mg at 08/08/21 0950 ??  MEDLINE mouth rinse, 15 mL, Mouth Rinse, q12n4p, Lorin Glass, MD, 15 mL at 08/07/21 1731 ??  mirtazapine (REMERON) tablet 15 mg, 15  mg, Oral, QHS, Jaleel Allen A, 15 mg at 08/07/21 2107 ??  naloxone (NARCAN) injection, , Intravenous, PRN, Eber Hong, MD, 2 mg at 08/02/21 1819 ??  ondansetron (ZOFRAN) injection 4 mg, 4 mg, Intravenous, Q6H PRN, Janeann Forehand D, NP, 4 mg at 08/07/21 0434 ??  pantoprazole (PROTONIX) EC tablet 40 mg, 40 mg, Oral, Daily, Paytes, Austin A, RPH, 40 mg at 08/08/21 0950 ??  perflutren lipid microspheres (DEFINITY) IV suspension, 1-10 mL, Intravenous, PRN, Lorin Glass, MD, 3 mL at 08/08/21 1526 ??  polyethylene glycol (MIRALAX / GLYCOLAX) packet 17 g, 17 g, Oral, Daily PRN, Paytes, Austin A, RPH ??  potassium chloride SA (KLOR-CON M) CR tablet 20 mEq, 20 mEq, Oral, BID, Lorin Glass, MD, 20 mEq at 08/08/21 0950 ??  QUEtiapine (SEROQUEL) tablet 200 mg, 200 mg, Oral, QHS, Carolie Mcilrath A ??  tiZANidine (ZANAFLEX) tablet 2 mg, 2 mg, Oral, BID, Paytes, Austin A, RPH, 2 mg at 08/08/21 0950 ? ?Allergies: ?Allergies  ?Allergen Reactions  ?? Penicillins Anaphylaxis and Rash  ?? Prochlorperazine Anaphylaxis and Nausea And Vomiting  ?? Acetazolamide Other (See Comments)  ?  Joint pain  ?? Latex Hives and Swelling  ?? Propoxyphene Nausea And Vomiting  ?? Nitroglycerin Er Swelling and Other (See Comments)  ?  Body swells, but no breathing issues  ? ? ? ?  ?  Objective  ?Vital signs:  ?Temp:  [98 ?F (36.7 ?C)-98.3 ?F (36.8 ?C)] 98 ?F (36.7 ?C) (05/12 1603) ?Pulse Rate:  [97-115] 100 (05/12 1332) ?Resp:  [11-20] 20 (05/12 1332) ?BP: (114-139)/(51-94) 114/77 (05/12 1603) ?SpO2:  [93 %-100 %] 93 % (05/12 1332) ?Weight:  [128.7 kg] 128.7 kg (05/12 0450) ? ?Psychiatric Specialty Exam: ? ?Presentation  ?General Appearance: Fairly Groomed ? ?Eye Contact:Good ? ?Speech:Clear and Coherent (normal rate today) ? ?Speech Volume:Normal ? ?Handedness:No data recorded ? ?Mood and Affect  ?Mood:-- (Determined) ? ?Affect:Congruent ? ? ?Thought Process  ?Thought Processes:Coherent; Linear ? ?Descriptions of  Associations:Intact ? ?Orientation:Full (Time, Place and Person) ? ?Thought Content:Logical ? ?History of Schizophrenia/Schizoaffective disorder:No data recorded ?Duration of Psychotic Symptoms:Greater than six months ? ?Gaylyn RongHa

## 2021-08-08 NOTE — Progress Notes (Signed)
?  Echocardiogram ?2D Echocardiogram has been performed. ? ?Gerda Diss ?08/08/2021, 3:22 PM ?

## 2021-08-08 NOTE — Progress Notes (Signed)
Inpatient Rehab Admissions Coordinator:  ? ?[facility] consult received. Unfortunately, [facility] is unable to accept patients with recommendations for inpatient psychiatric treatment. [facility] will not pursue an admission for this Pt. ? ?Megan Salon, MS, CCC-SLP ?Rehab Admissions Coordinator  ?[phone] (celll) ?[phone] (office) ? ?

## 2021-08-09 DIAGNOSIS — I429 Cardiomyopathy, unspecified: Secondary | ICD-10-CM

## 2021-08-09 DIAGNOSIS — F251 Schizoaffective disorder, depressive type: Secondary | ICD-10-CM | POA: Diagnosis not present

## 2021-08-09 DIAGNOSIS — I469 Cardiac arrest, cause unspecified: Secondary | ICD-10-CM | POA: Diagnosis not present

## 2021-08-09 DIAGNOSIS — R000 Tachycardia, unspecified: Secondary | ICD-10-CM

## 2021-08-09 LAB — BASIC METABOLIC PANEL
Anion gap: 8 (ref 5–15)
BUN: 13 mg/dL (ref 6–20)
CO2: 30 mmol/L (ref 22–32)
Calcium: 9.3 mg/dL (ref 8.9–10.3)
Chloride: 102 mmol/L (ref 98–111)
Creatinine, Ser: 0.87 mg/dL (ref 0.44–1.00)
GFR, Estimated: >60 mL/min (ref >60)
Glucose, Bld: 106 mg/dL — ABNORMAL HIGH (ref 70–99)
Potassium: 4.3 mmol/L (ref 3.5–5.1)
Sodium: 140 mmol/L (ref 135–145)

## 2021-08-09 MED ORDER — CYANOCOBALAMIN 1000 MCG/ML IJ SOLN
1000.0000 ug | Freq: Every day | INTRAMUSCULAR | Status: AC
  Administered 2021-08-09 – 2021-08-11 (×3): 1000 ug via INTRAMUSCULAR
  Filled 2021-08-09 (×3): qty 1

## 2021-08-09 MED ORDER — MUPIROCIN 2 % EX OINT
TOPICAL_OINTMENT | Freq: Two times a day (BID) | CUTANEOUS | Status: DC
  Administered 2021-08-10: 1 via TOPICAL
  Filled 2021-08-09: qty 22

## 2021-08-09 MED ORDER — VITAMIN B-12 1000 MCG PO TABS
1000.0000 ug | ORAL_TABLET | Freq: Every day | ORAL | Status: DC
  Administered 2021-08-12 – 2021-08-13 (×2): 1000 ug via ORAL
  Filled 2021-08-09 (×2): qty 1
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
## 2021-08-09 NOTE — Progress Notes (Signed)
? ?Progress Note ? ?Patient Name: Marcelle SmilingKimberly D Sabella ?Date of Encounter: 08/09/2021 ? ?[facility] Cardiologist: Jodelle RedBridgette Christopher, MD  ? ?Subjective  ? ?Chest wall pain, some ongoing SOB ? ?Inpatient Medications  ?  ?Scheduled Meds: ? chlorhexidine  15 mL Mouth Rinse BID  ? clonazePAM  1 mg Oral BID  ? DULoxetine  60 mg Oral Daily  ? furosemide  40 mg Oral BID  ? gabapentin  200 mg Oral TID  ? heparin  5,000 Units Subcutaneous Q8H  ? losartan  25 mg Oral Daily  ? mouth rinse  15 mL Mouth Rinse q12n4p  ? mirtazapine  15 mg Oral QHS  ? pantoprazole  40 mg Oral Daily  ? potassium chloride  20 mEq Oral BID  ? QUEtiapine  200 mg Oral QHS  ? tiZANidine  2 mg Oral BID  ? vitamin B-12  1,000 mcg Oral Daily  ? ?Continuous Infusions: ? ?PRN Meds: ?acetaminophen **OR** acetaminophen (TYLENOL) oral liquid 160 mg/5 mL **OR** acetaminophen, albuterol, docusate sodium, HYDROmorphone, lip balm, naloxone, ondansetron (ZOFRAN) IV, polyethylene glycol  ? ?Vital Signs  ?  ?Vitals:  ? 08/08/21 1603 08/08/21 2030 08/09/21 0036 08/09/21 0417  ?BP: 114/77 (!) 141/78 114/78 118/87  ?Pulse:  100 95 99  ?Resp:  16 16 14   ?Temp: 98 ?F (36.7 ?C) 98.1 ?F (36.7 ?C) 98.7 ?F (37.1 ?C) 98.9 ?F (37.2 ?C)  ?TempSrc: Oral Oral Oral Axillary  ?SpO2:  92% 96% 97%  ?Weight:    130.3 kg  ?Height:      ? ? ?Intake/Output Summary (Last 24 hours) at 08/09/2021 0753 ?Last data filed at 08/08/2021 1805 ?Gross per 24 hour  ?Intake 480 ml  ?Output --  ?Net 480 ml  ? ? ?  08/09/2021  ?  4:17 AM 08/08/2021  ?  4:50 AM 08/07/2021  ?  5:00 AM  ?Last 3 Weights  ?Weight (lbs) 287 lb 4.2 oz 283 lb 11.7 oz 292 lb 1.8 oz  ?Weight (kg) 130.3 kg 128.7 kg 132.5 kg  ?   ? ?Telemetry  ?  ?Sinus tach - Personally Reviewed ? ?ECG  ?  ?N/a - Personally Reviewed ? ?Physical Exam  ? ?GEN: No acute distress.   ?Neck: No JVD ?Cardiac: RRR, no murmurs, rubs, or gallops.  ?Respiratory: Clear to auscultation bilaterally. ?GI: Soft, nontender, non-distended  ?MS: No edema; No  deformity. ?Neuro:  Nonfocal  ?Psych: Normal affect  ? ?Labs  ?  ?High Sensitivity Troponin:   ?Recent Labs  ?Lab 08/02/21 ?1828 08/02/21 ?2047  ?TROPONINIHS 62* 92*  ?   ?Chemistry ?Recent Labs  ?Lab 08/02/21 ?1828 08/03/21 ?0019 08/06/21 ?[id number] 08/07/21 ?[id number] 08/08/21 ?0220 08/09/21 ?0358  ?NA 144   < > 144 143 140 140  ?K 4.0   < > 3.5 3.3* 4.6 4.3  ?CL 111   < > 107 104 101 102  ?CO2 16*   < > 30 28 29 30   ?GLUCOSE 154*   < > 133* 83 95 106*  ?BUN 15   < > 24* 20 14 13   ?CREATININE 2.04*   < > 1.24* 1.03* 0.85 0.87  ?CALCIUM 7.5*   < > 8.5* 8.6* 8.9 9.3  ?MG 2.2   < > 1.9 1.7 2.5*  --   ?PROT 5.2*  --   --   --   --   --   ?ALBUMIN 2.8*  --   --   --   --   --   ?AST 344*  --   --   --   --   --   ?  ALT 178*  --   --   --   --   --   ?ALKPHOS 109  --   --   --   --   --   ?BILITOT 0.7  --   --   --   --   --   ?GFRNONAA 30*   < > 54* >60 >60 >60  ?ANIONGAP 17*   < > 7 11 10 8   ? < > = values in this interval not displayed.  ?  ?Lipids  ?Recent Labs  ?Lab 08/06/21 ?0352  ?TRIG 106  ?  ?Hematology ?Recent Labs  ?Lab 08/06/21 ?0352 08/07/21 ?10/07/21 08/08/21 ?1301  ?WBC 7.0 7.9 8.4  ?RBC 3.36* 3.72* 4.22  ?HGB 10.5* 11.6* 13.1  ?HCT 32.7* 35.3* 41.2  ?MCV 97.3 94.9 97.6  ?MCH 31.3 31.2 31.0  ?MCHC 32.1 32.9 31.8  ?RDW 15.9* 15.8* 15.8*  ?PLT 133* 171 207  ? ?Thyroid  ?Recent Labs  ?Lab 08/08/21 ?1301  ?TSH 4.636*  ?  ?BNPNo results for input(s): BNP, PROBNP in the last 168 hours.  ?DDimer No results for input(s): DDIMER in the last 168 hours.  ? ?Radiology  ?  ?ECHOCARDIOGRAM LIMITED ? ?Result Date: 08/08/2021 ?   ECHOCARDIOGRAM LIMITED REPORT   Patient Name:   KAITLINN IVERSEN Date of Exam: 08/08/2021 Medical Rec #:  10/08/2021            Height:       64.0 in Accession #:    [id number]           Weight:       283.7 lb Date of Birth:  12-22-1975            BSA:          2.270 m? Patient Age:    46 years             BP:           125/78 mmHg Patient Gender: F                    HR:           104 bpm. Exam Location:   Inpatient Procedure: Cardiac Doppler, Color Doppler, Limited Echo and Intracardiac            Opacification Agent Indications:    Cardiac arrest  History:        Patient has prior history of Echocardiogram examinations, most                 recent 08/03/2021.  Sonographer:    10/03/2021 RDCS (AE) Referring Phys: Ross Ludwig [id number]  Sonographer Comments: Patient is morbidly obese. Image acquisition challenging due to patient body habitus. IMPRESSIONS  1. LV and RV function have significantly improved from prior echo. EF approximately 50-55%. FINDINGS  Left Ventricle: LV and RV function have significantly improved from prior echo. EF approximately 50-55%. Definity contrast agent was given IV to delineate the left ventricular endocardial borders. LEFT VENTRICLE PLAX 2D LVIDd:         4.10 cm LVIDs:         3.00 cm LV PW:         1.20 cm LV IVS:        1.40 cm LVOT diam:     2.10 cm LVOT Area:     3.46 cm?  LEFT ATRIUM         Index LA diam:  3.20 cm 1.41 cm/m?   AORTA Ao Root diam: 3.50 cm Ao Asc diam:  3.50 cm  SHUNTS Systemic Diam: 2.10 cm Carolan Clines Electronically signed by Carolan Clines Signature Date/Time: 08/08/2021/3:33:07 PM    Final    ? ?Cardiac Studies  ? ? ?Patient Profile  ?   ?46 y.o. female with a hx of pseudotumor cerebri, anxiety, depression, PTSD, schizoaffective disorder, and chronic opioid use who is being seen 08/07/2021 for the evaluation of cardiac arrest. ? ?Assessment & Plan  ?  ?1.Cardiac arrest/PEA arrest ?- in setting of drug overdose ? ? ?2.Acute biventricular failure ?- 08/03/21 echo: LVEF 30-35%, severe RV dysfunction ?- 08/08/21 limited echo: LVEF 50-55%, RV function improved ?- very transient dysfunction in setting PEA arrest in setting of opiate overdose, does not appear a primary cardiac pathology. With recovery of function no plans for ischemic testing. No indication for aggressive HF medication regimen. ?- she is on oral lasix ? ? ?3.Aspiration pneumonitis ? ?No further testing or  cardiac interventions planned, we will sign off inpatient care.  ? ?For questions or updates, please contact [facility] ?Please consult [email or web address] for contact info under  ? ?  ?   ?Signed, ?Dina Rich, MD  ?08/09/2021, 7:53 AM   ? ?

## 2021-08-09 NOTE — Progress Notes (Signed)
?Progress Note ? ?Patient: [name] VEL:[id number] DOB: 08/21/1975  ?DOA: 08/02/2021  DOS: 08/09/2021  ?  ?Brief hospital course: ?KHALIYAH NORTHROP is a 46 year old female with a past medical history significant for Pseudotumor cerebri, anxiety, depression, PTSD,, schizoaffective disorder, and chronic opioid use who presented to the ED after suffering outside hospital cardiac arrest.  Per EMS patient was last seen normal per spouse at 4 AM this morning.  She was later found by spouse this afternoon unresponsive prompting EMS call.  Of note patient large volume of hydromorphone tablets (27 tabs missing in 3 dys).  Prolonged arrest in field with 3 arrest on arrival to ED. ?  ?Majority of ED work-up pending at time of critical care evaluation.  All lab work and imaging currently pending. ? ?Assessment and Plan: ?Out of hospital cardiac arrest, stress cardiomyopathy: Improving function by repeat echo 5/12.  ?- Remains some volume up, so continue oral lasix for now, monitor BMP. Continue ARB (was on PTA), may titrate upward. ? ?Amnesia, anoxic encephalopathy: Improving ? ?Aspiration pneumonitis, acute hypoxemic respiratory failure: recurrent 5/9 suspicion for volume overloaded has again resolved. Completed 5 days ceftriaxone, cultures negative. ? ?Fall with right face laceration: CT neck/head benign. Healing as anticipated.  ? ?Hx of Pseudotumor cerebri ? ?History of Schizoaffective disorder, suspect suicide attempt with ongoing depression:  ?- Inpatient psychiatric hospitalization recommended at this time. Currently not IVC. Continue Recruitment consultant.  ?- Continue seroquel 200mg  qHS (was on 100mg  TID PTA) ?- Continue remeron 15mg  qHS ?- Continue clonazepam 1mg  BID (was 1mg  TID PTA) ?- Continue cymbalta 60mg  daily  ? ?Vitamin B12 deficiency with macrocytic anemia:  ?- Supplement by IM x3 days, then orally. ? ?Elevated TSH: Very mild, in severely ill setting.  ?- Recheck at follow up. ? ?AKI due to ATN: Resolved,  had a day of 8L diuresis earlier in the hospitalization. ?   ?Obesity: Estimated body mass index is 49.31 kg/m? as calculated from the following: ?  Height as of this encounter: 5\' 4"  (1.626 m). ?  Weight as of this encounter: 130.3 kg. ? ?Subjective: No new events. Has been getting up to [facility] and to chair and eager to continue gaining strength. Noted bruises to her wrists without being sure how they got there. ? ?Objective: ?Vitals:  ? 08/09/21 0036 08/09/21 0417 08/09/21 0749 08/09/21 1107  ?BP: 114/78 118/87 (!) 135/113 111/72  ?Pulse: 95 99 (!) 108 (!) 116  ?Resp: 16 14 19 20   ?Temp: 98.7 ?F (37.1 ?C) 98.9 ?F (37.2 ?C) 97.9 ?F (36.6 ?C) 98.1 ?F (36.7 ?C)  ?TempSrc: Oral Axillary Oral Oral  ?SpO2: 96% 97% 98% 90%  ?Weight:  130.3 kg    ?Height:      ?Gen: 46 y.o. female in no distress ?Pulm: Nonlabored breathing room air. Clear. ?CV: Regular rate and rhythm. No murmur, rub, or gallop. No definite JVD, no pitting dependent edema. ?GI: Abdomen soft, non-tender, non-distended, with normoactive bowel sounds.  ?Ext: Warm, dry. Volar wrists bilaterally have light ecchymoses and very superficial tiny healing abrasions.  ?Skin: Mid chest with healing abrasions in pattern of automated CPR machine. No exudate or spreading erythema. No rashes, lesions or ulcers on visualized skin. Soles of feet have small calluses without other visible or palpable abnormalities. ?Neuro: Alert and oriented. No focal neurological deficits. Diffuse weakness. ?Psych: Judgement and insight appear fair. Interactive appropriately. ? ?Data Personally reviewed: ?CBC: ?Recent Labs  ?Lab 08/04/21 ?08/11/21 08/05/21 ?0012 08/05/21 ?08/11/21 08/05/21 ? 08/05/21 ?1105 08/06/21 ?  7619 08/07/21 ?5093 08/08/21 ?1301  ?WBC 10.6*  --  9.7  --   --  7.0 7.9 8.4  ?HGB 11.1*   < > 10.9* 10.9* 10.5* 10.5* 11.6* 13.1  ?HCT 33.5*   < > 34.8* 32.0* 31.0* 32.7* 35.3* 41.2  ?MCV 95.2  --  100.3*  --   --  97.3 94.9 97.6  ?PLT 147*  --  141*  --   --  133* 171 207  ? < > =  values in this interval not displayed.  ? ?Basic Metabolic Panel: ?Recent Labs  ?Lab 08/04/21 ?2671 08/05/21 ?0012 08/05/21 ?2458 08/05/21 ?0998 08/05/21 ?1105 08/06/21 ?0352 08/07/21 ?3382 08/08/21 ?0220 08/09/21 ?0358  ?NA 138   < > 142   < > 142 144 143 140 140  ?K 2.8*   < > 5.2*   < > 5.0 3.5 3.3* 4.6 4.3  ?CL 109  --  113*  --   --  107 104 101 102  ?CO2 19*  --  21*  --   --  30 28 29 30   ?GLUCOSE 116*  --  109*  --   --  133* 83 95 106*  ?BUN 20  --  22*  --   --  24* 20 14 13   ?CREATININE 2.32*  --  1.83*  --   --  1.24* 1.03* 0.85 0.87  ?CALCIUM 8.1*  --  9.0  --   --  8.5* 8.6* 8.9 9.3  ?MG 1.7  --  2.1  --   --  1.9 1.7 2.5*  --   ?PHOS 2.4*  --  3.9  --   --  4.1 3.3 4.6  --   ? < > = values in this interval not displayed.  ? ?GFR: ?Estimated Creatinine Clearance: 107.1 mL/min (by C-G formula based on SCr of 0.87 mg/dL). ?Liver Function Tests: ?Recent Labs  ?Lab 08/02/21 ?1828  ?AST 344*  ?ALT 178*  ?ALKPHOS 109  ?BILITOT 0.7  ?PROT 5.2*  ?ALBUMIN 2.8*  ? ?Cardiac Enzymes: ?Recent Labs  ?Lab 08/02/21 ?2047  ?CKTOTAL 109  ? ?CBG: ?Recent Labs  ?Lab 08/06/21 ?1158 08/06/21 ?1202 08/06/21 ?1543 08/06/21 ?2044 08/07/21 ?0030  ?GLUCAP 49* 102* 95 102* 98  ? ?Lipid Profile: ?No results for input(s): CHOL, HDL, LDLCALC, TRIG, CHOLHDL, LDLDIRECT in the last 72 hours. ? ?Thyroid Function Tests: ?Recent Labs  ?  08/08/21 ?1301  ?TSH 4.636*  ? ?Anemia Panel: ?Recent Labs  ?  08/08/21 ?1301  ?VITAMINB12 157*  ? ?ECHOCARDIOGRAM LIMITED 08/08/2021 LV and RV function have significantly improved from prior echo. EF approximately 50-55%. FINDINGS  Left Ventricle: LV and RV function have significantly improved from prior echo. EF approximately 50-55%.   ?  ?10/08/21, MD ?08/09/2021 2:25 PM ?Page by [email or web address]  ?

## 2021-08-09 NOTE — Plan of Care (Signed)
  Problem: Education: Goal: Knowledge of General Education information will improve Description Including pain rating scale, medication(s)/side effects and non-pharmacologic comfort measures Outcome: Progressing   

## 2021-08-10 DIAGNOSIS — F251 Schizoaffective disorder, depressive type: Secondary | ICD-10-CM

## 2021-08-10 DIAGNOSIS — I469 Cardiac arrest, cause unspecified: Secondary | ICD-10-CM | POA: Diagnosis not present

## 2021-08-10 LAB — BASIC METABOLIC PANEL
Anion gap: 9 (ref 5–15)
BUN: 13 mg/dL (ref 6–20)
CO2: 29 mmol/L (ref 22–32)
Calcium: 9.2 mg/dL (ref 8.9–10.3)
Chloride: 100 mmol/L (ref 98–111)
Creatinine, Ser: 0.87 mg/dL (ref 0.44–1.00)
GFR, Estimated: >60 mL/min (ref >60)
Glucose, Bld: 100 mg/dL — ABNORMAL HIGH (ref 70–99)
Potassium: 4.7 mmol/L (ref 3.5–5.1)
Sodium: 138 mmol/L (ref 135–145)

## 2021-08-10 MED ORDER — METOPROLOL SUCCINATE ER 25 MG PO TB24
12.5000 mg | ORAL_TABLET | Freq: Every day | ORAL | Status: DC
  Administered 2021-08-10 – 2021-08-13 (×4): 12.5 mg via ORAL
  Filled 2021-08-10 (×4): qty 1

## 2021-08-10 NOTE — Progress Notes (Signed)
?Progress Note ? ?Patient: [name]:[id number] DOB: 1976-03-04  ?DOA: 08/02/2021  DOS: 08/10/2021  ?  ?Brief hospital course: ?Audrey [name] is a 46 year old female with a past medical history significant for Pseudotumor cerebri, anxiety, depression, PTSD,, schizoaffective disorder, and chronic opioid use who presented to the ED after suffering outside hospital cardiac arrest.  Per EMS patient was last seen normal per spouse at 4 AM this morning.  She was later found by spouse this afternoon unresponsive prompting EMS call.  Of note patient large volume of hydromorphone tablets (27 tabs missing in 3 dys).  Prolonged arrest in field with 3 arrest on arrival to ED. ?  ?Majority of ED work-up pending at time of critical care evaluation.  All lab work and imaging currently pending. ? ?Assessment and Plan: ?Out of hospital cardiac arrest, stress cardiomyopathy: Improving function by repeat echo 5/12.  ?- Continue oral lasix for now, monitor BMP.  ?- Continue ARB (was on PTA), may titrate upward. ?- Start low dose metoprolol ? ?Amnesia, anoxic encephalopathy: Improving ? ?Aspiration pneumonitis, acute hypoxemic respiratory failure: recurrent 5/9 suspicion for volume overloaded has again resolved. Completed 5 days ceftriaxone, cultures negative. ?- Continuing lasix for now, though suspect her nocturnal hypoxia is from OSA. Recommend sleep study after discharge.  ? ?Fall with right face laceration: CT neck/head benign. Healing as anticipated.  ? ?Hx of Pseudotumor cerebri ? ?History of Schizoaffective disorder, suspect suicide attempt with ongoing depression:  ?- Inpatient psychiatric hospitalization recommended at this time. Currently not IVC. Continue Recruitment consultant.  ?- Continue seroquel 200mg  qHS (was on 100mg  TID PTA) ?- Continue remeron 15mg  qHS ?- Continue clonazepam 1mg  BID (was 1mg  TID PTA) ?- Continue cymbalta 60mg  daily  ? ?Vitamin B12 deficiency with macrocytic anemia:  ?- Supplement by IM x3  days, then orally. ? ?Elevated TSH: Very mild, in severely ill setting.  ?- Recheck at follow up. ? ?AKI due to ATN: Resolved, had a day of 8L diuresis earlier in the hospitalization. ?   ?Obesity: Estimated body mass index is 49.16 kg/m? as calculated from the following: ?  Height as of this encounter: 5\' 4"  (1.626 m). ?  Weight as of this encounter: 129.9 kg. ? ?Subjective: Getting up more often, using incentive spirometry. Wants to get stronger, forward looking. Has never needed oxygen before but hasn't had a sleep study and keeps getting told she needs oxygen at night but not during the day while here.  ? ?Objective: ?Vitals:  ? 08/10/21 0924 08/10/21 1211 08/10/21 1613 08/10/21 1617  ?BP:  125/76 (!) 113/43 108/65  ?Pulse: (!) 111 (!) 113 (!) 110   ?Resp:  18 17   ?Temp:  98.9 ?F (37.2 ?C) 98.2 ?F (36.8 ?C)   ?TempSrc:  Oral Oral   ?SpO2: 95% 92% 97% 95%  ?Weight:      ?Height:      ?Gen: 46 y.o. female in no distress ?Pulm: Nonlabored breathing room air. Clear, distant. ?CV: Regular tachycardia. No murmur, rub, or gallop. No JVD, no pitting dependent edema. ?GI: Abdomen soft, non-tender, non-distended, with normoactive bowel sounds.  ?Ext: Warm, no deformities ?Skin: No new rashes, lesions or ulcers on visualized skin. ?Neuro: Alert and oriented. No focal neurological deficits. ?Psych: Judgement and insight appear fair. Behavior is appropriate.   ? ?Data Personally reviewed: ?CBC: ?Recent Labs  ?Lab 08/04/21 ? 08/05/21 ?0012 08/05/21 ?08/12/21 08/05/21 ?57 08/05/21 ?1105 08/06/21 ?0352 08/07/21 ?10/05/21 08/08/21 ?1301  ?WBC 10.6*  --  9.7  --   --  7.0 7.9 8.4  ?HGB 11.1*   < > 10.9* 10.9* 10.5* 10.5* 11.6* 13.1  ?HCT 33.5*   < > 34.8* 32.0* 31.0* 32.7* 35.3* 41.2  ?MCV 95.2  --  100.3*  --   --  97.3 94.9 97.6  ?PLT 147*  --  141*  --   --  133* 171 207  ? < > = values in this interval not displayed.  ? ?Basic Metabolic Panel: ?Recent Labs  ?Lab 08/04/21 ?1443 08/05/21 ?0012 08/05/21 ?1540 08/05/21 ?0867  08/06/21 ?0352 08/07/21 ?6195 08/08/21 ?0932 08/09/21 ?6712 08/10/21 ?4580  ?NA 138   < > 142   < > 144 143 140 140 138  ?K 2.8*   < > 5.2*   < > 3.5 3.3* 4.6 4.3 4.7  ?CL 109  --  113*  --  107 104 101 102 100  ?CO2 19*  --  21*  --  30 28 29 30 29   ?GLUCOSE 116*  --  109*  --  133* 83 95 106* 100*  ?BUN 20  --  22*  --  24* 20 14 13 13   ?CREATININE 2.32*  --  1.83*  --  1.24* 1.03* 0.85 0.87 0.87  ?CALCIUM 8.1*  --  9.0  --  8.5* 8.6* 8.9 9.3 9.2  ?MG 1.7  --  2.1  --  1.9 1.7 2.5*  --   --   ?PHOS 2.4*  --  3.9  --  4.1 3.3 4.6  --   --   ? < > = values in this interval not displayed.  ? ?CBG: ?Recent Labs  ?Lab 08/06/21 ?1158 08/06/21 ?1202 08/06/21 ?1543 08/06/21 ?2044 08/07/21 ?0030  ?GLUCAP 49* 102* 95 102* 98  ? ?Lipid Profile: ?No results for input(s): CHOL, HDL, LDLCALC, TRIG, CHOLHDL, LDLDIRECT in the last 72 hours. ? ?Thyroid Function Tests: ?Recent Labs  ?  08/08/21 ?1301  ?TSH 4.636*  ? ?Anemia Panel: ?Recent Labs  ?  08/08/21 ?1301  ?VITAMINB12 157*  ? ?ECHOCARDIOGRAM LIMITED 08/08/2021 LV and RV function have significantly improved from prior echo. EF approximately 50-55%. FINDINGS  Left Ventricle: LV and RV function have significantly improved from prior echo. EF approximately 50-55%.   ?  ?10/08/21, MD ?08/10/2021 4:51 PM ?[email or web address]  ?

## 2021-08-11 DIAGNOSIS — F251 Schizoaffective disorder, depressive type: Secondary | ICD-10-CM | POA: Diagnosis not present

## 2021-08-11 DIAGNOSIS — I469 Cardiac arrest, cause unspecified: Secondary | ICD-10-CM | POA: Diagnosis not present

## 2021-08-11 LAB — BASIC METABOLIC PANEL
Anion gap: 8 (ref 5–15)
BUN: 10 mg/dL (ref 6–20)
CO2: 33 mmol/L — ABNORMAL HIGH (ref 22–32)
Calcium: 9.2 mg/dL (ref 8.9–10.3)
Chloride: 100 mmol/L (ref 98–111)
Creatinine, Ser: 0.93 mg/dL (ref 0.44–1.00)
GFR, Estimated: >60 mL/min (ref >60)
Glucose, Bld: 108 mg/dL — ABNORMAL HIGH (ref 70–99)
Potassium: 4 mmol/L (ref 3.5–5.1)
Sodium: 141 mmol/L (ref 135–145)

## 2021-08-11 MED ORDER — ENSURE ENLIVE PO LIQD
237.0000 mL | Freq: Three times a day (TID) | ORAL | Status: DC
  Administered 2021-08-11 – 2021-08-13 (×5): 237 mL via ORAL

## 2021-08-11 MED ORDER — ADULT MULTIVITAMIN W/MINERALS CH
1.0000 | ORAL_TABLET | Freq: Every day | ORAL | Status: DC
  Administered 2021-08-12 – 2021-08-13 (×2): 1 via ORAL
  Filled 2021-08-11 (×2): qty 1

## 2021-08-11 NOTE — Progress Notes (Signed)
Occupational Therapy Treatment ?Patient Details ?Name: Audrey [name] ?MRN: [id number] ?DOB: 1975/07/19 ?Today's Date: 08/11/2021 ? ? ?History of present illness 46 y.o. female admitted 08/02/21 with OOH cardiac arrest. CPR performed for ~30 min. Per chart, pt with multiple missing home opiate meds, suspicious for possible unintentional vs intentional OD as cause of cardiac arrest. ETT (5/6 - 5/8, 59-5/10). Pt also with aspiration pneuomonitis. CT of head and neck unremarkable. PMH: anxiety, arthritis, depression, heart murmur, pseudotumor cerebri, PTSD, schizo affective schizophrenia, sciatica ?  ?OT comments ? Pt participated in ADL retraining session today. She was Mod I bed mobility, supervision sit to stand & supervision-min guard assist secondary to lines only ambulating to bathroom using RW. No hands on assist required. No LOB or unsteady gait noted. Pt then stood at sink for grooming but became tired and sat in chair at sink to complete bathing and dressing today. Min A to wash her back. Pt was given pain meds just prior to treatment session. She then ambulated in room at supervision level. Pt was educated in basic energy conservation techniques verbally. Discussed SNF rehab w/ pt vs home with HHOT. Pt is currently undecided. Pt was left sitting up in chair with sitter in room.  ? ?Recommendations for follow up therapy are one component of a multi-disciplinary discharge planning process, led by the attending physician.  Recommendations may be updated based on patient status, additional functional criteria and insurance authorization. ?   ?Follow Up Recommendations ? Skilled nursing-short term rehab (<3 hours/day)  ?  ?Assistance Recommended at Discharge Intermittent Supervision/Assistance  ?Patient can return home with the following ? Assistance with cooking/housework;Assist for transportation;A little help with walking and/or transfers;A little help with bathing/dressing/bathroom ?  ?Equipment  Recommendations ? None recommended by OT  ?  ?Recommendations for Other Services   ? ?  ?Precautions / Restrictions Precautions ?Precautions: Fall ?Precaution Comments: watch HR ?Restrictions ?Weight Bearing Restrictions: No  ? ? ?  ? ?Mobility Bed Mobility ?Overal bed mobility: Modified Independent ?Bed Mobility: Sidelying to Sit, Supine to Sit ?  ?Sidelying to sit: Modified independent (Device/Increase time), HOB elevated ?Supine to sit: Modified independent (Device/Increase time), HOB elevated ?  ?  ?General bed mobility comments: Pt was Mod I bed moblity w/ HOB elevated and Mod I sitting up at EOB w/o c/o pain. ?  ? ?Transfers ?Overall transfer level: Needs assistance ?Equipment used: Rolling walker (2 wheels) ?Transfers: Sit to/from Stand ?Sit to Stand: Supervision ?  ?  ?Step pivot transfers: Supervision ?  ?  ?General transfer comment: Pt was overall supervision assist for transfers and functional mobility secondary to lines ?  ?  ?Balance Overall balance assessment: Needs assistance ?Sitting-balance support: Single extremity supported, No upper extremity supported, Feet supported, Feet unsupported ?Sitting balance-Leahy Scale: Good ?Sitting balance - Comments: static sitting without UE support ?  ?Standing balance support: Single extremity supported, During functional activity ?Standing balance-Leahy Scale: Fair ?  ?   ? ?ADL either performed or assessed with clinical judgement  ? ?ADL Overall ADL's : Needs assistance/impaired ?  ?  ?Grooming: Wash/dry hands;Wash/dry face;Oral care;Standing;Sitting;Supervision/safety;Set up ?Grooming Details (indicate cue type and reason): Standing and sitting at sink ?Upper Body Bathing: Minimal assistance;Sitting ?  ?Lower Body Bathing: Moderate assistance;Sit to/from stand ?  ?Upper Body Dressing : Minimal assistance;Sitting ?Upper Body Dressing Details (indicate cue type and reason): Don/doff gown after bathing ?  ?  ?Toilet Transfer: Ambulation;Regular Toilet;Grab  bars;Supervision/safety ?Toilet Transfer Details (indicate cue type and reason): Supervison for  lines and Min vc's for RW sequencing ?Toileting- Clothing Manipulation and Hygiene: Modified independent;Sit to/from stand;Supervision/safety ?Toileting - Clothing Manipulation Details (indicate cue type and reason): Supervision secondary to lines ?  ?  ?Functional mobility during ADLs: Supervision/safety;Min guard;Rolling walker (2 wheels) ?General ADL Comments: Pt participated in ADL retraining session today. She was Mod I bed mobility, supervision sit to stand ambulating to bathroom using RW. Supervision-min guard assist secondary to lines only. No hands on assist required and LOB or unsteady gait noted. Pt then stood at sink for grooming but then became tired and sat in chair at sink to complete bathing and dressing today. Min A to wash her back. Pt was given pain meds just prior to treatment session. Pt then ambulated in room at supervision level. Pt was educated in basic energy conservation technique verbally. Pt was left sitting up in chair with sitter in room. ?  ? ?Extremity/Trunk Assessment Upper Extremity Assessment ?Upper Extremity Assessment: Generalized weakness;Overall [facility]FL for tasks assessed ?  ?Lower Extremity Assessment ?Lower Extremity Assessment: Defer to PT evaluation ?  ?Cervical / Trunk Assessment ?Cervical / Trunk Assessment: Other exceptions ?Cervical / Trunk Exceptions: increased body habitus, hx of chronic mid-back pain ?  ? ?Vision Patient Visual Report: No change from baseline ?  ?  ?   ?   ? ?Cognition Arousal/Alertness: Awake/alert ?Behavior During Therapy: [facility]FL for tasks assessed/performed, Flat affect ?Overall Cognitive Status: Impaired/Different from baseline ?  ?  ?Current Attention Level: Sustained ?  ?Awareness: Anticipatory ?  ?General Comments: pt with flat affect but eager to answer questions today. Decreased awareness of deficits ?  ?  ?   ?   ?   ?General Comments Pt with O2 95% on  RA during ADL's. HR ranging 100-120s during functional activity  ? ? ?Pertinent Vitals/ Pain       Pain Assessment ?Pain Assessment: 0-10 ?Pain Score: 7  ?Pain Location: "My whole body and chest" per pt ?Pain Descriptors / Indicators: Discomfort ?Pain Intervention(s): Limited activity within patient's tolerance, Monitored during session, Repositioned, RN gave pain meds during session ? ?Home Living  Refer to initial eval for PLOF ?  ? ?  ?Prior Functioning/Environment   Refer to initial eval for PLOF. Pt reports PRN assist for ADL's while ambulating with RW.  ? ?Frequency ? Min 2X/week  ? ? ? ? ?  ?Progress Toward Goals ? ?OT Goals(current goals can now be found in the care plan section) ? Progress towards OT goals: Progressing toward goals ? ?Acute Rehab OT Goals ?Patient Stated Goal: "I don't know, I may go to rehab, I may go home" ?OT Goal Formulation: With patient ?Time For Goal Achievement: 08/21/21 ?Potential to Achieve Goals: Good  ?Plan Discharge plan needs to be updated   ? ?   ?AM-PAC OT "6 Clicks" Daily Activity     ?Outcome Measure ? ? Help from another person eating meals?: None ?Help from another person taking care of personal grooming?: None ?Help from another person toileting, which includes using toliet, bedpan, or urinal?: A Little ?Help from another person bathing (including washing, rinsing, drying)?: A Little ?Help from another person to put on and taking off regular upper body clothing?: A Little ?Help from another person to put on and taking off regular lower body clothing?: A Lot ?6 Click Score: 19 ? ?  ?End of Session Equipment Utilized During Treatment: Rolling walker (2 wheels) ? ?OT Visit Diagnosis: Unsteadiness on feet (R26.81);Muscle weakness (generalized) (M62.81);Other symptoms and signs involving  cognitive function;Pain ?Pain - part of body:  (Chest and whole body since CPR) ?  ?Activity Tolerance Patient tolerated treatment well ?  ?Patient Left in chair;with call bell/phone within  reach;with nursing/sitter in room ?  ?Nurse Communication   ?  ? ?   ? ?Time: 1019-1050 ?OT Time Calculation (min): 31 min ? ?Charges: OT General Charges ?$OT Visit: 1 Visit ?OT Treatments ?$Self Care/Home Management

## 2021-08-11 NOTE — Progress Notes (Signed)
PT Cancellation Note ? ?Patient Details ?Name: Audrey [name] ?MRN: [id number] ?DOB: 09-07-1975 ? ? ?Cancelled Treatment:    Reason Eval/Treat Not Completed: Patient declined, no reason specified (pt fatigued after finishing OT session and reports pain and fatigue limiting ability to participate at this time) ? ? ?Talley Casco B Willean Schurman ?08/11/2021, 11:07 AM ?Chaise Passarella P, PT ?Acute Rehabilitation Services ?Pager: [phone] ?Office: [phone] ? ?

## 2021-08-11 NOTE — Progress Notes (Signed)
PT Cancellation Note ? ?Patient Details ?Name: Audrey [name] ?MRN: [id number] ?DOB: April 23, 1975 ? ? ?Cancelled Treatment:    Reason Eval/Treat Not Completed: Pain limiting ability to participate (pt with pain 8/10 awaiting pain meds with RN notified) ? ? ?Audrey [name] ?08/11/2021, 12:29 PM ?Audrey [name], PT ?Acute Rehabilitation Services ?Pager: [phone] ?Office: [phone] ? ?

## 2021-08-11 NOTE — Progress Notes (Signed)
?Progress Note ? ?Patient: Audrey [name] [id number] DOB: [birth year]  ?DOA: 08/02/2021  DOS: 08/11/2021  ?  ?Brief hospital course: ?Audrey [name] is a 46 year old female with a past medical history significant for Pseudotumor cerebri, anxiety, depression, PTSD,, schizoaffective disorder, and chronic opioid use who presented to the ED after suffering outside hospital cardiac arrest.  Per EMS patient was last seen normal per spouse at 4 AM this morning.  She was later found by spouse this afternoon unresponsive prompting EMS call.  Of note patient large volume of hydromorphone tablets (27 tabs missing in 3 dys).  Prolonged arrest in field with 3 arrest on arrival to ED.  ? ?Assessment and Plan: ?Out of hospital cardiac arrest, stress cardiomyopathy: Improving function by repeat echo 5/12.  ?- Continue oral lasix for now, BMP stable. CO2 rising suggestive of contraction alkalosis though Cr, BUN wnl.  ?- Continue ARB (was on PTA), may titrate upward. ?- Started low dose metoprolol, continue monitoring. ? ?Amnesia, anoxic encephalopathy: Improving ? ?Aspiration pneumonitis, acute hypoxemic respiratory failure: recurrent 5/9 suspicion for volume overloaded has again resolved. Completed 5 days ceftriaxone, cultures negative. ?- Continuing lasix for now, though suspect her nocturnal hypoxia is from OSA. Recommend sleep study after discharge.  ? ?Fall with right face laceration: CT neck/head benign. Healing as anticipated.  ? ?Hx of Pseudotumor cerebri: Quiescent ? ?History of Schizoaffective disorder, suspect suicide attempt with ongoing depression:  ?- Inpatient psychiatric hospitalization recommended at this time. Currently not IVC. 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  ?- Continue seroquel 200mg  qHS (was on 100mg  TID PTA) ?- Continue remeron 15mg  qHS ?- Continue clonazepam 1mg  BID (was 1mg  TID PTA) ?- Continue cymbalta 60mg  daily  ? ?Vitamin B12 deficiency with macrocytic anemia:  ?- Supplement initiated by IM,  transition to po.  ? ?Elevated TSH: Very mild, in severely ill setting.  ?- Recheck at follow up. ? ?AKI due to ATN: Resolved, had a day of 8L diuresis earlier in the hospitalization. ?   ?Obesity: Estimated body mass index is 48.97 kg/m? as calculated from the following: ?  Height as of this encounter: 5\' 4"  (1.626 m). ?  Weight as of this encounter: 129.4 kg. ? ?Subjective: Getting up more, now able to ambulate independently with walker to bathroom. No chest pain or dyspnea currently. Continues to look forward to getting stronger and better enough to return home for her grandchild's visit next months and her son's wedding in August.  ? ?Objective: ?Vitals:  ? 08/10/21 2003 08/11/21 0020 08/11/21 0431 08/11/21 0749  ?BP: 116/61 106/72 99/79 127/82  ?Pulse: (!) 108 99 100 100  ?Resp:    18  ?Temp: 97.6 ?F (36.4 ?C) 98.1 ?F (36.7 ?C) 98.1 ?F (36.7 ?C) 98 ?F (36.7 ?C)  ?TempSrc: Oral Oral Oral Oral  ?SpO2: 97% 99% 100% 100%  ?Weight:   129.4 kg   ?Height:      ?Gen: 46 y.o. female in no distress ?Pulm: Nonlabored breathing room air. Clear. ?CV: Regular rate and rhythm. No murmur, rub, or gallop. No JVD, no pitting dependent edema. ?GI: Abdomen soft, non-tender, non-distended, with normoactive bowel sounds.  ?Ext: Warm, no deformities ?Skin: No new rashes, lesions or ulcers on visualized skin. ?Neuro: Alert and oriented. No focal neurological deficits. ?Psych: Judgement and insight appear fair. Behavior is appropriate.   ? ?Data Personally reviewed: ?CBC: ?Recent Labs  ?Lab 08/05/21 ?0412 08/05/21 ?[id number] 08/05/21 ?1105 08/06/21 ?0352 08/07/21 ?[id number] 08/08/21 ?1301  ?WBC 9.7  --   --  7.0 7.9  8.4  ?HGB 10.9* 10.9* 10.5* 10.5* 11.6* 13.1  ?HCT 34.8* 32.0* 31.0* 32.7* 35.3* 41.2  ?MCV 100.3*  --   --  97.3 94.9 97.6  ?PLT 141*  --   --  133* 171 207  ? ?Basic Metabolic Panel: ?Recent Labs  ?Lab 08/05/21 ?0412 08/05/21 ?[id number] 08/06/21 ?0352 08/07/21 ?[id number] 08/08/21 ?[id number] 08/09/21 ?[id number] 08/10/21 ?[id number] 08/11/21 ?0249  ?NA 142    < > 144 143 140 140 138 141  ?K 5.2*   < > 3.5 3.3* 4.6 4.3 4.7 4.0  ?CL 113*  --  107 104 101 102 100 100  ?CO2 21*  --  30 28 29 30 29  33*  ?GLUCOSE 109*  --  133* 83 95 106* 100* 108*  ?BUN 22*  --  24* 20 14 13 13 10   ?CREATININE 1.83*  --  1.24* 1.03* 0.85 0.87 0.87 0.93  ?CALCIUM 9.0  --  8.5* 8.6* 8.9 9.3 9.2 9.2  ?MG 2.1  --  1.9 1.7 2.5*  --   --   --   ?PHOS 3.9  --  4.1 3.3 4.6  --   --   --   ? < > = values in this interval not displayed.  ? ?CBG: ?Recent Labs  ?Lab 08/06/21 ?1158 08/06/21 ?1202 08/06/21 ?1543 08/06/21 ?2044 08/07/21 ?0030  ?GLUCAP 49* 102* 95 102* 98  ? ?Lipid Profile: ?No results for input(s): CHOL, HDL, LDLCALC, TRIG, CHOLHDL, LDLDIRECT in the last 72 hours. ? ?Thyroid Function Tests: ?Recent Labs  ?  08/08/21 ?1301  ?TSH 4.636*  ? ?Anemia Panel: ?Recent Labs  ?  08/08/21 ?1301  ?VITAMINB12 157*  ? ?ECHOCARDIOGRAM LIMITED 08/08/2021 LV and RV function have significantly improved from prior echo. EF approximately 50-55%. FINDINGS  Left Ventricle: LV and RV function have significantly improved from prior echo. EF approximately 50-55%.   ?  ?Patrecia Pour, MD ?08/11/2021 11:26 AM ?Page by [email or web address]  ?

## 2021-08-11 NOTE — Progress Notes (Signed)
Nutrition Follow-up ? ?DOCUMENTATION CODES:  ? ?Morbid obesity ? ?INTERVENTION:  ? ?Ensure Enlive po TID, each supplement provides 350 kcal and 20 grams of protein. ? ?MVI po daily  ? ?NUTRITION DIAGNOSIS:  ? ?Inadequate oral intake related to acute illness as evidenced by NPO status. ?-resolved  ? ?GOAL:  ? ?Patient will meet greater than or equal to 90% of their needs ?-progressing  ? ?MONITOR:  ? ?PO intake, Supplement acceptance, Labs, Weight trends, Skin, I & O's ? ?ASSESSMENT:  ? ?46 y/o female with h/o depression, anxiety, SI, PTSD, schizoaffective disorder, opiod use, HTN and pseudotumor cerebri who is admitted with cardiac arrest. ? ?RD working remotely. ? ?Unable to reach pt by phone. Pt extubated 5/10 and initiated on a regular diet. Pt is documented to be eating 75-100% of meals; pt ate 100% of her breakfast this morning. RD will add supplements and MVI to help pt meet her estimated needs. Per chart, pt is down 12lbs(4%) since admit; pt -6.5L on her I & Os. RD will obtain nutrition related history and exam at follow-up.  ? ?Medications reviewed and include: lasix, heparin, remeron, protonix, KCl, B12 ? ?Labs reviewed: K 4.0 wnl ?P 4.6 wnl, Mg 2.5(H)- 5/12 ? ?NUTRITION - FOCUSED PHYSICAL EXAM: ?Unable to perform at this time  ? ?Diet Order:   ?Diet Order   ? ?       ?  Diet regular Room service appropriate? Yes; Fluid consistency: Thin  Diet effective now       ?  ? ?  ?  ? ?  ? ?EDUCATION NEEDS:  ? ?Not appropriate for education at this time ? ?Skin:  Skin Assessment: Reviewed RN Assessment (right face laceration, skin tear sternum) ? ?Last BM:  5/12- type 6 ? ?Height:  ? ?Ht Readings from Last 1 Encounters:  ?08/05/21 5\' 4"  (1.626 m)  ? ? ?Weight:  ? ?Wt Readings from Last 1 Encounters:  ?08/11/21 129.4 kg  ? ?BMI:  Body mass index is 48.97 kg/m?. ? ?Estimated Nutritional Needs:  ? ?Kcal:  2200-2500kcal/day ? ?Protein:  110-125g/day ? ?Fluid:  1.7-1.9L/day ? ?Koleen Distance MS, RD, LDN ?Please refer to  [facility] for RD and/or RD on-call/weekend/after hours pager ? ?

## 2021-08-12 ENCOUNTER — Inpatient Hospital Stay (HOSPITAL_COMMUNITY): Payer: BC Managed Care – PPO

## 2021-08-12 DIAGNOSIS — F251 Schizoaffective disorder, depressive type: Secondary | ICD-10-CM | POA: Diagnosis not present

## 2021-08-12 DIAGNOSIS — I469 Cardiac arrest, cause unspecified: Secondary | ICD-10-CM | POA: Diagnosis not present

## 2021-08-12 LAB — BASIC METABOLIC PANEL
Anion gap: 10 (ref 5–15)
BUN: 10 mg/dL (ref 6–20)
CO2: 25 mmol/L (ref 22–32)
Calcium: 8.9 mg/dL (ref 8.9–10.3)
Chloride: 104 mmol/L (ref 98–111)
Creatinine, Ser: 0.75 mg/dL (ref 0.44–1.00)
GFR, Estimated: >60 mL/min (ref >60)
Glucose, Bld: 103 mg/dL — ABNORMAL HIGH (ref 70–99)
Potassium: 4 mmol/L (ref 3.5–5.1)
Sodium: 139 mmol/L (ref 135–145)

## 2021-08-12 MED ORDER — KETOROLAC TROMETHAMINE 15 MG/ML IJ SOLN
15.0000 mg | Freq: Four times a day (QID) | INTRAMUSCULAR | Status: AC
  Administered 2021-08-12 – 2021-08-13 (×3): 15 mg via INTRAVENOUS
  Filled 2021-08-12 (×3): qty 1

## 2021-08-12 NOTE — Progress Notes (Signed)
Physical Therapy Treatment ?Patient Details ?Name: Audrey [name] ?MRN: [id number] ?DOB: 05/02/1975 ?Today's Date: 08/12/2021 ? ? ?History of Present Illness 46 y.o. female admitted 08/02/21 with OOH cardiac arrest. CPR performed for ~30 min. Per chart, pt with multiple missing home opiate meds, suspicious for possible unintentional vs intentional OD as cause of cardiac arrest. ETT (5/6 - 5/8, 59-5/10). Pt also with aspiration pneuomonitis. CT of head and neck unremarkable. PMH: anxiety, arthritis, depression, heart murmur, pseudotumor cerebri, PTSD, schizo affective schizophrenia, sciatica ? ?  ?PT Comments  ? ? Pt with significant progression in mobility and function this session. Pt able to walk in hall without AD and perform HEP. Pt with right lateral chest pain not at Latissimus insertion with pain on shoulder flexion without restricted AAROM. Pt educated for HEP, walking program and progression. D/C plan updated. ? ?Max HR 148 with back pain, 115 at rest ?   ?Recommendations for follow up therapy are one component of a multi-disciplinary discharge planning process, led by the attending physician.  Recommendations may be updated based on patient status, additional functional criteria and insurance authorization. ? ?Follow Up Recommendations ? Outpatient PT ?  ?  ?Assistance Recommended at Discharge Intermittent Supervision/Assistance  ?Patient can return home with the following Assistance with cooking/housework;Direct supervision/assist for medications management;Assist for transportation;A little help with walking and/or transfers;A little help with bathing/dressing/bathroom ?  ?Equipment Recommendations ? Rollator (4 wheels)  ?  ?Recommendations for Other Services   ? ? ?  ?Precautions / Restrictions Precautions ?Precautions: Fall ?Precaution Comments: watch HR  ?  ? ?Mobility ? Bed Mobility ?Overal bed mobility: Needs Assistance ?Bed Mobility: Rolling, Sidelying to Sit ?Rolling: Supervision ?Sidelying to  sit: Min assist ?  ?  ?  ?General bed mobility comments: min assist to rise from right side limited by pain with HOB 20 degrees ?  ? ?Transfers ?Overall transfer level: Modified independent ?  ?  ?  ?  ?  ?  ?  ?  ?  ?  ? ?Ambulation/Gait ?Ambulation/Gait assistance: Supervision ?Gait Distance (Feet): 150 Feet ?Assistive device: None ?Gait Pattern/deviations: Step-through pattern, Decreased stride length ?  ?Gait velocity interpretation: 1.31 - 2.62 ft/sec, indicative of limited community ambulator ?  ?General Gait Details: pt with good posture and stride length with HR 135 during gait and standing pause to control HR. However after 62' pt with back spasm with HR up to 148 for return to room ? ? ?Stairs ?  ?  ?  ?  ?  ? ? ?Wheelchair Mobility ?  ? ?Modified Rankin (Stroke Patients Only) ?  ? ? ?  ?Balance Overall balance assessment: Mild deficits observed, not formally tested ?  ?  ?  ?  ?  ?  ?  ?  ?  ?  ?  ?  ?  ?  ?  ?  ?  ?  ?  ? ?  ?Cognition Arousal/Alertness: Awake/alert ?Behavior During Therapy: [facility] for tasks assessed/performed ?Overall Cognitive Status: Within Functional Limits for tasks assessed ?  ?  ?  ?  ?  ?  ?  ?  ?  ?  ?  ?  ?  ?  ?  ?  ?  ?  ?  ? ?  ?Exercises General Exercises - Upper Extremity ?Shoulder Flexion: AAROM, Right, Supine, 5 reps ?General Exercises - Lower Extremity ?Long Arc Quad: AROM, Both, Seated, 20 reps ?Hip Flexion/Marching: AROM, Both, Seated, 10 reps ? ?  ?General Comments   ?  ?  ? ?  Pertinent Vitals/Pain Pain Assessment ?Pain Score: 6  ?Pain Location: right lateral chest around rib 10 ?Pain Descriptors / Indicators: Aching ?Pain Intervention(s): Limited activity within patient's tolerance, Monitored during session, Premedicated before session, Repositioned  ? ? ?Home Living   ?  ?  ?  ?  ?  ?  ?  ?  ?  ?   ?  ?Prior Function    ?  ?  ?   ? ?PT Goals (current goals can now be found in the care plan section) Progress towards PT goals: Progressing toward goals ? ?   ?Frequency ? ? ? Min 3X/week ? ? ? ?  ?PT Plan Discharge plan needs to be updated  ? ? ?Co-evaluation   ?  ?  ?  ?  ? ?  ?AM-PAC PT "6 Clicks" Mobility   ?Outcome Measure ? Help needed turning from your back to your side while in a flat bed without using bedrails?: A Little ?Help needed moving from lying on your back to sitting on the side of a flat bed without using bedrails?: A Little ?Help needed moving to and from a bed to a chair (including a wheelchair)?: A Little ?Help needed standing up from a chair using your arms (e.g., wheelchair or bedside chair)?: A Little ?Help needed to walk in hospital room?: A Little ?Help needed climbing 3-5 steps with a railing? : A Little ?6 Click Score: 18 ? ?  ?End of Session   ?Activity Tolerance: Patient tolerated treatment well ?Patient left: in chair;with call bell/phone within reach;with nursing/sitter in room ?Nurse Communication: Mobility status ?PT Visit Diagnosis: Other abnormalities of gait and mobility (R26.89);Muscle weakness (generalized) (M62.81);Difficulty in walking, not elsewhere classified (R26.2) ?  ? ? ?Time: 2409-7353 ?PT Time Calculation (min) (ACUTE ONLY): 24 min ? ?Charges:  $Gait Training: 8-22 mins ?$Therapeutic Activity: 8-22 mins          ?          ? ?Ranald Alessio P, PT ?Acute Rehabilitation Services ?Pager: [phone] ?Office: [phone] ? ? ? ?Vasco Chong B Netanya Yazdani ?08/12/2021, 9:56 AM ? ?

## 2021-08-12 NOTE — Progress Notes (Addendum)
CSW acknowledges consult for inpatient psych. voluntary when patient medically stable for dc. CSW following to complete consult/referral to [facility] if still recommended when patient medically stable for dc. CSW will continue to follow. ?

## 2021-08-12 NOTE — Progress Notes (Signed)
PT Cancellation Note ? ?Patient Details ?Name: Audrey [name] ?MRN: [id number] ?DOB: 24-Mar-1976 ? ? ?Cancelled Treatment:    Reason Eval/Treat Not Completed: Patient at procedure or test/unavailable ? ? ?Audrey [name] ?08/12/2021, 7:39 AM ?Kirti Carl P, PT ?Acute Rehabilitation Services ?Pager: [phone] ?Office: [phone] ? ?

## 2021-08-12 NOTE — Progress Notes (Signed)
Patient tachycardic with ambulation, not a change and physician notified. Only tachycardic with activity.  ?

## 2021-08-12 NOTE — Plan of Care (Signed)

## 2021-08-12 NOTE — Progress Notes (Signed)
?Progress Note ? ?Patient: Audrey [name] BPZ:[id number] DOB: [birth year]  ?DOA: 08/02/2021  DOS: 08/12/2021  ?  ?Brief hospital course: ?Audrey [name] is a 46 year old female with a past medical history significant for Pseudotumor cerebri, anxiety, depression, PTSD,, schizoaffective disorder, and chronic opioid use who presented to the ED after suffering outside hospital cardiac arrest.  Per EMS patient was last seen normal per spouse at 4 AM this morning.  She was later found by spouse this afternoon unresponsive prompting EMS call.  Of note patient large volume of hydromorphone tablets (27 tabs missing in 3 dys).  Prolonged arrest in field with 3 arrest on arrival to ED.  ? ?Assessment and Plan: ?Out of hospital cardiac arrest, stress cardiomyopathy: Improving function by repeat echo 5/12.  ?- Continue ARB (was on PTA), may titrate upward. ?- Started low dose metoprolol, continue monitoring. ? ?Amnesia, anoxic encephalopathy: Improving ? ?Aspiration pneumonitis, acute hypoxemic respiratory failure: recurrent 5/9 suspicion for volume overloaded has again resolved. Completed 5 days ceftriaxone, cultures negative. ?- No crackles or pulmonary edema on CXR, stop lasix which may help with exertional sinus tachycardia.  ?- Her nocturnal hypoxia is from OSA. Recommend sleep study after discharge.  ? ?Fall with right face laceration: CT neck/head benign. Healing as anticipated.  ? ?Deconditioning: Requires physical therapy/rehabilitation. This is overall improving and PT is working diligently with the patient to mobilize frequently to facilitate discharge to inpatient psych (which will require total independence). Per my discussion with PT, this may be expected 5/17.  ? ?Hx of Pseudotumor cerebri: Quiescent ? ?History of Schizoaffective disorder, suspect suicide attempt with ongoing depression:  ?- Inpatient psychiatric hospitalization recommended at this time. Currently not IVC. Continue Recruitment consultant.  ?-  Continue seroquel 200mg  qHS (was on 100mg  TID PTA) ?- Continue remeron 15mg  qHS ?- Continue clonazepam 1mg  BID (was 1mg  TID PTA) ?- Continue cymbalta 60mg  daily  ? ?Vitamin B12 deficiency with macrocytic anemia:  ?- Supplement initiated by IM, transition to po.  ? ?Elevated TSH: Very mild, in severely ill setting.  ?- Recheck at follow up. ? ?AKI due to ATN: Resolved, had a day of 8L diuresis earlier in the hospitalization. ? ?Right chest wall pain: No cutaneous findings on exam which is most consistent with hyperalgesia without regard to specific muscle distribution or specific muscle group. No evidence of zoster, cellulitis. No fractures on CXR and no precipitating event. No exertional component or pleuritic qualities.  ?- Heating pad ?- No augmentation to dilaudid is planned, will add trial of toradol x3 doses and monitor.  ?- Reassurance provided. ?   ?Obesity: Estimated body mass index is 48.66 kg/m? as calculated from the following: ?  Height as of this encounter: 5\' 4"  (1.626 m). ?  Weight as of this encounter: 128.6 kg. ? ?Subjective: Noted pain under the right arm today, was sore yesterday but 10/10 pain which causes her to be unable to raise her arm. She had to move her right arm with her left arm for CXR this morning. No trauma to the area, no history of this, is present at rest, not worse with breathing. Much worse with any palpation at all. No other complaints, is getting up more regularly. Midchest pain is improving slowly. ? ?Objective: ?Vitals:  ? 08/11/21 2024 08/12/21 0436 08/12/21 0807 08/12/21 0952  ?BP:  (!) 101/58 111/88   ?Pulse: 100 97 93 (!) 125  ?Resp: 20 17 17    ?Temp:  97.9 ?F (36.6 ?C) (!) 97.3 ?F (36.3 ?C)   ?  TempSrc:  Oral Oral   ?SpO2: 94% 94% 96%   ?Weight:  128.6 kg    ?Height:      ?Gen: 46 y.o. female in no distress ?Pulm: Nonlabored breathing room air. Clear. ?CV: NSR, rate in 100's on monitor having just ambulated. No murmur, rub, or gallop. No JVD, no pitting dependent  edema. ?GI: Abdomen soft, non-tender, non-distended, with normoactive bowel sounds.  ?Right chest wall visualized and palpated with sitter present throughout. No rash or erythema or induration or fluctuance. There is significant tenderness to palpation in response to light touch of area inferolateral to right breast without palpable deformity. No regional lymphadenopathy. Pain worse when pt attempts to abduct shoulder and at the end of the exam she is keeping her right arm lifted stating she can't lower her arm herself due to pain. ?Skin: No rashes, lesions or ulcers on visualized skin. ?Neuro: Alert and oriented. No focal neurological deficits. ?Psych: Judgement and insight appear fair. ? ?Data Personally reviewed: ?CBC: ?Recent Labs  ?Lab 08/05/21 ?1105 08/06/21 ?0352 08/07/21 ?4656 08/08/21 ?1301  ?WBC  --  7.0 7.9 8.4  ?HGB 10.5* 10.5* 11.6* 13.1  ?HCT 31.0* 32.7* 35.3* 41.2  ?MCV  --  97.3 94.9 97.6  ?PLT  --  133* 171 207  ? ?Basic Metabolic Panel: ?Recent Labs  ?Lab 08/06/21 ?0352 08/07/21 ?8127 08/08/21 ?5170 08/09/21 ?0174 08/10/21 ?9449 08/11/21 ?6759 08/12/21 ?1638  ?NA 144 143 140 140 138 141 139  ?K 3.5 3.3* 4.6 4.3 4.7 4.0 4.0  ?CL 107 104 101 102 100 100 104  ?CO2 30 28 29 30 29  33* 25  ?GLUCOSE 133* 83 95 106* 100* 108* 103*  ?BUN 24* 20 14 13 13 10 10   ?CREATININE 1.24* 1.03* 0.85 0.87 0.87 0.93 0.75  ?CALCIUM 8.5* 8.6* 8.9 9.3 9.2 9.2 8.9  ?MG 1.9 1.7 2.5*  --   --   --   --   ?PHOS 4.1 3.3 4.6  --   --   --   --   ? ?ECHOCARDIOGRAM LIMITED 08/08/2021 LV and RV function have significantly improved from prior echo. EF approximately 50-55%. FINDINGS  Left Ventricle: LV and RV function have significantly improved from prior echo. EF approximately 50-55%.   ?  ? , MD ?08/12/2021 10:51 AM ?Page by [email or web address]  ?

## 2021-08-12 NOTE — Progress Notes (Signed)
Patient complained of severe 10/10 pain when lifting or moving right arm. Pain is axillary and under right breast, sharp and only present with movement. No pain with arm at side and not moving. Noted pain when in radiology getting xray. Continuing now. Dr. Jarvis Newcomer notified. ?

## 2021-08-12 NOTE — Consult Note (Signed)
[facility] Follow[facility] Face-to-Face Psychiatric Evaluation ? ? ?Service Date: Aug 12, 2021 ?LOS:  LOS: 10 days  ? ? ?Assessment  ?Audrey [name] is a 46 y.o. female admitted medically for 08/02/2021  6:21 PM for treatment of cardiac arrest likely 2/2 medication overdose. She carries the psychiatric diagnoses of anxiety, depression, PTSD, and schizoaffective disorder. and has a past medical history of  arthritis, heart murmur, Pseudotumor cerebri, and sciatica. Psychiatry was consulted for suicide attempt by primary provider.  ? ?Her initial presentation of cardiac arrest in the setting of numerous missing pillsis most consistent with unsuccessful suicide attempt in the setting of known depression. She was previously diagnosed with anxiety, depression, and schizoaffective disorder. Her presentation during interview was consistent with worsening depressive disorder as evidenced by declining interest in things she used to enjoy, continued sleep disturbances, minimal appetite, and new hallucinations since the last year. She reported that she had been doing fine but started to notice a decline in these areas over the last few weeks leading up to her retrograde amnesia immediately leading up to her hospitalization.  ? ?Current outpatient psychotropic medications included clonazepam, duloxetine, gabapentin, mirtazapine, and quetiapine and historically she has had an poor response to these medications. She was compliant with medications prior to admission. Please see plan below for detailed recommendations.  ? ?On follow up reassessment, patient states " I still dont remember what happened 24 hours prior to coming to the hospital.  That is the part that scares me.  I would hate to go home and realized that it was an actual attempt.  I would like to go inpatient just to be on the safe side."  Patient is alert and oriented x4, calm and cooperative, very engaging and pleasant on reassessment.  Patient  continues to endorse visual hallucinations upon waking up" usually within the first 20 minutes", they are the same hallucinations that I see each time people standing in the doorway, sometimes they talk to me and can encourage me to do certain things.  I wonder if they tell me to take the additional medication and I just do not remember."  Patient endorses prior to starting psychotropic medication, her visual hallucinations were 100s of spiders falling from the ceiling.  Patient denies any hallucinations during this hospitalization.  She endorses improvement in her mood, and very appreciative for the life-sustaining measures that took place."  I am glad to be alive.  I understand his pain and recovery Audrey take some time after having CPR, but very thankful."  She endorses fair sleep and good appetite.  She denies any side effects and/or adverse reactions from psychotropic medication.  Currently denies any suicidal ideations, homicidal ideations, and or auditory or visual hallucinations.  At present she remains voluntarily, and wishes to pursue inpatient psychiatric hospitalization for medication management, crisis stabilization, and additional coping skills for hallucinations.  Audrey refer to inpatient psychiatric hospitalization once medically stable. ? ?Diagnoses:  ?Active Hospital problems: ?Principal Problem: ?  Cardiac arrest [facility](HCC) ?Active Problems: ?  Goals of care, counseling/discussion ?  Schizoaffective disorder, depressive type (HCC) ?  ? ?Plan  ?## Safety and Observation Level:  ?- Based on my clinical evaluation, I estimate the patient to be at high risk of self harm in the current setting ?- At this time, we recommend a 1X1 level of observation. This decision is based on my review of the chart including patient's history and current presentation, interview of the patient, mental status examination, and consideration  of suicide risk including evaluating suicidal ideation, plan, intent, suicidal or  self-harm behaviors, risk factors, and protective factors. This judgment is based on our ability to directly address suicide risk, implement suicide prevention strategies and develop a safety plan while the patient is in the clinical setting. Please contact our team if there is a concern that risk level has changed. ? ? ?## Medications:  ?- continue daily dosage of Cymbalta to 60 mg (reduced from home dose of 180 mg) ?- continue Klonopin ay 1 mg BID (reduced from home dose of 1 mg TID) ?-Continue Seroquel to 200 mg QHS (takes 300 mg at home) ?- continue Remeron 15 mg at bedtime ? ? ?## Medical Decision Making Capacity:  ?Intact.  Patient is agreeable to psychiatric admission ? ?## Further Work-up:  ?-- TSH, B12 ? ? ?## Disposition:  ?-- recommend inpatient psychiatric placement ? ? ?##Legal Status ?-- Not currently under IVC and amenable to voluntary psychiatric placement ? ?Thank you for this consult request. Recommendations have been communicated to the primary team.  We Audrey continue following at this time.  ? ?Maryagnes Amos, FNP ? ?Total Time Spent in Direct Patient Care:  ?I personally spent 30 minutes on the unit in direct patient care. The direct patient care time included face-to-face time with the patient, reviewing the patient's chart, communicating with other professionals, and coordinating care. Greater than 50% of this time was spent in counseling or coordinating care with the patient regarding goals of hospitalization, psycho-education, and discharge planning needs. ? ? ? ?History  ?Relevant Aspects of Hospital Course:  ?Admitted on 08/02/2021 for cardiac arrest 2/2 medication overdose. Was treated appropriately and resuscitated.  ? ?Patient Report:  ?Patient seen in late afternoon. Husband remains at bedside per pt request. ? ?Endorses pain in chest. Has been working really hard in PT to hopefully get well enough for inpt psych treatment. Hopeful she Audrey improve and thankful she lived through  OD attempt. Discussed changes made to medications - pt and husband in agreement. No active SI, HI, AH/VH. Slept poorly -discussed inc quetiapine.  ? ? ?ROS:  ?Significant pain in chest ? ?Collateral information:  ?Frequent requests for pain meds per nursing staff ? ?Psychiatric History/social/family/substance/medical/surgical history:  ?See initial consult note. ? ?Medications:  ? ?Current Facility-Administered Medications:  ??  acetaminophen (TYLENOL) tablet 650 mg, 650 mg, Oral, Q4H PRN, 650 mg at 08/07/21 1008 **OR** acetaminophen (TYLENOL) 160 MG/5ML solution 650 mg, 650 mg, Per Tube, Q4H PRN **OR** acetaminophen (TYLENOL) suppository 650 mg, 650 mg, Rectal, Q4H PRN, Lorin Glass, MD ??  albuterol (PROVENTIL) (2.5 MG/3ML) 0.083% nebulizer solution 2.5 mg, 2.5 mg, Nebulization, Q2H PRN, Lorin Glass, MD, 2.5 mg at 08/05/21 0041 ??  clonazePAM (KLONOPIN) tablet 1 mg, 1 mg, Oral, BID, Cinderella, Margaret A, 1 mg at 08/12/21 0855 ??  docusate sodium (COLACE) capsule 100 mg, 100 mg, Oral, BID PRN, Paytes, Austin A, RPH ??  DULoxetine (CYMBALTA) DR capsule 60 mg, 60 mg, Oral, Daily, Cinderella, Margaret A, 60 mg at 08/12/21 0858 ??  feeding supplement (ENSURE ENLIVE / ENSURE PLUS) liquid 237 mL, 237 mL, Oral, TID BM, Hazeline Junker B, MD, 237 mL at 08/12/21 1100 ??  gabapentin (NEURONTIN) capsule 200 mg, 200 mg, Oral, TID, Paytes, Austin A, RPH, 200 mg at 08/12/21 0855 ??  heparin injection 5,000 Units, 5,000 Units, Subcutaneous, Q8H, Harris, Whitney D, NP, 5,000 Units at 08/12/21 0622 ??  HYDROmorphone (DILAUDID) tablet 1 mg, 1 mg, Oral, Q3H PRN,  Lorin Glass, MD, 1 mg at 08/12/21 0900 ??  ketorolac (TORADOL) 15 MG/ML injection 15 mg, 15 mg, Intravenous, Q6H, Hazeline Junker B, MD, 15 mg at 08/12/21 1110 ??  lip balm (CARMEX) ointment, , Topical, PRN, Lorin Glass, MD ??  losartan (COZAAR) tablet 25 mg, 25 mg, Oral, Daily, Jonita Albee, PA-C, 25 mg at 08/12/21 0855 ??  metoprolol succinate (TOPROL-XL) 24  hr tablet 12.5 mg, 12.5 mg, Oral, Daily, Hazeline Junker B, MD, 12.5 mg at 08/12/21 0857 ??  mirtazapine (REMERON) tablet 15 mg, 15 mg, Oral, QHS, Cinderella, Margaret A, 15 mg at 08/11/21 2143 ??  multivitamin with m

## 2021-08-13 ENCOUNTER — Inpatient Hospital Stay (HOSPITAL_COMMUNITY)
Admission: AD | Admit: 2021-08-13 | Discharge: 2021-08-16 | DRG: 885 | Disposition: A | Discharge status: Home / Self Care | Payer: BC Managed Care – PPO | Source: Intra-hospital | Attending: Psychiatry | Admitting: Psychiatry

## 2021-08-13 DIAGNOSIS — F332 Major depressive disorder, recurrent severe without psychotic features: Principal | ICD-10-CM | POA: Diagnosis present

## 2021-08-13 DIAGNOSIS — E538 Deficiency of other specified B group vitamins: Secondary | ICD-10-CM | POA: Diagnosis present

## 2021-08-13 DIAGNOSIS — F251 Schizoaffective disorder, depressive type: Secondary | ICD-10-CM | POA: Diagnosis present

## 2021-08-13 DIAGNOSIS — F419 Anxiety disorder, unspecified: Secondary | ICD-10-CM | POA: Diagnosis not present

## 2021-08-13 DIAGNOSIS — I10 Essential (primary) hypertension: Secondary | ICD-10-CM | POA: Diagnosis present

## 2021-08-13 DIAGNOSIS — Z9151 Personal history of suicidal behavior: Secondary | ICD-10-CM | POA: Diagnosis not present

## 2021-08-13 DIAGNOSIS — T402X2A Poisoning by other opioids, intentional self-harm, initial encounter: Secondary | ICD-10-CM | POA: Diagnosis present

## 2021-08-13 DIAGNOSIS — J45909 Unspecified asthma, uncomplicated: Secondary | ICD-10-CM | POA: Diagnosis present

## 2021-08-13 DIAGNOSIS — Z9152 Personal history of nonsuicidal self-harm: Secondary | ICD-10-CM

## 2021-08-13 DIAGNOSIS — Z20822 Contact with and (suspected) exposure to covid-19: Secondary | ICD-10-CM | POA: Diagnosis present

## 2021-08-13 DIAGNOSIS — G894 Chronic pain syndrome: Secondary | ICD-10-CM | POA: Diagnosis present

## 2021-08-13 DIAGNOSIS — K5900 Constipation, unspecified: Secondary | ICD-10-CM | POA: Diagnosis present

## 2021-08-13 DIAGNOSIS — R12 Heartburn: Secondary | ICD-10-CM | POA: Diagnosis present

## 2021-08-13 DIAGNOSIS — Z880 Allergy status to penicillin: Secondary | ICD-10-CM

## 2021-08-13 DIAGNOSIS — Z818 Family history of other mental and behavioral disorders: Secondary | ICD-10-CM | POA: Diagnosis not present

## 2021-08-13 DIAGNOSIS — E559 Vitamin D deficiency, unspecified: Secondary | ICD-10-CM | POA: Diagnosis present

## 2021-08-13 DIAGNOSIS — F259 Schizoaffective disorder, unspecified: Secondary | ICD-10-CM | POA: Diagnosis present

## 2021-08-13 DIAGNOSIS — F4310 Post-traumatic stress disorder, unspecified: Secondary | ICD-10-CM | POA: Diagnosis present

## 2021-08-13 DIAGNOSIS — I469 Cardiac arrest, cause unspecified: Secondary | ICD-10-CM | POA: Diagnosis not present

## 2021-08-13 DIAGNOSIS — Z79899 Other long term (current) drug therapy: Secondary | ICD-10-CM | POA: Diagnosis not present

## 2021-08-13 LAB — RESP PANEL BY RT-PCR (FLU A&B, COVID) ARPGX2
Influenza A by PCR: NEGATIVE
Influenza B by PCR: NEGATIVE
SARS Coronavirus 2 by RT PCR: NEGATIVE

## 2021-08-13 MED ORDER — HYDROMORPHONE HCL 2 MG PO TABS
1.0000 mg | ORAL_TABLET | ORAL | Status: DC | PRN
  Administered 2021-08-13: 1 mg via ORAL
  Filled 2021-08-13: qty 1

## 2021-08-13 MED ORDER — LOSARTAN POTASSIUM 25 MG PO TABS
25.0000 mg | ORAL_TABLET | Freq: Every day | ORAL | Status: DC
  Administered 2021-08-14 – 2021-08-16 (×3): 25 mg via ORAL
  Filled 2021-08-13 (×4): qty 1

## 2021-08-13 MED ORDER — DULOXETINE HCL 60 MG PO CPEP
60.0000 mg | ORAL_CAPSULE | Freq: Every day | ORAL | Status: DC
  Administered 2021-08-14 – 2021-08-16 (×3): 60 mg via ORAL
  Filled 2021-08-13 (×5): qty 1

## 2021-08-13 MED ORDER — TIZANIDINE HCL 2 MG PO TABS: 2.0000 mg | ORAL_TABLET | Freq: Two times a day (BID) | ORAL | 0 refills | Status: DC

## 2021-08-13 MED ORDER — ACETAMINOPHEN 325 MG PO TABS
650.0000 mg | ORAL_TABLET | Freq: Four times a day (QID) | ORAL | Status: DC | PRN
  Administered 2021-08-14 – 2021-08-15 (×2): 650 mg via ORAL
  Filled 2021-08-13 (×2): qty 2

## 2021-08-13 MED ORDER — LOSARTAN POTASSIUM 25 MG PO TABS: 25.0000 mg | ORAL_TABLET | Freq: Every day | ORAL | 0 refills | Status: DC

## 2021-08-13 MED ORDER — GABAPENTIN 100 MG PO CAPS
200.0000 mg | ORAL_CAPSULE | Freq: Three times a day (TID) | ORAL | Status: DC
  Administered 2021-08-14 – 2021-08-16 (×7): 200 mg via ORAL
  Filled 2021-08-13 (×12): qty 2

## 2021-08-13 MED ORDER — HYDROMORPHONE HCL 2 MG PO TABS: 1.0000 mg | ORAL_TABLET | Freq: Three times a day (TID) | ORAL | 0 refills | Status: DC

## 2021-08-13 MED ORDER — MIRTAZAPINE 15 MG PO TABS
15.0000 mg | ORAL_TABLET | Freq: Every day | ORAL | Status: DC
Start: 2021-08-13 — End: 2021-08-14
  Administered 2021-08-13: 15 mg via ORAL
  Filled 2021-08-13 (×3): qty 1

## 2021-08-13 MED ORDER — DULOXETINE HCL 60 MG PO CPEP
60.0000 mg | ORAL_CAPSULE | Freq: Every day | ORAL | 0 refills | Status: DC
Start: 2021-08-14 — End: 2021-08-16

## 2021-08-13 MED ORDER — ALUM & MAG HYDROXIDE-SIMETH 200-200-20 MG/5ML PO SUSP: 30.0000 mL | ORAL | Status: DC | PRN

## 2021-08-13 MED ORDER — MUPIROCIN 2 % EX OINT: TOPICAL_OINTMENT | Freq: Two times a day (BID) | CUTANEOUS | 0 refills | Status: DC

## 2021-08-13 MED ORDER — TIZANIDINE HCL 2 MG PO TABS
2.0000 mg | ORAL_TABLET | Freq: Two times a day (BID) | ORAL | Status: DC
  Administered 2021-08-14 – 2021-08-16 (×5): 2 mg via ORAL
  Filled 2021-08-13 (×10): qty 1

## 2021-08-13 MED ORDER — CYANOCOBALAMIN 1000 MCG PO TABS: 1000.0000 ug | ORAL_TABLET | Freq: Every day | ORAL | 0 refills | Status: DC

## 2021-08-13 MED ORDER — PANTOPRAZOLE SODIUM 40 MG PO TBEC
40.0000 mg | DELAYED_RELEASE_TABLET | Freq: Every day | ORAL | Status: DC
  Administered 2021-08-14 – 2021-08-16 (×3): 40 mg via ORAL
  Filled 2021-08-13 (×5): qty 1

## 2021-08-13 MED ORDER — POTASSIUM CHLORIDE CRYS ER 20 MEQ PO TBCR: 20.0000 meq | EXTENDED_RELEASE_TABLET | Freq: Two times a day (BID) | ORAL | 0 refills | Status: DC

## 2021-08-13 MED ORDER — MAGNESIUM HYDROXIDE 400 MG/5ML PO SUSP: 30.0000 mL | Freq: Every day | ORAL | Status: DC | PRN

## 2021-08-13 MED ORDER — QUETIAPINE FUMARATE 200 MG PO TABS
200.0000 mg | ORAL_TABLET | Freq: Every day | ORAL | Status: DC
  Administered 2021-08-13 – 2021-08-15 (×3): 200 mg via ORAL
  Filled 2021-08-13 (×6): qty 1

## 2021-08-13 MED ORDER — ADULT MULTIVITAMIN W/MINERALS CH
1.0000 | ORAL_TABLET | Freq: Every day | ORAL | Status: DC
  Administered 2021-08-14 – 2021-08-16 (×3): 1 via ORAL
  Filled 2021-08-13 (×5): qty 1

## 2021-08-13 MED ORDER — QUETIAPINE FUMARATE 200 MG PO TABS: 200.0000 mg | ORAL_TABLET | Freq: Every day | ORAL | 0 refills | Status: DC

## 2021-08-13 MED ORDER — ACETAMINOPHEN 325 MG PO TABS: 650.0000 mg | ORAL_TABLET | ORAL | 0 refills | Status: AC | PRN

## 2021-08-13 MED ORDER — CLONAZEPAM 1 MG PO TABS
1.0000 mg | ORAL_TABLET | Freq: Two times a day (BID) | ORAL | Status: DC
  Administered 2021-08-13 – 2021-08-14 (×2): 1 mg via ORAL
  Filled 2021-08-13 (×3): qty 1

## 2021-08-13 MED ORDER — GABAPENTIN 50 MG PO TABS
200.0000 mg | ORAL_TABLET | Freq: Three times a day (TID) | ORAL | 0 refills | Status: DC
Start: 2021-08-13 — End: 2021-12-06

## 2021-08-13 MED ORDER — DOCUSATE SODIUM 100 MG PO CAPS: 100.0000 mg | ORAL_CAPSULE | Freq: Two times a day (BID) | ORAL | Status: DC | PRN

## 2021-08-13 MED ORDER — ADULT MULTIVITAMIN W/MINERALS CH: 1.0000 | ORAL_TABLET | Freq: Every day | ORAL | 0 refills | Status: DC

## 2021-08-13 MED ORDER — PANTOPRAZOLE SODIUM 40 MG PO TBEC: 40.0000 mg | DELAYED_RELEASE_TABLET | Freq: Every day | ORAL | 0 refills | Status: DC

## 2021-08-13 MED ORDER — MIRTAZAPINE 15 MG PO TABS: 15.0000 mg | ORAL_TABLET | Freq: Every day | ORAL | 0 refills | Status: DC

## 2021-08-13 MED ORDER — METOPROLOL SUCCINATE ER 25 MG PO TB24: 12.5000 mg | ORAL_TABLET | Freq: Every day | ORAL | 0 refills | Status: DC

## 2021-08-13 MED ORDER — POTASSIUM CHLORIDE CRYS ER 20 MEQ PO TBCR
20.0000 meq | EXTENDED_RELEASE_TABLET | Freq: Two times a day (BID) | ORAL | Status: DC
  Administered 2021-08-14 – 2021-08-15 (×4): 20 meq via ORAL
  Filled 2021-08-13 (×10): qty 1

## 2021-08-13 MED ORDER — VITAMIN B-12 1000 MCG PO TABS
1000.0000 ug | ORAL_TABLET | Freq: Every day | ORAL | Status: DC
  Administered 2021-08-14 – 2021-08-16 (×3): 1000 ug via ORAL
  Filled 2021-08-13 (×4): qty 1

## 2021-08-13 NOTE — Progress Notes (Signed)
Safe transport notified of need for transfer to [facility].  Stated they would leave at 2100 to come pick up pt.  Report given to Farina, RN at [facility].  Patient informed of the above.  Alonza Bogus ? ?

## 2021-08-13 NOTE — Consult Note (Signed)
Audrey [name] ? ? ?Service Date: Aug 13, 2021 ?LOS:  LOS: 11 days  ? ? ?Assessment  ?Audrey [name] is a 46 y.o. female admitted medically for 08/02/2021  6:21 PM for treatment of cardiac arrest likely 2/2 medication overdose. She carries the psychiatric diagnoses of anxiety, depression, PTSD, and schizoaffective disorder. and has a past medical history of  arthritis, heart murmur, Pseudotumor cerebri, and sciatica. Psychiatry was consulted for suicide attempt by primary provider.  ? ?Her initial presentation of cardiac arrest in the setting of numerous missing pillsis most consistent with unsuccessful suicide attempt in the setting of known depression. She was previously diagnosed with anxiety, depression, and schizoaffective disorder. Her presentation during interview was consistent with worsening depressive disorder as evidenced by declining interest in things she used to enjoy, continued sleep disturbances, minimal appetite, and new hallucinations since the last year. She reported that she had been doing fine but started to notice a decline in these areas over the last few weeks leading up to her retrograde amnesia immediately leading up to her hospitalization.  ? ?Current outpatient psychotropic medications included clonazepam, duloxetine, gabapentin, mirtazapine, and quetiapine and historically she has had an poor response to these medications. She was compliant with medications prior to admission. Please see plan below for detailed recommendations.  ? ?On todays re-[name] patient remains alert and oriented, calm and cooperative. She is observed to be sitting up in the chair, and watching television. She continues to meet inpatient criteria, and shows interest in wishing to go for stabilization for crisis, hallucinations, and possible suicide attempt.  ? ?She denies any side effects and/or adverse reactions from psychotropic medication.   Currently denies any suicidal ideations, homicidal ideations, and or auditory or visual hallucinations.  At present she remains voluntarily, and wishes to pursue inpatient psychiatric hospitalization for medication management, crisis stabilization, and additional coping skills for hallucinations.  Patient is now medically stable to discharge to inpatient psychiatric hospital. ? ?Diagnoses:  ?Active Hospital problems: ?Principal Problem: ?  Cardiac arrest [facility]) ?Active Problems: ?  Goals of care, counseling/discussion ?  Schizoaffective disorder, depressive type ([place]) ?  ? ?Plan  ?## Safety and Observation Level:  ?- Based on my clinical [name], I estimate the patient to be at high risk of self harm in the current setting ?- At this time, we recommend a 1X1 level of observation. This decision is based on my review of the chart including patient's history and current presentation, interview of the patient, mental status examination, and consideration of suicide risk including evaluating suicidal ideation, plan, intent, suicidal or self-harm behaviors, risk factors, and protective factors. This judgment is based on our ability to directly address suicide risk, implement suicide prevention strategies and develop a safety plan while the patient is in the clinical setting. Please contact our team if there is a concern that risk level has changed. ? ? ?## Medications:  ?- continue daily dosage of Cymbalta to 60 mg (reduced from home dose of 180 mg) ?- continue Klonopin ay 1 mg BID (reduced from home dose of 1 mg TID) ?-Continue Seroquel to 200 mg QHS (takes 300 mg at home) ?- continue Remeron 15 mg at bedtime ? ? ?## Medical Decision Making Capacity:  ?Intact. Patient is agreeable to psychiatric admission ? ?## Further Work-up:  ?-- TSH, B12 ? ? ?## Disposition:  ?-- recommend inpatient psychiatric placement ? ? ?##Legal Status ?-- Not currently under IVC and amenable to voluntary psychiatric placement ? ?  Thank you for  this consult request. Recommendations have been communicated to the primary team.  We will continue following at this time.  ? ?Suella Broad, FNP ? ?Total Time Spent in Direct Patient Care:  ?I personally spent 30 minutes on the unit in direct patient care. The direct patient care time included face-to-face time with the patient, reviewing the patient's chart, communicating with other professionals, and coordinating care. Greater than 50% of this time was spent in counseling or coordinating care with the patient regarding goals of hospitalization, psycho-education, and discharge planning needs. ? ? ? ?History  ?Relevant Aspects of Hospital Course:  ?Admitted on 08/02/2021 for cardiac arrest 2/2 medication overdose. Was treated appropriately and resuscitated.  ? ?Patient Report:  ?Patient seen in late afternoon. Husband remains at bedside per pt request. ? ?Endorses pain in chest. Has been working really hard in PT to hopefully get well enough for inpt psych treatment. Hopeful she will improve and thankful she lived through [employer] attempt. Discussed changes made to medications - pt and husband in agreement. No active SI, HI, AH/VH. Slept poorly -discussed inc quetiapine.  ? ? ?ROS:  ?Significant pain in chest ? ?Collateral information:  ?Frequent requests for pain meds per nursing staff ? ?Psychiatric History/social/family/substance/medical/surgical history:  ?See initial consult note. ? ?Medications:  ? ?Current Facility-Administered Medications:  ?  acetaminophen (TYLENOL) tablet 650 mg, 650 mg, Oral, Q4H PRN, 650 mg at 08/07/21 1008 **OR** acetaminophen (TYLENOL) 160 MG/5ML solution 650 mg, 650 mg, Per Tube, Q4H PRN **OR** acetaminophen (TYLENOL) suppository 650 mg, 650 mg, Rectal, Q4H PRN, Candee Furbish, MD ?  albuterol (PROVENTIL) (2.5 MG/3ML) 0.083% nebulizer solution 2.5 mg, 2.5 mg, Nebulization, Q2H PRN, Candee Furbish, MD, 2.5 mg at 08/05/21 0041 ?  clonazePAM (KLONOPIN) tablet 1 mg, 1 mg, Oral, BID,  Cinderella, Margaret A, 1 mg at 08/13/21 [id number] ?  docusate sodium (COLACE) capsule 100 mg, 100 mg, Oral, BID PRN, Paytes, Austin A, RPH ?  DULoxetine (CYMBALTA) DR capsule 60 mg, 60 mg, Oral, Daily, Cinderella, Margaret A, 60 mg at 08/13/21 0926 ?  feeding supplement (ENSURE ENLIVE / ENSURE PLUS) liquid 237 mL, 237 mL, Oral, TID BM, Patrecia Pour, MD, 237 mL at 08/13/21 0930 ?  gabapentin (NEURONTIN) capsule 200 mg, 200 mg, Oral, TID, Paytes, Austin A, RPH, 200 mg at 08/13/21 [id number] ?  heparin injection 5,000 Units, 5,000 Units, Subcutaneous, Q8H, Harris, Whitney D, NP, 5,000 Units at 08/13/21 0631 ?  HYDROmorphone (DILAUDID) tablet 1 mg, 1 mg, Oral, Q3H PRN, Candee Furbish, MD, 1 mg at 08/13/21 1222 ?  lip balm (CARMEX) ointment, , Topical, PRN, Candee Furbish, MD ?  losartan (COZAAR) tablet 25 mg, 25 mg, Oral, Daily, Margie Billet, PA-C, 25 mg at 08/13/21 [id number] ?  metoprolol succinate (TOPROL-XL) 24 hr tablet 12.5 mg, 12.5 mg, Oral, Daily, Vance Gather B, MD, 12.5 mg at 08/13/21 [id number] ?  mirtazapine (REMERON) tablet 15 mg, 15 mg, Oral, QHS, Cinderella, Margaret A, 15 mg at 08/12/21 2154 ?  multivitamin with minerals tablet 1 tablet, 1 tablet, Oral, Daily, Patrecia Pour, MD, 1 tablet at 08/13/21 [id number] ?  mupirocin ointment (BACTROBAN) 2 %, , Topical, BID, Patrecia Pour, MD, Given at 08/13/21 0930 ?  naloxone [facility] injection, , Intravenous, PRN, Noemi Chapel, MD, 2 mg at 08/02/21 1819 ?  ondansetron (ZOFRAN) injection 4 mg, 4 mg, Intravenous, Q6H PRN, Gerald Leitz D, NP, 4 mg at 08/07/21 0434 ?  pantoprazole (PROTONIX) EC  tablet 40 mg, 40 mg, Oral, Daily, Paytes, Austin A, RPH, 40 mg at 08/13/21 1222 ?  polyethylene glycol (MIRALAX / GLYCOLAX) packet 17 g, 17 g, Oral, Daily PRN, Paytes, Austin A, RPH ?  potassium chloride SA (KLOR-CON M) CR tablet 20 mEq, 20 mEq, Oral, BID, Candee Furbish, MD, 20 mEq at 08/13/21 [id number] ?  QUEtiapine (SEROQUEL) tablet 200 mg, 200 mg, Oral, QHS, Cinderella, Margaret A, 200 mg  at 08/12/21 2153 ?  tiZANidine (ZANAFLEX) tablet 2 mg, 2 mg, Oral, BID, Paytes, Austin A, RPH, 2 mg at 08/13/21 [id number] ?  vitamin B-12 (CYANOCOBALAMIN) tablet 1,000 mcg, 1,000 mcg, Oral, Daily, Patrecia Pour,

## 2021-08-13 NOTE — Progress Notes (Signed)
Physical Therapy Treatment ?Patient Details ?Name: Audrey [name] ?MRN: [id number] ?DOB: 1975/10/06 ?Today's Date: 08/13/2021 ? ? ?History of Present Illness 46 y.o. female admitted 08/02/21 with OOH cardiac arrest. CPR performed for ~30 min. Per chart, pt with multiple missing home opiate meds, suspicious for possible unintentional vs intentional OD as cause of cardiac arrest. ETT (5/6 - 5/8, 59-5/10). Pt also with aspiration pneuomonitis. CT of head and neck unremarkable. PMH: anxiety, arthritis, depression, heart murmur, pseudotumor cerebri, PTSD, schizo affective schizophrenia, sciatica ? ?  ?PT Comments  ? ? The pt was able to make good progress with endurance and stability this session, but continues to have tachycardia with exertion requiring standing or seated rest to recover. The pt was limited to ~150 ft with initial bout of walking before HR reached 140bpm and pt needed standing rest. After 2-3 min, pt returned to room for seated rest before she was able to complete repeated sit-stand transfers (5x sit-stand in 39.6 seconds) and training on stretches to manage low back pain. The pt expressed understanding of all education, will continue to benefit from skilled PT to further progress independence with OOB mobility, self-monitoring of exertion, and HEP.  ?  ?Recommendations for follow up therapy are one component of a multi-disciplinary discharge planning process, led by the attending physician.  Recommendations may be updated based on patient status, additional functional criteria and insurance authorization. ? ?Follow Up Recommendations ? Outpatient PT ?  ?  ?Assistance Recommended at Discharge Intermittent Supervision/Assistance  ?Patient can return home with the following Assistance with cooking/housework;Direct supervision/assist for medications management;Assist for transportation;A little help with walking and/or transfers;A little help with bathing/dressing/bathroom ?  ?Equipment Recommendations ?  Rollator (4 wheels)  ?  ?Recommendations for Other Services   ? ? ?  ?Precautions / Restrictions Precautions ?Precautions: Fall ?Precaution Comments: watch HR ?Restrictions ?Weight Bearing Restrictions: No  ?  ? ?Mobility ? Bed Mobility ?  ?  ?  ?  ?  ?  ?  ?General bed mobility comments: pt OOB in recliner at start and end of session ?  ? ?Transfers ?Overall transfer level: Modified independent ?Equipment used: None ?Transfers: Sit to/from Stand ?Sit to Stand: Supervision ?  ?  ?  ?  ?  ?General transfer comment: no DME and pt able to complete with supervision other than initial stand requiring minG due to minor LOB. 5x sit-stand from recliner without UE support in 39 seconds ?  ? ?Ambulation/Gait ?Ambulation/Gait assistance: Supervision ?Gait Distance (Feet): 150 Feet (+ 50) ?Assistive device: None ?Gait Pattern/deviations: Step-through pattern, Decreased stride length ?Gait velocity: reduced ?  ?  ?General Gait Details: pt with good posture and stride length with HR to max of 140bpm with gait, needing to stop for standing rest after 150 ft with HR return to 130s, then 50 ft to room with HR increasing to 140s again. Recovers to 115bpm with seated rest. ? ? ? ? ?  ?Balance Overall balance assessment: Mild deficits observed, not formally tested ?Sitting-balance support: No upper extremity supported, Feet supported, Feet unsupported ?Sitting balance-Leahy Scale: Good ?Sitting balance - Comments: static sitting without UE support ?  ?Standing balance support: No upper extremity supported ?Standing balance-Leahy Scale: Fair ?Standing balance comment: no UE support with gait, minG-supervision for safety ?  ?  ?  ?  ?  ?  ?  ?  ?  ?  ?  ?  ? ?  ?Cognition Arousal/Alertness: Awake/alert ?Behavior During Therapy: [facility] for tasks assessed/performed ?Overall Cognitive  Status: Within Functional Limits for tasks assessed ?  ?  ?  ?  ?  ?  ?  ?  ?  ?  ?  ?  ?  ?  ?  ?  ?General Comments: following all cues and commands, initially  with minor LOB and pt needing cues to slow movements to improve safety, long discussion about self-monitoring exertion wtih improved performance ?  ?  ? ?  ?Exercises Other Exercises ?Other Exercises: 5x sit-stand without UE support, 39.6 seconds with HR to 130bpm ? ?  ?General Comments General comments (skin integrity, edema, etc.): HR to max 145bpm with gait, recovers slightly with standing rest (to 133bpm) and to 115bpm with seated rest ?  ?  ? ?Pertinent Vitals/Pain Pain Assessment ?Pain Assessment: Faces ?Faces Pain Scale: Hurts little more ?Pain Location: middle of chest (pt indicates from CPR and defibrilator), low back, bilateral knees, and hips (all chronic pain) ?Pain Descriptors / Indicators: Discomfort, Aching ?Pain Intervention(s): Limited activity within patient's tolerance, Monitored during session, Repositioned  ? ? ? ?PT Goals (current goals can now be found in the care plan section) Acute Rehab PT Goals ?Patient Stated Goal: to get therapy before returning home ?PT Goal Formulation: With patient ?Time For Goal Achievement: 08/21/21 ?Potential to Achieve Goals: Good ?Progress towards PT goals: Progressing toward goals ? ?  ?Frequency ? ? ? Min 3X/week ? ? ? ?  ?PT Plan Current plan remains appropriate  ? ? ?   ?AM-PAC PT "6 Clicks" Mobility   ?Outcome Measure ? Help needed turning from your back to your side while in a flat bed without using bedrails?: A Little ?Help needed moving from lying on your back to sitting on the side of a flat bed without using bedrails?: A Little ?Help needed moving to and from a bed to a chair (including a wheelchair)?: A Little ?Help needed standing up from a chair using your arms (e.g., wheelchair or bedside chair)?: A Little ?Help needed to walk in hospital room?: A Little ?Help needed climbing 3-5 steps with a railing? : A Little ?6 Click Score: 18 ? ?  ?End of Session Equipment Utilized During Treatment: Gait belt ?Activity Tolerance: Patient tolerated treatment  well ?Patient left: in chair;with call bell/phone within reach;with nursing/sitter in room ?Nurse Communication: Mobility status ?PT Visit Diagnosis: Other abnormalities of gait and mobility (R26.89);Muscle weakness (generalized) (M62.81);Difficulty in walking, not elsewhere classified (R26.2) ?  ? ? ?Time: 9390-3009 ?PT Time Calculation (min) (ACUTE ONLY): 27 min ? ?Charges:  $Gait Training: 8-22 mins ?$Therapeutic Exercise: 8-22 mins          ?          ? ?Vickki Muff, PT, DPT  ? ?Acute Rehabilitation Department ?Pager #: [phone] - [phone] ? ? ?Ronnie Derby ?08/13/2021, 1:13 PM ? ?

## 2021-08-13 NOTE — Discharge Summary (Signed)
Physician Discharge Summary  ?Yer Castello Cartlidge ONG:[id number] DOB: [birth year] DOA: 08/02/2021 ? ?PCP: Sue Lush, PA-C ? ?Admit date: 08/02/2021 ?Discharge date: 08/13/2021 ? ?Admitted From: Home ?Disposition:  Inpatient psych ? ?Recommendations for Outpatient Follow-up:  ?Follow up with PCP in 1-2 weeks ?Please obtain BMP/CBC in one week ? ?Discharge Condition: Stable ?CODE STATUS: Full ?Diet recommendation: As tolerated ? ?Brief/Interim Summary: ?Audrey [name] is a 46 year old female with a past medical history significant for Pseudotumor cerebri, anxiety, depression, PTSD,, schizoaffective disorder, and chronic opioid use who presented to the ED after suffering outside hospital cardiac arrest.  Per EMS patient was last seen normal per spouse at 4 AM this morning.  She was later found by spouse this afternoon unresponsive prompting EMS call.  Of note patient large volume of hydromorphone tablets (27 tabs missing in 3 dys).  Prolonged arrest in field with 3 arrest on arrival to ED.  ? ?At this time patient is medically stable for discharge and is otherwise awaiting disposition planning per discussion with psychiatric team and case management to ensure safe disposition. ?  ?Assessment and Plan: ? ?Out of hospital cardiac arrest, stress cardiomyopathy: Improving function by repeat echo 5/12.  ?- Continue losartan 25, metoprolol 12.5 ? ?Amnesia, anoxic/toxic encephalopathy: Resolved ?  ?Aspiration pneumonitis, acute hypoxemic respiratory failure:  ?Recurrent episode 5/9 suspicion for volume overloaded -currently resolved ?Completed 5 days ceftriaxone, cultures negative. ?Recommend sleep study after discharge given multiple episodes of nocturnal hypoxia which have now improved.  ?  ?Fall with right face laceration: CT neck/head benign. Healing as anticipated.  ?  ?Deconditioning: Strength and independence improving as appropriate ?  ?Hx of Pseudotumor cerebri: Quiescent ?  ?History of Schizoaffective disorder,  suspect suicide attempt with ongoing depression:  ?- Inpatient psychiatric hospitalization recommended at this time. Currently not IVC. 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  ?- Continue seroquel 264m qHS (was on 1063mTID PTA) ?- Continue remeron 1541mHS ?- Continue clonazepam 1mg70mD (was 1mg 68m PTA) ?- Continue cymbalta 60mg 24my  ?  ?Vitamin B12 deficiency with macrocytic anemia:  ?- Supplement initiated by IM, transition to po.  ?  ?Minimally elevated TSH: ?- Recheck at follow up in 4 to 6 weeks ?  ?AKI due to ATN: Resolved, ?  ?Right chest wall pain:  ?-Without overt findings on clinical exam or imaging  ?-Continue symptomatic care, wean analgesics over-the-counter NSAIDs  ?-Heat/ice as required ?   ?Obesity: Estimated body mass index is 48.66 kg/m? as calculated from the following: ?  Height as of this encounter: _0  (1.626 m). ?  Weight as of this encounter: 128.6 kg. ? ?Discharge Diagnoses:  ?Principal Problem: ?  Cardiac arrest (HCC) ?[place] Problems: ?  Goals of care, counseling/discussion ?  Schizoaffective disorder, depressive type (HCC) ?[place]? ?Discharge Instructions ? ?Discharge Instructions   ? ? Discharge patient   Complete by: As directed ?  ? [facility]: 08[facility]2021  ? ?  ? ?Allergies as of 08/13/2021   ? ?   Reactions  ? Penicillins Anaphylaxis, Rash  ? Prochlorperazine Anaphylaxis, Nausea And Vomiting  ? Acetazolamide Other (See Comments)  ? Joint pain  ? Latex Hives, Swelling  ? Propoxyphene Nausea And Vomiting  ? Nitroglycerin Er Swelling, Other (See Comments)  ? Body swells, but no breathing issues  ? ?  ? ?  ?Medication List  ?  ? ?STOP taking these medications   ? ?  mirtazapine 15 MG disintegrating tablet ?Commonly known as: REMERON SOL-TAB ?Replaced by: mirtazapine 15 MG tablet ?  ?verapamil 180 MG 24 hr capsule ?Commonly known as: VERELAN PM ?  ? ?  ? ?TAKE these  medications   ? ?acetaminophen 325 MG tablet ?Commonly known as: TYLENOL ?Take 2 tablets (650 mg total) by mouth every 4 (four) hours as needed for fever (greater than 37.6 C). ?  ?albuterol 108 (90 Base) MCG/ACT inhaler ?Commonly known as: VENTOLIN HFA ?Inhale 2 puffs into the lungs every 6 (six) hours as needed for wheezing or shortness of breath. ?  ?clonazePAM 1 MG tablet ?Commonly known as: KLONOPIN ?Take 1 mg by mouth 3 (three) times daily. ?  ?cyanocobalamin 1000 MCG tablet ?Take 1 tablet (1,000 mcg total) by mouth daily. ?Start taking on: Aug 14, 2021 ?  ?DULoxetine 60 MG capsule ?Commonly known as: CYMBALTA ?Take 1 capsule (60 mg total) by mouth daily. ?Start taking on: Aug 14, 2021 ?What changed: when to take this ?  ?Gabapentin 50 MG Tabs ?Take 200 mg by mouth 3 (three) times daily. ?What changed:  ?medication strength ?how much to take ?  ?HYDROmorphone 2 MG tablet ?Commonly known as: DILAUDID ?Take 0.5 tablets (1 mg total) by mouth 3 (three) times daily for 3 days. ?What changed: how much to take ?  ?losartan 25 MG tablet ?Commonly known as: COZAAR ?Take 1 tablet (25 mg total) by mouth daily. ?Start taking on: Aug 14, 2021 ?What changed:  ?medication strength ?how much to take ?  ?metoprolol succinate 25 MG 24 hr tablet ?Commonly known as: TOPROL-XL ?Take 0.5 tablets (12.5 mg total) by mouth daily. ?Start taking on: Aug 14, 2021 ?  ?mirtazapine 15 MG tablet ?Commonly known as: REMERON ?Take 1 tablet (15 mg total) by mouth at bedtime. ?Replaces: mirtazapine 15 MG disintegrating tablet ?  ?multivitamin with minerals Tabs tablet ?Take 1 tablet by mouth daily. ?Start taking on: Aug 14, 2021 ?  ?mupirocin ointment 2 % ?Commonly known as: BACTROBAN ?Apply topically 2 (two) times daily. ?  ?naloxone 4 MG/0.1ML Liqd nasal spray kit ?Commonly known as: NARCAN ?Place 1 spray into the nose once as needed (for accidental overdose). ?  ?pantoprazole 40 MG tablet ?Commonly known as: PROTONIX ?Take 1 tablet (40 mg  total) by mouth daily. ?Start taking on: Aug 14, 2021 ?  ?potassium chloride SA 20 MEQ tablet ?Commonly known as: KLOR-CON M ?Take 1 tablet (20 mEq total) by mouth 2 (two) times daily. ?  ?QUEtiapine 200 MG tablet ?Commonly known as: SEROQUEL ?Take 1 tablet (200 mg total) by mouth at bedtime. ?What changed:  ?medication strength ?how much to take ?when to take this ?  ?tiZANidine 2 MG tablet ?Commonly known as: ZANAFLEX ?Take 1 tablet (2 mg total) by mouth 2 (two) times daily. ?What changed:  ?medication strength ?how much to take ?when to take this ?reasons to take this ?  ? ?  ? ? ?Allergies  ?Allergen Reactions  ? Penicillins Anaphylaxis and Rash  ? Prochlorperazine Anaphylaxis and Nausea And Vomiting  ? Acetazolamide Other (See Comments)  ?  Joint pain  ? Latex Hives and Swelling  ? Propoxyphene Nausea And Vomiting  ? Nitroglycerin Er Swelling and Other (See Comments)  ?  Body swells, but no breathing issues  ? ? ?Consultations: ?Psych ? ? ?Procedures/Studies: ?DG Chest 2 View ? ?Result Date: 08/12/2021 ?CLINICAL DATA:  Provided history: CPR on arrival, chest pain, right rib pain, unable to lift right arm without pain.  EXAM: CHEST - 2 VIEW COMPARISON:  Prior chest radiographs 08/05/2021 and earlier. FINDINGS: Interval extubation and removal of a previously demonstrated enteric tube. Heart size within normal limits. Incidentally noted azygos fissure. Aeration of the left lung base has significantly improved. There is now only mild linear atelectasis within the bilateral lung bases. No evidence of pleural effusion or pneumothorax. No acute bony abnormality identified. Clips within surgical the upper abdomen. IMPRESSION: Interval extubation and removal of a previously demonstrated enteric tube. Mild linear atelectasis within the bilateral lung bases. Electronically Signed   By: Kellie Simmering D.O.   On: 08/12/2021 08:36  ? ?DG Abd 1 View ? ?Result Date: 08/03/2021 ?CLINICAL DATA:  Enteric tube placement. EXAM: ABDOMEN -  1 VIEW COMPARISON:  08/02/2021 FINDINGS: Evidence of patient's enteric tube which is coiled once over the stomach with tip over the gastric fundus in the left upper quadrant just below the diaphragm. Remaind

## 2021-08-13 NOTE — Progress Notes (Signed)
Pt transferred to [facility] via [employer].  Rollator sent with pt.  Audrey [name] ? ?

## 2021-08-13 NOTE — TOC Transition Note (Signed)
Transition of Care (TOC) - CM/SW Discharge Note ? ? ?Patient Details  ?Name: Audrey [name] ?MRN: [id number] ?Date of Birth: 1975/09/08 ? ?Transition of Care (TOC) CM/SW Contact:  ?Delilah Shan, LCSWA ?Phone Number: ?08/13/2021, 7:20 PM ? ? ?CSW informed Pt accepted to [facility].  ? ? ?Clinical Narrative:    ? ?Patient will DC to: [facility] ? ?Anticipated DC date: 08/13/2021 ? ?Family notified: Francis Dowse ? ?Transport by: [facility]  ? ? ? ?? ? ?Per MD patient ready for DC to [facility]. RN, patient, patient's family, and facility notified of DC. Pt accepted to [facility]. Room 303-1. Attending Dr. Sherron Flemings. Room ready at 2100. Please call [phone] to call report. DC packet on chart. [facility] requested for patient. ? ?CSW signing off.  ? ? ?Final next level of care:  [facility]) ?Barriers to Discharge: No Barriers Identified ? ? ?Patient Goals and CMS Choice ?Patient states their goals for this hospitalization and ongoing recovery are:: to go to [facility] inpatient psych ?CMS Medicare.gov Compare Post Acute Care list provided to:: Patient ?Choice offered to / list presented to : Patient ? ?Discharge Placement ?  ?           ?Patient chooses bed at:  [facility]) ?Patient to be transferred to facility by: [facility] ?Name of family member notified: Francis Dowse ?Patient and family notified of of transfer: 08/13/21 ? ?Discharge Plan and Services ?  ?  ?           ?  ?  ?  ?  ?  ?  ?  ?  ?  ?  ? ?Social Determinants of Health (SDOH) Interventions ?  ? ? ?Readmission Risk Interventions ?   ? View : No data to display.  ?  ?  ?  ? ? ? ? ? ?

## 2021-08-13 NOTE — Care Management (Signed)
1441 08-13-21 Rollator ordered via [employer] to be delivered to the room prior to transport to [facility]. No further needs identified by Case Manager at this time.  ?

## 2021-08-13 NOTE — Progress Notes (Addendum)
Update- 2:05pm-[facility] informed CSW patient has been tentatively accepted to 303-1 pending negative covid test and voluntary paperwork. CSW faxed over voluntary paperwork and Covid test pending. ? ?CSW received consult for inpatient psych referral for patient. MD confirmed patient medically cleared for dc.CSW spoke with patient at bedside. Patient agreeable to dc to [facility]. Patient signed voluntary admission and consent for treatment form. CSW made referral to [facility] with [facility] for inpatient psych for patient. Voluntary form faxed to [facility] at [facility] requested Covid for patient. CSW will continue to follow. ?

## 2021-08-13 NOTE — Plan of Care (Signed)

## 2021-08-14 ENCOUNTER — Other Ambulatory Visit: Payer: Self-pay

## 2021-08-14 ENCOUNTER — Encounter (HOSPITAL_COMMUNITY): Payer: Self-pay | Admitting: Family

## 2021-08-14 DIAGNOSIS — E538 Deficiency of other specified B group vitamins: Secondary | ICD-10-CM | POA: Diagnosis present

## 2021-08-14 MED ORDER — CLONAZEPAM 0.5 MG PO TABS
0.5000 mg | ORAL_TABLET | Freq: Every day | ORAL | Status: DC
  Administered 2021-08-15 – 2021-08-16 (×2): 0.5 mg via ORAL
  Filled 2021-08-14 (×2): qty 1

## 2021-08-14 MED ORDER — HYDROMORPHONE HCL 2 MG PO TABS
1.0000 mg | ORAL_TABLET | Freq: Three times a day (TID) | ORAL | Status: DC | PRN
  Administered 2021-08-14 – 2021-08-16 (×3): 1 mg via ORAL
  Filled 2021-08-14 (×4): qty 1

## 2021-08-14 MED ORDER — CLONAZEPAM 1 MG PO TABS
1.0000 mg | ORAL_TABLET | Freq: Every day | ORAL | Status: DC
  Administered 2021-08-15: 1 mg via ORAL
  Filled 2021-08-14: qty 1

## 2021-08-14 NOTE — Progress Notes (Signed)
   08/14/21 0800  Psych Admission Type (Psych Patients Only)  Admission Status Voluntary  Psychosocial Assessment  Patient Complaints Depression;Anxiety;Shakiness  Eye Contact Fair  Facial Expression Animated;Anxious  Speech Logical/coherent  Motor Activity Unsteady  Appearance/Hygiene Unremarkable  Behavior Characteristics Cooperative  Mood Pleasant  Thought Process  Coherency WDL  Content WDL  Delusions WDL  Perception WDL  Judgment Poor  Confusion WDL  Danger to Self  Current suicidal ideation? Denies  Danger to Others  Danger to Others None reported or observed

## 2021-08-14 NOTE — Progress Notes (Signed)
1:1 Nursing note:  Pt is laying in bed . Respirations are even and unlabored. No signs of distress. 1:1 continued for pt safety. Safety maintained.  

## 2021-08-14 NOTE — Progress Notes (Signed)
Patient in bed awake. Alert and oriented x 4. Denying SI/HI/AVH. Complaining of increased back pain and stating "no one gave me pain medication.Marland Kitchenibuprofen really am in pain". Patient received Dilaudid 1mg  PO as prescribed. Encouragements and support provided. Safety precautions maintained, sitter at bedside.
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
## 2021-08-14 NOTE — Progress Notes (Signed)
1:1 Nursing note:  Pt is laying in bed sleep . Respirations are even and unlabored. No signs of distress. 1:1 continued for pt safety. Safety maintained.
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
## 2021-08-14 NOTE — [facility] Suicide Risk Assessment (Signed)
[facility] INPATIENT:  Family/Significant Other Suicide Prevention Education  Suicide Prevention Education:  Education Completed; Francis Dowse, husband, [phone]   (name of family member/significant other) has been identified by the patient as the family member/significant other with whom the patient will be residing, and identified as the person(s) who will aid the patient in the event of a mental health crisis (suicidal ideations/suicide attempt).  With written consent from the patient, the family member/significant other has been provided the following suicide prevention education, prior to the and/or following the discharge of the patient.  CSW spoke with patient husband who reports no safety concerns for patient returning to home upon discharge.  Husband reports that he did not foresee any stressors leading to event but that she does have psychiatric history.  Husband reports that he does not have any guns/weapons and that he has secured all medications in the house.   The suicide prevention education provided includes the following: Suicide risk factors Suicide prevention and interventions National Suicide Hotline telephone number [facility] assessment telephone number [facility] Emergency Assistance 911 [facility] and/or Residential Mobile Crisis Unit telephone number  Request made of family/significant other to: Remove weapons (e.g., guns, rifles, knives), all items previously/currently identified as safety concern.   Remove drugs/medications (over-the-counter, prescriptions, illicit drugs), all items previously/currently identified as a safety concern.  The family member/significant other verbalizes understanding of the suicide prevention education information provided.  The family member/significant other agrees to remove the items of safety concern listed above.  Ladarrian Asencio E Weaver Tweed 08/14/2021, 2:51 PM
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
## 2021-08-14 NOTE — Progress Notes (Signed)
Psychoeducational Group Note  Date:  08/14/2021 Time:  2015  Group Topic/Focus:  Wrap up group  Participation Level: Did Not Attend  Participation Quality:  Not Applicable  Affect:  Not Applicable  Cognitive:  Not Applicable  Insight:  Not Applicable  Engagement in Group: Not Applicable  Additional Comments:  Did not attend   Marcille Buffy 08/14/2021, 8:58 PM

## 2021-08-15 ENCOUNTER — Encounter (HOSPITAL_COMMUNITY): Payer: Self-pay

## 2021-08-15 DIAGNOSIS — E559 Vitamin D deficiency, unspecified: Secondary | ICD-10-CM | POA: Diagnosis present

## 2021-08-15 LAB — LIPID PANEL
Cholesterol: 219 mg/dL — ABNORMAL HIGH (ref 0–200)
HDL: 48 mg/dL (ref >40)
LDL Cholesterol: 130 mg/dL — ABNORMAL HIGH (ref 0–99)
Total CHOL/HDL Ratio: 4.6 RATIO
Triglycerides: 203 mg/dL — ABNORMAL HIGH (ref <150)
VLDL: 41 mg/dL — ABNORMAL HIGH (ref 0–40)

## 2021-08-15 LAB — HEMOGLOBIN A1C
Hgb A1c MFr Bld: 4.8 % (ref 4.8–5.6)
Mean Plasma Glucose: 91.06 mg/dL

## 2021-08-15 LAB — HEPATIC FUNCTION PANEL
ALT: 40 U/L (ref 0–44)
AST: 40 U/L (ref 15–41)
Albumin: 3.7 g/dL (ref 3.5–5.0)
Alkaline Phosphatase: 124 U/L (ref 38–126)
Bilirubin, Direct: 0.2 mg/dL (ref 0.0–0.2)
Indirect Bilirubin: 0.7 mg/dL (ref 0.3–0.9)
Total Bilirubin: 0.9 mg/dL (ref 0.3–1.2)
Total Protein: 7.3 g/dL (ref 6.5–8.1)

## 2021-08-15 LAB — RPR: RPR Ser Ql: NONREACTIVE

## 2021-08-15 LAB — T4, FREE: Free T4: 0.85 ng/dL (ref 0.61–1.12)

## 2021-08-15 LAB — VITAMIN D 25 HYDROXY (VIT D DEFICIENCY, FRACTURES): Vit D, 25-Hydroxy: 8.85 ng/mL — ABNORMAL LOW (ref 30–100)

## 2021-08-15 MED ORDER — VITAMIN D (ERGOCALCIFEROL) 1.25 MG (50000 UNIT) PO CAPS
50000.0000 [IU] | ORAL_CAPSULE | ORAL | Status: DC
  Administered 2021-08-15: 50000 [IU] via ORAL
  Filled 2021-08-15 (×2): qty 1

## 2021-08-15 NOTE — Group Note (Signed)
[facility] LCSW Group Therapy   08/15/2021 2:00 PM    Type of Therapy and Topic:  Group Therapy:  Strengths Exploration   Participation Level: Did Not Attend  Description of Group: This group allows individuals to explore their strengths, learn to use strengths in new ways to improve well-being. Strengths-based interventions involve identifying strengths, understanding how they are used, and learning new ways to apply them. Individuals will identify their strengths, and then explore their roles in different areas of life (relationships, professional life, and personal fulfillment). Individuals will think about ways in which they currently use their strengths, along with new ways they could begin using them.    Therapeutic Goals Patient will verbalize two of their strengths Patient will identify how their strengths are currently used Patient will identify two new ways to apply their strengths  Patients will create a plan to apply their strengths in their daily lives     Summary of Patient Progress:  Did not attend.        Therapeutic Modalities Cognitive Behavioral Therapy Motivational Interviewing   Abbagale Goguen, LCSW, LCAS Clincal Social Worker  [facility]

## 2021-08-15 NOTE — Progress Notes (Signed)
Patient is resting in bed at this time with a staff in room with her. Patient denies SI/HI/AVH. Patient is compliant with am medication with no side effect noted. No acute acute distress noted at this this time. Staff will continue to monitor,  08/15/21 0823  Psych Admission Type (Psych Patients Only)  Admission Status Voluntary  Psychosocial Assessment  Patient Complaints Anxiety;Depression  Eye Contact Fair  Facial Expression Animated  Affect Blunted  Speech Logical/coherent  Interaction Assertive  Motor Activity Unsteady  Appearance/Hygiene Unremarkable  Behavior Characteristics Cooperative;Appropriate to situation  Mood Pleasant  Thought Process  Coherency WDL  Content WDL  Delusions None reported or observed  Perception WDL  Hallucination None reported or observed  Judgment Poor  Confusion None  Danger to Self  Current suicidal ideation? Denies  Agreement Not to Harm Self Yes  Description of Agreement Verbally  Danger to Others  Danger to Others None reported or observed

## 2021-08-15 NOTE — Group Note (Signed)
Recreation Therapy Group Note   Group Topic:Stress Management  Group Date: 08/15/2021 Start Time: 0935 End Time: 0950 Facilitators: Caroll Rancher, [place] Location: [address] Dayroom   Goal Area(s) Addresses:  Patient will identify positive stress management techniques. Patient will identify benefits of using stress management post d/c.  Group Description:  Meditation.  LRT played a meditation that focused on being resilient in the face of resistance and let down.  Patients were to listen and follow along to fully engage in the meditation as it played.  Patents were encouraged to look for sources to use for stress management such as Apps, Youtube, scripts, etc.    Affect/Mood: N/A   Participation Level: Did not attend    Clinical Observations/Individualized Feedback:     Plan: Continue to engage patient in RT group sessions 2-3x/week.   Caroll Rancher, LRT,CTRS  08/15/2021 11:26 AM

## 2021-08-15 NOTE — Progress Notes (Signed)
   08/14/21 2200  Psych Admission Type (Psych Patients Only)  Admission Status Voluntary  Psychosocial Assessment  Patient Complaints Anxiety;Depression  Eye Contact Fair  Facial Expression Animated  Affect Constricted  Speech Logical/coherent  Interaction Assertive  Motor Activity Unsteady  Appearance/Hygiene Unremarkable  Behavior Characteristics Cooperative  Mood Pleasant  Thought Process  Coherency WDL  Content WDL  Delusions None reported or observed  Perception WDL  Hallucination None reported or observed  Judgment Poor  Confusion None  Danger to Self  Current suicidal ideation? Denies  Danger to Others  Danger to Others None reported or observed

## 2021-08-15 NOTE — Progress Notes (Signed)
Pt reports good sleep this morning, slept well though out the night with a few trips to the bathroom. Pt observed ambulating with the walker and seems not unsteady on her feet. Staff by the side of the pt. She denies SI and contracted, will continue to monitor.

## 2021-08-15 NOTE — BH IP Treatment Plan (Signed)
Interdisciplinary Treatment and Diagnostic Plan Update  08/15/2021 Time of Session: 9:30am Audrey [name] MRN: [id number]  Principal Diagnosis: MDD (major depressive disorder), recurrent episode, severe ([place])  Secondary Diagnoses: Principal Problem:   MDD (major depressive disorder), recurrent episode, severe ([place]) Active Problems:   Chronic pain syndrome   Schizoaffective disorder, depressive type ([place])   Anxiety   PTSD (post-traumatic stress disorder)   B12 deficiency   Current Medications:  Current Facility-Administered Medications  Medication Dose Route Frequency Provider Last Rate Last Admin   acetaminophen (TYLENOL) tablet 650 mg  650 mg Oral Q6H PRN Suella Broad, FNP   650 mg at 08/14/21 2134   alum & mag hydroxide-simeth (MAALOX/MYLANTA) 200-200-20 MG/5ML suspension 30 mL  30 mL Oral Q4H PRN Starkes-Perry, Gayland Curry, FNP       clonazePAM Bobbye [place]) tablet 0.5 mg  0.5 mg Oral Daily Briant Cedar, MD   0.5 mg at 08/15/21 0816   clonazePAM (KLONOPIN) tablet 1 mg  1 mg Oral Daily Pashayan, Redgie Grayer, MD       docusate sodium (COLACE) capsule 100 mg  100 mg Oral BID PRN Suella Broad, FNP       DULoxetine (CYMBALTA) DR capsule 60 mg  60 mg Oral Daily Suella Broad, FNP   60 mg at 08/15/21 0816   gabapentin (NEURONTIN) capsule 200 mg  200 mg Oral TID Suella Broad, FNP   200 mg at 08/15/21 0816   HYDROmorphone (DILAUDID) tablet 1 mg  1 mg Oral TID PRN Briant Cedar, MD   1 mg at 08/14/21 1536   losartan (COZAAR) tablet 25 mg  25 mg Oral Daily Suella Broad, FNP   25 mg at 08/15/21 0816   magnesium hydroxide (MILK OF MAGNESIA) suspension 30 mL  30 mL Oral Daily PRN Suella Broad, FNP       multivitamin with minerals tablet 1 tablet  1 tablet Oral Daily Suella Broad, FNP   1 tablet at 08/15/21 0816   pantoprazole (PROTONIX) EC tablet 40 mg  40 mg Oral Daily Suella Broad, FNP   40 mg at  08/15/21 0816   potassium chloride SA (KLOR-CON M) CR tablet 20 mEq  20 mEq Oral BID Suella Broad, FNP   20 mEq at 08/15/21 0816   QUEtiapine (SEROQUEL) tablet 200 mg  200 mg Oral QHS Suella Broad, FNP   200 mg at 08/14/21 2131   tiZANidine (ZANAFLEX) tablet 2 mg  2 mg Oral BID Suella Broad, FNP   2 mg at 08/15/21 [id number]   vitamin B-12 (CYANOCOBALAMIN) tablet 1,000 mcg  1,000 mcg Oral Daily Suella Broad, FNP   1,000 mcg at 08/15/21 [id number]   PTA Medications: Medications Prior to Admission  Medication Sig Dispense Refill Last Dose   acetaminophen (TYLENOL) 325 MG tablet Take 2 tablets (650 mg total) by mouth every 4 (four) hours as needed for fever (greater than 37.6 C). 90 tablet 0    albuterol (VENTOLIN HFA) 108 (90 Base) MCG/ACT inhaler Inhale 2 puffs into the lungs every 6 (six) hours as needed for wheezing or shortness of breath.      clonazePAM (KLONOPIN) 1 MG tablet Take 1 mg by mouth 3 (three) times daily.      DULoxetine (CYMBALTA) 60 MG capsule Take 1 capsule (60 mg total) by mouth daily. 30 capsule 0    gabapentin 50 MG TABS Take 200 mg by mouth 3 (three) times daily. 360 tablet  0    HYDROmorphone (DILAUDID) 2 MG tablet Take 0.5 tablets (1 mg total) by mouth 3 (three) times daily for 3 days. 5 tablet 0    losartan (COZAAR) 25 MG tablet Take 1 tablet (25 mg total) by mouth daily. 30 tablet 0    metoprolol succinate (TOPROL-XL) 25 MG 24 hr tablet Take 0.5 tablets (12.5 mg total) by mouth daily. 15 tablet 0    mirtazapine (REMERON) 15 MG tablet Take 1 tablet (15 mg total) by mouth at bedtime. 30 tablet 0    Multiple Vitamin (MULTIVITAMIN WITH MINERALS) TABS tablet Take 1 tablet by mouth daily. 30 tablet 0    mupirocin ointment (BACTROBAN) 2 % Apply topically 2 (two) times daily. 22 g 0    naloxone (NARCAN) nasal spray 4 mg/0.1 mL Place 1 spray into the nose once as needed (for accidental overdose).      pantoprazole (PROTONIX) 40 MG tablet Take 1 tablet  (40 mg total) by mouth daily. 30 tablet 0    potassium chloride SA (KLOR-CON M) 20 MEQ tablet Take 1 tablet (20 mEq total) by mouth 2 (two) times daily. 60 tablet 0    QUEtiapine (SEROQUEL) 200 MG tablet Take 1 tablet (200 mg total) by mouth at bedtime. 30 tablet 0    tiZANidine (ZANAFLEX) 2 MG tablet Take 1 tablet (2 mg total) by mouth 2 (two) times daily. 30 tablet 0    vitamin B-12 1000 MCG tablet Take 1 tablet (1,000 mcg total) by mouth daily. 30 tablet 0     Patient Stressors:    Patient Strengths:    Treatment Modalities: Medication Management, Group therapy, Case management,  1 to 1 session with clinician, Psychoeducation, Recreational therapy.   Physician Treatment Plan for Primary Diagnosis: MDD (major depressive disorder), recurrent episode, severe ([place]) Long Term Goal(s): Improvement in symptoms so as ready for discharge   Short Term Goals: Ability to identify changes in lifestyle to reduce recurrence of condition will improve Ability to verbalize feelings will improve Ability to demonstrate self-control will improve Ability to identify and develop effective coping behaviors will improve Ability to maintain clinical measurements within normal limits will improve Compliance with prescribed medications will improve Ability to identify triggers associated with substance abuse/mental health issues will improve  Medication Management: Evaluate patient's response, side effects, and tolerance of medication regimen.  Therapeutic Interventions: 1 to 1 sessions, Unit Group sessions and Medication administration.  Evaluation of Outcomes: Progressing  Physician Treatment Plan for Secondary Diagnosis: Principal Problem:   MDD (major depressive disorder), recurrent episode, severe ([place]) Active Problems:   Chronic pain syndrome   Schizoaffective disorder, depressive type ([facility])   Anxiety   PTSD (post-traumatic stress disorder)   B12 deficiency  Long Term Goal(s): Improvement in  symptoms so as ready for discharge   Short Term Goals: Ability to identify changes in lifestyle to reduce recurrence of condition will improve Ability to verbalize feelings will improve Ability to demonstrate self-control will improve Ability to identify and develop effective coping behaviors will improve Ability to maintain clinical measurements within normal limits will improve Compliance with prescribed medications will improve Ability to identify triggers associated with substance abuse/mental health issues will improve     Medication Management: Evaluate patient's response, side effects, and tolerance of medication regimen.  Therapeutic Interventions: 1 to 1 sessions, Unit Group sessions and Medication administration.  Evaluation of Outcomes: Progressing   RN Treatment Plan for Primary Diagnosis: MDD (major depressive disorder), recurrent episode, severe ([place]) Long Term Goal(s):  Knowledge of disease and therapeutic regimen to maintain health will improve  Short Term Goals: Ability to remain free from injury will improve, Ability to verbalize frustration and anger appropriately will improve, Ability to demonstrate self-control, Ability to participate in decision making will improve, Ability to verbalize feelings will improve, Ability to disclose and discuss suicidal ideas, Ability to identify and develop effective coping behaviors will improve, and Compliance with prescribed medications will improve  Medication Management: RN will administer medications as ordered by provider, will assess and evaluate patient's response and provide education to patient for prescribed medication. RN will report any adverse and/or side effects to prescribing provider.  Therapeutic Interventions: 1 on 1 counseling sessions, Psychoeducation, Medication administration, Evaluate responses to treatment, Monitor vital signs and CBGs as ordered, Perform/monitor CIWA, COWS, AIMS and Fall Risk screenings as ordered,  Perform wound care treatments as ordered.  Evaluation of Outcomes: Progressing   LCSW Treatment Plan for Primary Diagnosis: MDD (major depressive disorder), recurrent episode, severe ([place]) Long Term Goal(s): Safe transition to appropriate next level of care at discharge, Engage patient in therapeutic group addressing interpersonal concerns.  Short Term Goals: Engage patient in aftercare planning with referrals and resources, Increase social support, Increase ability to appropriately verbalize feelings, Increase emotional regulation, Facilitate acceptance of mental health diagnosis and concerns, Facilitate patient progression through stages of change regarding substance use diagnoses and concerns, Identify triggers associated with mental health/substance abuse issues, and Increase skills for wellness and recovery  Therapeutic Interventions: Assess for all discharge needs, 1 to 1 time with Social worker, Explore available resources and support systems, Assess for adequacy in community support network, Educate family and significant other(s) on suicide prevention, Complete Psychosocial Assessment, Interpersonal group therapy.  Evaluation of Outcomes: Progressing   Progress in Treatment: Attending groups: No. Participating in groups: No. Taking medication as prescribed: Yes. Toleration medication: Yes. Family/Significant other contact made: Yes, individual(s) contacted:  husband Patient understands diagnosis: Yes. Discussing patient identified problems/goals with staff: Yes. Medical problems stabilized or resolved: Yes. Denies suicidal/homicidal ideation: Yes. Issues/concerns per patient self-inventory: No. Other: none  New problem(s) identified: No, Describe:  none reported  New Short Term/Long Term Goal(s):   medication stabilization, elimination of SI thoughts, development of comprehensive mental wellness plan.    Patient Goals:  Pt states that she "would like to go  home."  Discharge Plan or Barriers: Patient recently admitted. CSW will continue to follow and assess for appropriate referrals and possible discharge planning.    Reason for Continuation of Hospitalization: Anxiety Depression Medication stabilization Suicidal ideation  Estimated Length of Stay: 2-3 days  Last 3 [place] Suicide Severity Risk Score: [place] Admission (Current) from 08/13/2021 in [place] [facility] ED to Hosp-Admission (Discharged) from 08/02/2021 in [place] ED to Hosp-Admission (Discharged) from 06/22/2021 in [place] No Risk No Risk No Risk       Last PHQ 2/9 Scores:     View : No data to display.          Scribe for Treatment Team: Zachery Conch, LCSW 08/15/2021 10:03 AM

## 2021-08-15 NOTE — [facility] Group Notes (Signed)
PT attended AA and was appropriate and attentive. 

## 2021-08-15 NOTE — Progress Notes (Signed)
1:1 Note Pt observed lying on back in the bed, staff by the bedside. Pt reported not so good day with poor appetite. Pt stated that she has been received her medication as supposed, pt stated she takes 11 different medications and encouraged to discus that with the doctor. Pt complained of pain and was medicated with tylenol with good relief. Pt also took her scheduled Seroquel with ease. Pt denies SI and contracted for safety, but complain of weakness on her both legs due arthritis. Staff remains at the bedside for safety, will continue to monitor.

## 2021-08-15 NOTE — Progress Notes (Signed)
Patient is in room at this time.  Patient is compliant with scheduled medication, tolerated them well with no adverse effect noted. No acute distress noted at this time. Q 15 minutes safety observation in place. Staff will continue to monitor.

## 2021-08-15 NOTE — Progress Notes (Signed)
Patient is in room at this time. No acute distress noted. Q 15 minutes safety observation in place. Staff will continue to monitor.

## 2021-08-15 NOTE — Evaluation (Signed)
Occupational Therapy Evaluation Patient Details Name: Audrey [name] MRN: [id number] DOB: Mar 23, 1976 Today's Date: 08/15/2021   History of Present Illness 46 y.o. female admitted 08/02/21 with OOH cardiac arrest. CPR performed for ~30 min. Per chart, pt with multiple missing home opiate meds, suspicious for possible unintentional vs intentional OD as cause of cardiac arrest. ETT (5/6 - 5/8, 59-5/10). Pt also with aspiration pneuomonitis. CT of head and neck unremarkable. PMH: anxiety, arthritis, depression, heart murmur, pseudotumor cerebri, PTSD, schizo affective schizophrenia, sciatica   Clinical Impression   OT met w/ pt where she was found to be resting in bed w/ sitter present in room. She is A/o x 6/6, CDT score is 10/10 w/ errorless effort and time setting to 10 past 11. Pt is currently operating at her normal baseline for ADLs and [place] / ambulation w/ rollator provides an increased sense of security and overall balance. She does c/o acute post CPR pain in chest and R flank rated 7/10. OT discussed AE for LB dressing as she does have trouble normally w/ these tasks d/t stiffness in and during bending and reaching. OT to sign off at this time.        Recommendations for follow up therapy are one component of a multi-disciplinary discharge planning process, led by the attending physician.  Recommendations may be updated based on patient status, additional functional criteria and insurance authorization.   Follow Up Recommendations  No OT follow up    Assistance Recommended at Discharge    Patient can return home with the following A little help with bathing/dressing/bathroom    Functional Status Assessment  Patient has not had a recent decline in their functional status  Equipment Recommendations  Other (comment) (hip kit for LE ADLs)    Recommendations for Other Services       Precautions / Restrictions Precautions Precautions: None Restrictions Weight Bearing  Restrictions: No      Mobility Bed Mobility Overal bed mobility: Modified Independent   Rolling: Independent Sidelying to sit: Independent Supine to sit: Independent          Transfers Overall transfer level: Independent Equipment used: Rollator (4 wheels)   Sit to Stand: Independent     Step pivot transfers: Independent     General transfer comment: no LOB w/ and w/o AD from EOB and toilet.      Balance Overall balance assessment: Modified Independent Sitting-balance support: No upper extremity supported Sitting balance-Leahy Scale: Normal Sitting balance - Comments: static sitting without UE support   Standing balance support: No upper extremity supported Standing balance-Leahy Scale: Good                             ADL either performed or assessed with clinical judgement   ADL Overall ADL's : At baseline Eating/Feeding: Modified independent   Grooming: Wash/dry face;Wash/dry hands Grooming Details (indicate cue type and reason): Standing at sink Upper Body Bathing: Modified independent   Lower Body Bathing: Modified independent   Upper Body Dressing : Modified independent   Lower Body Dressing: Moderate assistance (d/t difficulties in bending / reaching w/ increasing pain)   Toilet Transfer: Ambulation;Regular Toilet;Grab bars;Modified Independent Toilet Transfer Details (indicate cue type and reason): MOD I for t/fers on / off toilet; no LOB Toileting- Clothing Manipulation and Hygiene: Modified independent;Sit to/from stand;Supervision/safety         General ADL Comments: Pt participated in eval and CDT w/ beief ADL activity analysis session  today. She was Mod I bed mobility, Mod I sit to stand ambulating to bathroom using rollator.  No hands on assist required and LOB or unsteady gait noted. Pt stood at sink for grooming. Pt ambulated in room w/ and w/o rollator w/o LOB. Pt was educated in basic energy conservation technique verbally. Pt  was left sitting up in chair with sitter in room. Pt is operating at her normal baseline it appears and OT will sign off at this time.     Vision Patient Visual Report: No change from baseline       Perception Perception Perception: Within Functional Limits   Praxis Praxis Praxis: Intact    Pertinent Vitals/Pain Pain Assessment Pain Assessment: 0-10 Pain Score: 7  Breathing: normal Negative Vocalization: none Facial Expression: smiling or inexpressive Body Language: relaxed Consolability: no need to console PAINAD Score: 0 Facial Expression: Relaxed, neutral Body Movements: Absence of movements Muscle Tension: Relaxed Compliance with ventilator (intubated pts.): N/A Vocalization (extubated pts.): N/A CPOT Total: 0 Pain Location: middle of chest (pt indicates from CPR and defibrilator), low back, bilateral knees, and hips (all chronic pain) Pain Descriptors / Indicators: Discomfort, Aching     Hand Dominance Right   Extremity/Trunk Assessment Upper Extremity Assessment Upper Extremity Assessment: Overall WFL for tasks assessed   Lower Extremity Assessment Lower Extremity Assessment: Defer to PT evaluation       Communication Communication Communication: No difficulties   Cognition Arousal/Alertness: Awake/alert Behavior During Therapy: WFL for tasks assessed/performed Overall Cognitive Status: Within Functional Limits for tasks assessed                     Current Attention Level: Focused           General Comments: a/o x 6/6; CDT: errorless effort in construction and time setting to 10 past 11     General Comments       Exercises     Shoulder Instructions      Home Living Family/patient expects to be discharged to:: Private residence Living Arrangements: Spouse/significant other Available Help at Discharge: Family Type of Home: Apartment Home Access: Level entry     Home Layout: One level     Bathroom Shower/Tub: Medical illustrator: Standard Bathroom Accessibility: Yes How Accessible: Accessible via walker Home Equipment: Conservation officer, nature (2 wheels);Shower seat;Grab bars - toilet;Grab bars - tub/shower;Wheelchair - manual;Cane - single point          Prior Functioning/Environment Prior Level of Function : Needs assist;Other (comment) (d/t chronic LB back pain limiting her ability to engage in bending and reaching to LEs)             Mobility Comments: Pt reports she has not ambulated far distances even in her apartment in "a long time", reportedly only ambulating up to ~15 steps at a time with a RW mod I. When ambulating to/from the car her husband always supervises her. Pt reports she has fallen 3x this year d/t knees 'giving out" ADLs Comments: Reports she can clean and dress herself, but her husband manages the finances and does the cooking, cleaning and driving.        OT Problem List: Pain;Decreased activity tolerance      OT Treatment/Interventions:      OT Goals(Current goals can be found in the care plan section) Acute Rehab OT Goals Patient Stated Goal: "Looking forward to going home soon, hopefully tomorrow" OT Goal Formulation: With patient Time For Goal Achievement: 08/15/21  Potential to Achieve Goals: Good  OT Frequency:      Co-evaluation              AM-PAC OT "6 Clicks" Daily Activity     Outcome Measure Help from another person eating meals?: None Help from another person taking care of personal grooming?: None Help from another person toileting, which includes using toliet, bedpan, or urinal?: None Help from another person bathing (including washing, rinsing, drying)?: None Help from another person to put on and taking off regular upper body clothing?: None Help from another person to put on and taking off regular lower body clothing?: A Little 6 Click Score: 23   End of Session Equipment Utilized During Treatment: Rollator (4 wheels) Nurse Communication:  Mobility status;Other (comment) (informed team PT could sign off and OT would also DC services at this time)  Activity Tolerance: Patient tolerated treatment well Patient left: in bed;with nursing/sitter in room  OT Visit Diagnosis: Unsteadiness on feet (R26.81);Muscle weakness (generalized) (M62.81);Pain Pain - Right/Left: Right                Time: 8833-7445 OT Time Calculation (min): 31 min Charges:  OT General Charges $OT Visit: 1 Visit OT Evaluation $OT Eval Low Complexity: 1 Low OT Treatments $Self Care/Home Management : 8-22 mins  Cornell Barman, OT   Brantley Stage 08/15/2021, 8:32 PM

## 2021-08-15 NOTE — Progress Notes (Signed)
PT Cancellation Note  Patient Details Name: Audrey [name] MRN: [id number] DOB: [birth year]   Cancelled Treatment:    Reason Eval/Treat Not Completed: PT screened, no needs identified, will sign off (OT evaluated and pt and pt is mobilizing at Mod I level with rollator and has no acute PT needs. Will sign off at this time.)   Renaldo Fiddler PT, DPT Acute Rehabilitation Services Office [phone] Pager [phone]  08/15/21 3:00 PM

## 2021-08-15 NOTE — Progress Notes (Signed)
Patient 1:1 observation D/C at 1444. Patient seen in the milieu walking with assistive aid. Staff will continue to monitor.
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
## 2021-08-15 NOTE — Plan of Care (Signed)
  Problem: Coping: Goal: Ability to verbalize frustrations and anger appropriately will improve Outcome: Progressing Goal: Ability to demonstrate self-control will improve Outcome: Progressing   Problem: Safety: Goal: Periods of time without injury will increase Outcome: Progressing   

## 2021-08-16 DIAGNOSIS — F332 Major depressive disorder, recurrent severe without psychotic features: Principal | ICD-10-CM

## 2021-08-16 DIAGNOSIS — F4310 Post-traumatic stress disorder, unspecified: Secondary | ICD-10-CM

## 2021-08-16 DIAGNOSIS — G894 Chronic pain syndrome: Secondary | ICD-10-CM

## 2021-08-16 DIAGNOSIS — F419 Anxiety disorder, unspecified: Secondary | ICD-10-CM

## 2021-08-16 LAB — T3, FREE: T3, Free: 2.6 pg/mL (ref 2.0–4.4)

## 2021-08-16 MED ORDER — DOCUSATE SODIUM 100 MG PO CAPS: 100.0000 mg | ORAL_CAPSULE | Freq: Two times a day (BID) | ORAL | 0 refills | Status: DC | PRN

## 2021-08-16 MED ORDER — VITAMIN D (ERGOCALCIFEROL) 1.25 MG (50000 UNIT) PO CAPS: 50000.0000 [IU] | ORAL_CAPSULE | ORAL | 0 refills | Status: DC

## 2021-08-16 MED ORDER — DULOXETINE HCL 60 MG PO CPEP: 60.0000 mg | ORAL_CAPSULE | Freq: Every day | ORAL | 0 refills | Status: DC

## 2021-08-16 NOTE — Progress Notes (Signed)
  [facility] Adult Case Management Discharge Plan :  Will you be returning to the same living situation after discharge:  Yes,  home with husband At discharge, do you have transportation home?: Yes,  husband Do you have the ability to pay for your medications: Yes,  insurance  Release of information consent forms completed and emailed to Medical Records, then turned in to Medical Records by CSW.   Patient to Follow up at:  Follow-up Information     Nathaneil Canary, PA-C Follow up.   Specialty: Physician Assistant Contact information: [address] [place] [address] [phone]         [facility] [place] Follow up.   Why: We have called this provider to set up an appointment and had to leave a voicemail.  Please follow up with this provider to get a hospital follow up appt with Aggie Cosier for ongoing services. Contact information: [address] [place], [place] [address]  Phone: [phone]        [facility]. Go on 09/02/2021.   Why: You have an appointment for therapy services on 09/02/21 at 11:00 am.  This appointment will be held in person. Contact information: [address] [place], [place] [address] [phone]         [facility] Follow up.   Why: You may also schedule med management services with this provider if you choose. Contact information: [address] [place] [address] [phone]                 Next level of care provider has access to [facility] Link:no  Safety Planning and Suicide Prevention discussed: Yes,  with husband     Has patient been referred to the Quitline?: N/A patient is not a smoker  Patient has been referred for addiction treatment: N/A  Lynnell Chad, LCSW 08/16/2021, 9:35 AM

## 2021-08-16 NOTE — Group Note (Signed)
LCSW Group Therapy Note 08/16/2021  11:15am-12:00pm  Type of Therapy and Topic:  Group Therapy: Anger and Commonalities  Participation Level:  Did Not Attend   Description of Group: In this group, patients initially shared an "unknown" fact about themselves and CSW led a discussion about the ways in which we have things in common without realizing it.  Patient then identified a recent time they became angry and how this yet again showed a way in which they had something in common with other patients.  We discussed possible unhealthy reactions to anger and possible healthy reactions.  We also discussed possible underlying emotions that lead to the anger.  Commonalities among group members were pointed out throughout the entirety of group.  Therapeutic Goals: Patients were asked to share something about themselves and learned that they often have things in common with other people without knowing this Patients will remember an incident of anger and how they reacted Patients will be able to identify their reaction as healthy or unhealthy, and identify possible reactions that would have been the opposite Patients will learn that anger itself is a secondary emotion and will think about their primary emotion at the time of their last incident of anger  Summary of Patient Progress:  The patient was invited to group, did not attend.  Therapeutic Modalities:   Cognitive Behavioral Therapy  Lynnell Chad, LCSW 08/16/2021  11:25 AM

## 2021-08-16 NOTE — Progress Notes (Signed)
   08/15/21 2200  Psych Admission Type (Psych Patients Only)  Admission Status Voluntary  Psychosocial Assessment  Patient Complaints Anxiety  Eye Contact Fair  Facial Expression Flat  Affect Blunted  Speech Logical/coherent  Interaction Assertive  Motor Activity Unsteady  Appearance/Hygiene In hospital gown  Behavior Characteristics Appropriate to situation  Mood Pleasant  Thought Process  Coherency WDL  Content WDL  Delusions None reported or observed  Perception WDL  Hallucination None reported or observed  Judgment Poor  Confusion None  Danger to Self  Current suicidal ideation? Denies  Agreement Not to Harm Self Yes  Description of Agreement verbal  Danger to Others  Danger to Others None reported or observed
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
## 2021-08-16 NOTE — Progress Notes (Signed)
Pt discharged to lobby where fiance is waiting. Pt was stable and appreciative at that time. All papers and electronic  prescriptions were given. Patient had no belongings to return. Verbal understanding expressed. Denies SI/HI and A/VH. Pt given opportunity to express concerns and ask questions.

## 2021-08-16 NOTE — Progress Notes (Signed)
Patient did not attend morning orientation/goals group because she was asleep.  

## 2021-08-16 NOTE — Progress Notes (Signed)
D. Pt was friendly upon approach- voiced no complaints other than bilateral rib pain rated an 8/10. Pt has been visible in the milieu ambulating well with walker, and observed attending groups.Pt currently denies SI/HI and AVH A. Labs and vitals monitored. Pt given scheduled medications and prn med for pain.  Pt supported emotionally and encouraged to express concerns and ask questions.   R. Pt remains safe with 15 minute checks. Will continue POC.
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
## 2021-08-19 LAB — VITAMIN B1: Vitamin B1 (Thiamine): 177.1 nmol/L (ref 66.5–200.0)

## 2021-11-17 ENCOUNTER — Emergency Department: Payer: BC Managed Care – PPO

## 2021-11-17 ENCOUNTER — Emergency Department
Admission: EM | Admit: 2021-11-17 | Discharge: 2021-11-17 | Disposition: A | Discharge status: Home / Self Care | Payer: BC Managed Care – PPO | Attending: Emergency Medicine | Admitting: Emergency Medicine

## 2021-11-17 ENCOUNTER — Other Ambulatory Visit: Payer: Self-pay

## 2021-11-17 DIAGNOSIS — S5002XA Contusion of left elbow, initial encounter: Secondary | ICD-10-CM | POA: Insufficient documentation

## 2021-11-17 DIAGNOSIS — W010XXA Fall on same level from slipping, tripping and stumbling without subsequent striking against object, initial encounter: Secondary | ICD-10-CM | POA: Insufficient documentation

## 2021-11-17 DIAGNOSIS — S79912A Unspecified injury of left hip, initial encounter: Secondary | ICD-10-CM | POA: Diagnosis present

## 2021-11-17 DIAGNOSIS — S7002XA Contusion of left hip, initial encounter: Secondary | ICD-10-CM | POA: Diagnosis not present

## 2021-11-17 DIAGNOSIS — W19XXXA Unspecified fall, initial encounter: Secondary | ICD-10-CM

## 2021-11-17 MED ORDER — METHOCARBAMOL 500 MG PO TABS: 500.0000 mg | ORAL_TABLET | Freq: Four times a day (QID) | ORAL | 0 refills | Status: DC

## 2021-11-17 MED ORDER — METHOCARBAMOL 500 MG PO TABS
1000.0000 mg | ORAL_TABLET | Freq: Once | ORAL | Status: AC
  Administered 2021-11-17: 1000 mg via ORAL
  Filled 2021-11-17: qty 2

## 2021-11-17 MED ORDER — KETOROLAC TROMETHAMINE 10 MG PO TABS: 10.0000 mg | ORAL_TABLET | Freq: Four times a day (QID) | ORAL | 0 refills | Status: DC | PRN

## 2021-11-17 MED ORDER — PREDNISONE 50 MG PO TABS: 50.0000 mg | ORAL_TABLET | Freq: Every day | ORAL | 0 refills | Status: DC

## 2021-11-17 MED ORDER — DEXAMETHASONE SODIUM PHOSPHATE 10 MG/ML IJ SOLN
10.0000 mg | Freq: Once | INTRAMUSCULAR | Status: AC
  Administered 2021-11-17: 10 mg via INTRAMUSCULAR
  Filled 2021-11-17: qty 1

## 2021-11-17 MED ORDER — KETOROLAC TROMETHAMINE 30 MG/ML IJ SOLN
30.0000 mg | Freq: Once | INTRAMUSCULAR | Status: AC
  Administered 2021-11-17: 30 mg via INTRAMUSCULAR
  Filled 2021-11-17: qty 1

## 2021-11-17 MED ORDER — HYDROMORPHONE HCL 1 MG/ML IJ SOLN
1.0000 mg | Freq: Once | INTRAMUSCULAR | Status: AC
  Administered 2021-11-17: 1 mg via INTRAMUSCULAR
  Filled 2021-11-17: qty 1

## 2021-11-17 MED ORDER — ONDANSETRON 8 MG PO TBDP
8.0000 mg | ORAL_TABLET | Freq: Once | ORAL | Status: AC
  Administered 2021-11-17: 8 mg via ORAL
  Filled 2021-11-17: qty 1

## 2021-11-17 NOTE — ED Provider Triage Note (Signed)
Emergency Medicine Provider Triage Evaluation Note  Audrey [name] , a 46 y.o. female  was evaluated in triage.  Pt complains of left hip, left elbow pain.  Patient has a history of osteoarthritis, states that her right leg gave out from underneath her causing her to twist and fall on the left side.  The left side of her hip hit the bathtub.  She is having sharp pain in the left hip, left elbow.  Did not hit head or lose consciousness..  Review of Systems  Positive: Left hip and left elbow pain Negative: Head injury, open wound  Physical Exam  BP 119/79 (BP Location: Left Arm)   Pulse (!) 116   Temp 98.1 F (36.7 C) (Oral)   Resp 18   Ht 5\' 3"  (1.6 m)   Wt 117.9 kg   SpO2 97%   BMI 46.06 kg/m  Gen:   Awake, no distress   Resp:  Normal effort  MSK:   Moves extremities.  Patient very tender to palpation of the left hip and left elbow.  No palpable abnormality Other:    Medical Decision Making  Medically screening exam initiated at 6:50 PM.  Appropriate orders placed.  Mikeala Girdler Navarrette was informed that the remainder of the evaluation will be completed by another provider, this initial triage assessment does not replace that evaluation, and the importance of remaining in the ED until their evaluation is complete.  Patient presented after a mechanical fall.  Left hip and left elbow pain.  Patient will have imaging at this time.   Marcelle Smiling, PA-C 11/17/21 1850

## 2021-11-17 NOTE — ED Triage Notes (Signed)
Pt to ED via POV from home. Pt reports hx osteoarthritis and right leg gave out causing her to fall on her left side.   Pt reports left hip, left knee and left elbow pain. Pt states she thinks she felt a pop.  Pt reports after fall cannot straighten left leg or lay down completely without excruciating pain. Pt states pain is inducing nausea and HA.   Pt reports last dose of hydromorphone was at 11am.

## 2021-11-17 NOTE — ED Provider Notes (Signed)
[facility] Provider Note  Patient Contact: 6:52 PM (approximate)   History   Fall and Hip Pain   HPI  Audrey [name] is a 46 y.o. female  who presents to the ED with a complaint of left hip and left elbow pain after mechanical fall.  Patient states that she has osteoarthritis, her right leg gave out from underneath her causing her to fall.  She states that she was trying to get herself but twisted, landing on her left elbow and hip.  Did not hit her head or lose consciousness.  She is complaining of the worst pain in her left hip.  Patient denies any bowel or bladder dysfunction, saddle anesthesia or paresthesias though she endorses back pain.  She states that she has chronic back pain and it feels like it may be slightly worse.  Again the impact occurred on the left hip and left elbow.  Patient is on chronic pain medication and took hydromorphone prior to arrival with no relief of symptoms.     Physical Exam   Triage Vital Signs: ED Triage Vitals  Enc Vitals Group     BP 11/17/21 1843 119/79     Pulse Rate 11/17/21 1842 (!) 116     Resp 11/17/21 1842 18     Temp 11/17/21 1842 98.1 F (36.7 C)     Temp Source 11/17/21 1842 Oral     SpO2 11/17/21 1842 97 %     Weight 11/17/21 1842 260 lb (117.9 kg)     Height 11/17/21 1842 5\' 3"  (1.6 m)     Head Circumference --      Peak Flow --      Pain Score --      Pain Loc --      Pain Edu? --      Excl. in [place]? --     Most recent vital signs: Vitals:   11/17/21 1842 11/17/21 1843  BP:  119/79  Pulse: (!) 116   Resp: 18   Temp: 98.1 F (36.7 C)   SpO2: 97%      General: Alert and in no acute distress.  Cardiovascular:  Good peripheral perfusion Respiratory: Normal respiratory effort without tachypnea or retractions. Lungs CTAB.  Musculoskeletal: Patient has mild tenderness along the lateral posterior aspect of the elbow with no deformity.  Good range of motion preserved.  No ecchymosis or open  wounds.  Evaluation of the left hip reveals no deformity, shortening or rotation of the left lower extremity.  Patient has limited range of motion due to pain.  Significant tenderness over the groin.  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  No tenderness over the distal femur or left knee.  No significant tenderness to the lumbar spine.  Pulses and sensation intact and equal bilateral lower extremities.    Neurologic:  No gross focal neurologic deficits are appreciated.  Skin:   No rash noted Other:   ED Results / Procedures / Treatments   Labs (all labs ordered are listed, but only abnormal results are displayed) Labs Reviewed  POC URINE PREG, ED     EKG     RADIOLOGY  I personally viewed, evaluated, and interpreted these images as part of my medical decision making, as well as reviewing the written report by the radiologist.  ED Provider Interpretation: No acute traumatic findings to the left elbow, spine or hip.  DG Elbow Complete Left  Result Date: 11/17/2021 CLINICAL DATA:  Fall, left elbow  pain EXAM: LEFT ELBOW - COMPLETE 3+ VIEW COMPARISON:  None Available. FINDINGS: There is no evidence of fracture, dislocation, or joint effusion. There is no evidence of arthropathy or other focal bone abnormality. Soft tissues are unremarkable. IMPRESSION: Negative. Electronically Signed   By: Helyn Numbers M.D.   On: 11/17/2021 19:40   DG Lumbar Spine 2-3 Views  Result Date: 11/17/2021 CLINICAL DATA:  Fall, left hip pain EXAM: LUMBAR SPINE - 2-3 VIEW COMPARISON:  None Available. FINDINGS: Lateral imaging is limited by underpenetration. There is no evidence of lumbar spine fracture. Alignment is normal. Intervertebral disc spaces are maintained. IMPRESSION: No acute fracture or subluxation. Electronically Signed   By: Helyn Numbers M.D.   On: 11/17/2021 19:37   DG Hip Unilat W or Wo Pelvis 2-3 Views Left  Result Date: 11/17/2021 CLINICAL DATA:  Fall, left hip and elbow pain EXAM:  DG HIP (WITH OR WITHOUT PELVIS) 2-3V LEFT COMPARISON:  None Available. FINDINGS: There is no evidence of hip fracture or dislocation. There is no evidence of arthropathy or other focal bone abnormality. IMPRESSION: Negative. Electronically Signed   By: Helyn Numbers M.D.   On: 11/17/2021 19:36    PROCEDURES:  Critical Care performed: No  Procedures   MEDICATIONS ORDERED IN ED: Medications  HYDROmorphone (DILAUDID) injection 1 mg (1 mg Intramuscular Given 11/17/21 1938)  ondansetron (ZOFRAN-ODT) disintegrating tablet 8 mg (8 mg Oral Given 11/17/21 1939)  ketorolac (TORADOL) 30 MG/ML injection 30 mg (30 mg Intramuscular Given 11/17/21 2043)  dexamethasone (DECADRON) injection 10 mg (10 mg Intramuscular Given 11/17/21 2039)  methocarbamol (ROBAXIN) tablet 1,000 mg (1,000 mg Oral Given 11/17/21 2038)     IMPRESSION / MDM / ASSESSMENT AND PLAN / ED COURSE  I reviewed the triage vital signs and the nursing notes.                              Differential diagnosis includes, but is not limited to, left hip fracture, bursitis, contusion, dislocation   Patient's presentation is most consistent with acute presentation with potential threat to life or bodily function.   Patient's diagnosis is consistent with fall, hip contusion, elbow contusion.  Patient suffered a mechanical fall landing on her left side.  Imaging was negative.  Patiently placed on anti-inflammatory, steroid and muscle relaxer.  Patient is already taking chronic pain medications.  She may contact the prescriber to upper dose if needed for additional pain relief.  Follow-up primary care or pain management as needed.   Patient is given ED precautions to return to the ED for any worsening or new symptoms.        FINAL CLINICAL IMPRESSION(S) / ED DIAGNOSES   Final diagnoses:  Fall, initial encounter  Contusion of left hip, initial encounter  Contusion of left elbow, initial encounter     Rx / DC Orders   ED Discharge  Orders          Ordered    ketorolac (TORADOL) 10 MG tablet  Every 6 hours PRN        11/17/21 2035    predniSONE (DELTASONE) 50 MG tablet  Daily with breakfast        11/17/21 2035    methocarbamol (ROBAXIN) 500 MG tablet  4 times daily        11/17/21 2035             Note:  This document was prepared using Dragon voice recognition software  and may include unintentional dictation errors.   Lanette Hampshire 11/17/21 2108    Shaune Pollack, MD 11/17/21 [phone]

## 2021-12-06 ENCOUNTER — Other Ambulatory Visit: Payer: Self-pay

## 2021-12-06 ENCOUNTER — Emergency Department: Payer: BC Managed Care – PPO

## 2021-12-06 ENCOUNTER — Inpatient Hospital Stay
Admission: EM | Admit: 2021-12-06 | Discharge: 2021-12-08 | DRG: 871 | Disposition: A | Discharge status: Home / Self Care | Payer: BC Managed Care – PPO | Attending: Internal Medicine | Admitting: Internal Medicine

## 2021-12-06 DIAGNOSIS — Z90710 Acquired absence of both cervix and uterus: Secondary | ICD-10-CM | POA: Diagnosis not present

## 2021-12-06 DIAGNOSIS — Z9851 Tubal ligation status: Secondary | ICD-10-CM | POA: Diagnosis not present

## 2021-12-06 DIAGNOSIS — Z880 Allergy status to penicillin: Secondary | ICD-10-CM | POA: Diagnosis not present

## 2021-12-06 DIAGNOSIS — F4310 Post-traumatic stress disorder, unspecified: Secondary | ICD-10-CM | POA: Diagnosis present

## 2021-12-06 DIAGNOSIS — J189 Pneumonia, unspecified organism: Secondary | ICD-10-CM | POA: Diagnosis present

## 2021-12-06 DIAGNOSIS — Z20822 Contact with and (suspected) exposure to covid-19: Secondary | ICD-10-CM | POA: Diagnosis present

## 2021-12-06 DIAGNOSIS — A419 Sepsis, unspecified organism: Principal | ICD-10-CM | POA: Diagnosis present

## 2021-12-06 DIAGNOSIS — R7989 Other specified abnormal findings of blood chemistry: Secondary | ICD-10-CM

## 2021-12-06 DIAGNOSIS — Z9049 Acquired absence of other specified parts of digestive tract: Secondary | ICD-10-CM

## 2021-12-06 DIAGNOSIS — R55 Syncope and collapse: Principal | ICD-10-CM

## 2021-12-06 DIAGNOSIS — K529 Noninfective gastroenteritis and colitis, unspecified: Secondary | ICD-10-CM | POA: Diagnosis present

## 2021-12-06 DIAGNOSIS — Z79899 Other long term (current) drug therapy: Secondary | ICD-10-CM

## 2021-12-06 DIAGNOSIS — Z8674 Personal history of sudden cardiac arrest: Secondary | ICD-10-CM | POA: Diagnosis not present

## 2021-12-06 DIAGNOSIS — Z888 Allergy status to other drugs, medicaments and biological substances status: Secondary | ICD-10-CM | POA: Diagnosis not present

## 2021-12-06 DIAGNOSIS — R000 Tachycardia, unspecified: Secondary | ICD-10-CM

## 2021-12-06 DIAGNOSIS — F32A Depression, unspecified: Secondary | ICD-10-CM | POA: Diagnosis present

## 2021-12-06 DIAGNOSIS — G894 Chronic pain syndrome: Secondary | ICD-10-CM | POA: Diagnosis present

## 2021-12-06 DIAGNOSIS — F251 Schizoaffective disorder, depressive type: Secondary | ICD-10-CM | POA: Diagnosis present

## 2021-12-06 DIAGNOSIS — Z91040 Latex allergy status: Secondary | ICD-10-CM

## 2021-12-06 DIAGNOSIS — Z6841 Body Mass Index (BMI) 40.0 and over, adult: Secondary | ICD-10-CM

## 2021-12-06 DIAGNOSIS — G932 Benign intracranial hypertension: Secondary | ICD-10-CM | POA: Diagnosis present

## 2021-12-06 DIAGNOSIS — I10 Essential (primary) hypertension: Secondary | ICD-10-CM | POA: Diagnosis present

## 2021-12-06 DIAGNOSIS — M5136 Other intervertebral disc degeneration, lumbar region: Secondary | ICD-10-CM | POA: Diagnosis present

## 2021-12-06 DIAGNOSIS — Z9151 Personal history of suicidal behavior: Secondary | ICD-10-CM | POA: Diagnosis not present

## 2021-12-06 DIAGNOSIS — R6510 Systemic inflammatory response syndrome (SIRS) of non-infectious origin without acute organ dysfunction: Secondary | ICD-10-CM

## 2021-12-06 LAB — BLOOD GAS, VENOUS
Acid-Base Excess: 1.7 mmol/L (ref 0.0–2.0)
Bicarbonate: 25.8 mmol/L (ref 20.0–28.0)
O2 Saturation: 74.9 %
Patient temperature: 37
pCO2, Ven: 38 mmHg — ABNORMAL LOW (ref 44–60)
pH, Ven: 7.44 — ABNORMAL HIGH (ref 7.25–7.43)
pO2, Ven: 44 mmHg (ref 32–45)

## 2021-12-06 LAB — CBC WITH DIFFERENTIAL/PLATELET
Abs Immature Granulocytes: 0.06 10*3/uL (ref 0.00–0.07)
Basophils Absolute: 0.1 10*3/uL (ref 0.0–0.1)
Basophils Relative: 1 %
Eosinophils Absolute: 0.3 10*3/uL (ref 0.0–0.5)
Eosinophils Relative: 5 %
HCT: 40.2 % (ref 36.0–46.0)
Hemoglobin: 12.6 g/dL (ref 12.0–15.0)
Immature Granulocytes: 1 %
Lymphocytes Relative: 26 %
Lymphs Abs: 1.7 10*3/uL (ref 0.7–4.0)
MCH: 29.7 pg (ref 26.0–34.0)
MCHC: 31.3 g/dL (ref 30.0–36.0)
MCV: 94.8 fL (ref 80.0–100.0)
Monocytes Absolute: 0.4 10*3/uL (ref 0.1–1.0)
Monocytes Relative: 7 %
Neutro Abs: 4.1 10*3/uL (ref 1.7–7.7)
Neutrophils Relative %: 60 %
Platelets: 221 10*3/uL (ref 150–400)
RBC: 4.24 MIL/uL (ref 3.87–5.11)
RDW: 14.6 % (ref 11.5–15.5)
WBC: 6.7 10*3/uL (ref 4.0–10.5)
nRBC: 0 % (ref 0.0–0.2)

## 2021-12-06 LAB — COMPREHENSIVE METABOLIC PANEL
ALT: 45 U/L — ABNORMAL HIGH (ref 0–44)
AST: 54 U/L — ABNORMAL HIGH (ref 15–41)
Albumin: 3.5 g/dL (ref 3.5–5.0)
Alkaline Phosphatase: 120 U/L (ref 38–126)
Anion gap: 6 (ref 5–15)
BUN: 6 mg/dL (ref 6–20)
CO2: 27 mmol/L (ref 22–32)
Calcium: 9.1 mg/dL (ref 8.9–10.3)
Chloride: 110 mmol/L (ref 98–111)
Creatinine, Ser: 0.83 mg/dL (ref 0.44–1.00)
GFR, Estimated: >60 mL/min (ref >60)
Glucose, Bld: 102 mg/dL — ABNORMAL HIGH (ref 70–99)
Potassium: 4.5 mmol/L (ref 3.5–5.1)
Sodium: 143 mmol/L (ref 135–145)
Total Bilirubin: 1.3 mg/dL — ABNORMAL HIGH (ref 0.3–1.2)
Total Protein: 7.1 g/dL (ref 6.5–8.1)

## 2021-12-06 LAB — URINALYSIS, ROUTINE W REFLEX MICROSCOPIC
Bacteria, UA: NONE SEEN
Bilirubin Urine: NEGATIVE
Glucose, UA: NEGATIVE mg/dL
Hgb urine dipstick: NEGATIVE
Ketones, ur: 5 mg/dL — AB
Nitrite: NEGATIVE
Protein, ur: NEGATIVE mg/dL
Specific Gravity, Urine: 1.019 (ref 1.005–1.030)
pH: 7 (ref 5.0–8.0)

## 2021-12-06 LAB — TROPONIN I (HIGH SENSITIVITY)
Troponin I (High Sensitivity): 4 ng/L (ref <18)
Troponin I (High Sensitivity): 2 ng/L (ref <18)

## 2021-12-06 LAB — SARS CORONAVIRUS 2 BY RT PCR: SARS Coronavirus 2 by RT PCR: NEGATIVE

## 2021-12-06 LAB — D-DIMER, QUANTITATIVE: D-Dimer, Quant: 0.57 ug/mL-FEU — ABNORMAL HIGH (ref 0.00–0.50)

## 2021-12-06 LAB — LACTIC ACID, PLASMA: Lactic Acid, Venous: 1.4 mmol/L (ref 0.5–1.9)

## 2021-12-06 LAB — PROCALCITONIN: Procalcitonin: <0.1 ng/mL

## 2021-12-06 MED ORDER — LEVOFLOXACIN IN D5W 500 MG/100ML IV SOLN
500.0000 mg | Freq: Once | INTRAVENOUS | Status: AC
  Administered 2021-12-06: 500 mg via INTRAVENOUS
  Filled 2021-12-06: qty 100

## 2021-12-06 MED ORDER — IOHEXOL 350 MG/ML SOLN
80.0000 mL | Freq: Once | INTRAVENOUS | Status: AC | PRN
  Administered 2021-12-06: 80 mL via INTRAVENOUS

## 2021-12-06 MED ORDER — HYDROMORPHONE HCL 1 MG/ML IJ SOLN
0.5000 mg | Freq: Once | INTRAMUSCULAR | Status: AC
  Administered 2021-12-06: 0.5 mg via INTRAVENOUS
  Filled 2021-12-06: qty 0.5

## 2021-12-06 MED ORDER — LACTATED RINGERS IV BOLUS
1000.0000 mL | Freq: Once | INTRAVENOUS | Status: AC
  Administered 2021-12-06: 1000 mL via INTRAVENOUS

## 2021-12-06 NOTE — ED Notes (Signed)
Portable Xray at bedside.

## 2021-12-06 NOTE — ED Notes (Signed)
This RN called to room by patient reporting "can't breathe." Pt reports increase in anxiety and pain. Pt reports last dose of pain and anxiety meds at home was at 0730, normally takes 3X daily. Pt also requesting something for nausea. Verbal order received from Dr. Darnelle Catalan for on time dose 10 mg compazine IV and 0.5 mg Dilaudid IV.

## 2021-12-06 NOTE — H&P (Signed)
History and Physical    Patient: Audrey [name] YHC:[id number] DOB: Dec 26, 1975 DOA: 12/06/2021 DOS: the patient was seen and examined on 12/06/2021 PCP: Nathaneil Canary, PA-C  Patient coming from: Home  Chief Complaint:  Chief Complaint  Patient presents with   Chest Pain   Loss of Consciousness    HPI: Audrey [name] is a 46 y.o. female with medical history significant for Pseudotumor cerebri, anxiety, depression, PTSD,, schizoaffective disorder, and chronic opioid use, hospitalized from 5/6 to 5/17 with stress cardiomyopathy secondary to out of hospital cardiac arrest from intentional opioid overdose with improvement in EF from 30 to 35% on 5/7 to 50 to 55% by discharge on 5/12, who presents to the ED by EMS with complaints of chest pain, shortness of breath and 5 syncopal episodes. Over the past four days she has had a cough and fever as well as non bloody and non bilious vomiting as well as non bloody diarrhea and colicky abdominal pain with the diarrhea.She also stated she tested positive for COVID 5 days prior.   ED course and data review: Pulse 156 and respirations 25 on arrival with otherwise normal vitals.  Troponin 2, D-dimer 0.57, BNP not done.  WBC and rest of CBC normal.  VBG with pH 7.44 but otherwise unremarkable.  CMP with slight elevation in transaminases with AST 54 and ALT 45 and total bilirubin 1.3.  Procalcitonin pending, urinalysis unremarkable.  EKG showing sinus tachycardia at 150 with frequent PVCs and nonspecific ST-T wave changes CTA chest shows no evidence of PE but shows left upper lobe pneumonia and trace left pleural effusion. Patient started on Levaquin, given a 2 L fluid bolus, given hydromorphone for pain and hospitalist consulted for admission. Heart rate did improve to 96 just prior to admission.      Past Medical History:  Diagnosis Date   Anxiety    Arthritis    DDD (degenerative disc disease), lumbar 12/10/2020   Depression    Heart murmur     Pseudotumor cerebri    PTSD (post-traumatic stress disorder)    Schizo affective schizophrenia (HCC)    Sciatica    Past Surgical History:  Procedure Laterality Date   ABDOMINAL HYSTERECTOMY     ABDOMINAL SURGERY     APPENDECTOMY     CARDIAC CATHETERIZATION     CHOLECYSTECTOMY     KNEE SURGERY     TONSILLECTOMY     TUBAL LIGATION     Social History:  reports that she has never smoked. She has never used smokeless tobacco. She reports that she does not drink alcohol and does not use drugs.  Allergies  Allergen Reactions   Penicillins Anaphylaxis and Rash   Prochlorperazine Anaphylaxis and Nausea And Vomiting   Propoxyphene Nausea And Vomiting and Other (See Comments)   Acetazolamide Other (See Comments)    Joint pain   Latex Hives and Swelling   Nitroglycerin Er Swelling and Other (See Comments)    Body swells, but no breathing issues    Family History  Problem Relation Age of Onset   Bipolar disorder Mother    Schizophrenia Sister    Schizophrenia Sister    Anxiety disorder Other    Depression Other    Suicidality Neg Hx     Prior to Admission medications   Medication Sig Start Date End Date Taking? Authorizing Provider  clonazePAM (KLONOPIN) 1 MG tablet Take 1 mg by mouth 2 (two) times daily.   Yes [provider]  DULoxetine (CYMBALTA)  60 MG capsule Take 1 capsule (60 mg total) by mouth daily. 08/16/21  Yes Doda, Traci Sermon, MD  gabapentin (NEURONTIN) 800 MG tablet Take 800 mg by mouth 3 (three) times daily. 11/04/21  Yes [provider]  HYDROmorphone (DILAUDID) 2 MG tablet Take 2 mg by mouth 2 (two) times daily as needed for severe pain.   Yes [provider]  losartan (COZAAR) 50 MG tablet Take 50 mg by mouth daily. 10/01/21  Yes [provider]  mirtazapine (REMERON SOL-TAB) 15 MG disintegrating tablet Take 15 mg by mouth at bedtime. 11/04/21  Yes [provider]  QUEtiapine (SEROQUEL) 100 MG tablet Take 100 mg by mouth 3  (three) times daily. 11/04/21  Yes [provider]  tiZANidine (ZANAFLEX) 2 MG tablet Take 1 tablet (2 mg total) by mouth 2 (two) times daily. 08/13/21  Yes Azucena Fallen, MD  albuterol (VENTOLIN HFA) 108 (90 Base) MCG/ACT inhaler Inhale 2 puffs into the lungs every 6 (six) hours as needed for wheezing or shortness of breath.    [provider]  docusate sodium (COLACE) 100 MG capsule Take 1 capsule (100 mg total) by mouth 2 (two) times daily as needed for mild constipation. 08/16/21   Karsten Ro, MD  ketorolac (TORADOL) 10 MG tablet Take 1 tablet (10 mg total) by mouth every 6 (six) hours as needed. Patient not taking: Reported on 12/06/2021 11/17/21   Cuthriell, Delorise Royals, PA-C  losartan (COZAAR) 25 MG tablet Take 1 tablet (25 mg total) by mouth daily. Patient not taking: Reported on 12/06/2021 08/14/21   Azucena Fallen, MD  methocarbamol (ROBAXIN) 500 MG tablet Take 1 tablet (500 mg total) by mouth 4 (four) times daily. Patient not taking: Reported on 12/06/2021 11/17/21   Cuthriell, Delorise Royals, PA-C  metoprolol succinate (TOPROL-XL) 25 MG 24 hr tablet Take 0.5 tablets (12.5 mg total) by mouth daily. Patient not taking: Reported on 12/06/2021 08/14/21   Azucena Fallen, MD  Multiple Vitamin (MULTIVITAMIN WITH MINERALS) TABS tablet Take 1 tablet by mouth daily. Patient not taking: Reported on 12/06/2021 08/14/21   Azucena Fallen, MD  mupirocin ointment (BACTROBAN) 2 % Apply topically 2 (two) times daily. Patient not taking: Reported on 12/06/2021 08/13/21   Azucena Fallen, MD  naloxone [facility]) nasal spray 4 mg/0.1 mL Place 1 spray into the nose once as needed (for accidental overdose). 02/26/21   [provider]  pantoprazole (PROTONIX) 40 MG tablet Take 1 tablet (40 mg total) by mouth daily. Patient not taking: Reported on 12/06/2021 08/14/21   Azucena Fallen, MD  potassium chloride SA (KLOR-CON M) 20 MEQ tablet Take 1 tablet (20 mEq total) by mouth 2  (two) times daily. Patient not taking: Reported on 12/06/2021 08/13/21   Azucena Fallen, MD  predniSONE (DELTASONE) 50 MG tablet Take 1 tablet (50 mg total) by mouth daily with breakfast. Patient not taking: Reported on 12/06/2021 11/17/21   Cuthriell, Delorise Royals, PA-C  vitamin B-12 1000 MCG tablet Take 1 tablet (1,000 mcg total) by mouth daily. Patient not taking: Reported on 12/06/2021 08/14/21   Azucena Fallen, MD  Vitamin D, Ergocalciferol, (DRISDOL) 1.25 MG (50000 UNIT) CAPS capsule Take 1 capsule (50,000 Units total) by mouth every 7 (seven) days. Patient not taking: Reported on 12/06/2021 08/22/21   Karsten Ro, MD    Physical Exam: Vitals:   12/06/21 2214 12/06/21 2215 12/06/21 2218 12/06/21 2309  BP: 95/79 (!) 142/102 (!) 146/102   Pulse: (!) 102 96 Marland Kitchen)  128   Resp: 15 20 (!) 26   Temp:    98 F (36.7 C)  TempSrc:    Oral  SpO2: 96% 95% 94%    Physical Exam Vitals and nursing note reviewed.  Constitutional:      General: She is not in acute distress. HENT:     Head: Normocephalic and atraumatic.  Cardiovascular:     Rate and Rhythm: Regular rhythm. Tachycardia present.     Heart sounds: Normal heart sounds.  Pulmonary:     Effort: Pulmonary effort is normal.     Breath sounds: Normal breath sounds.  Abdominal:     Palpations: Abdomen is soft.     Tenderness: There is no abdominal tenderness.  Neurological:     Mental Status: Mental status is at baseline.     Labs on Admission: I have personally reviewed following labs and imaging studies  CBC: Recent Labs  Lab 12/06/21 1608  WBC 6.7  NEUTROABS 4.1  HGB 12.6  HCT 40.2  MCV 94.8  PLT 221   Basic Metabolic Panel: Recent Labs  Lab 12/06/21 1640  NA 143  K 4.5  CL 110  CO2 27  GLUCOSE 102*  BUN 6  CREATININE 0.83  CALCIUM 9.1   GFR: CrCl cannot be calculated (Unknown ideal weight.). Liver Function Tests: Recent Labs  Lab 12/06/21 1640  AST 54*  ALT 45*  ALKPHOS 120  BILITOT 1.3*  PROT  7.1  ALBUMIN 3.5   No results for input(s): "LIPASE", "AMYLASE" in the last 168 hours. No results for input(s): "AMMONIA" in the last 168 hours. Coagulation Profile: No results for input(s): "INR", "PROTIME" in the last 168 hours. Cardiac Enzymes: No results for input(s): "CKTOTAL", "CKMB", "CKMBINDEX", "TROPONINI" in the last 168 hours. BNP (last 3 results) No results for input(s): "PROBNP" in the last 8760 hours. HbA1C: No results for input(s): "HGBA1C" in the last 72 hours. CBG: No results for input(s): "GLUCAP" in the last 168 hours. Lipid Profile: No results for input(s): "CHOL", "HDL", "LDLCALC", "TRIG", "CHOLHDL", "LDLDIRECT" in the last 72 hours. Thyroid Function Tests: No results for input(s): "TSH", "T4TOTAL", "FREET4", "T3FREE", "THYROIDAB" in the last 72 hours. Anemia Panel: No results for input(s): "VITAMINB12", "FOLATE", "FERRITIN", "TIBC", "IRON", "RETICCTPCT" in the last 72 hours. Urine analysis:    Component Value Date/Time   COLORURINE YELLOW (A) 12/06/2021 1940   APPEARANCEUR CLEAR (A) 12/06/2021 1940   LABSPEC 1.019 12/06/2021 1940   PHURINE 7.0 12/06/2021 1940   GLUCOSEU NEGATIVE 12/06/2021 1940   HGBUR NEGATIVE 12/06/2021 1940   BILIRUBINUR NEGATIVE 12/06/2021 1940   KETONESUR 5 (A) 12/06/2021 1940   PROTEINUR NEGATIVE 12/06/2021 1940   NITRITE NEGATIVE 12/06/2021 1940   LEUKOCYTESUR SMALL (A) 12/06/2021 1940    Radiological Exams on Admission: CT Angio Chest PE W and/or Wo Contrast  Result Date: 12/06/2021 CLINICAL DATA:  Chest pain, syncope, elevated D-dimer EXAM: CT ANGIOGRAPHY CHEST WITH CONTRAST TECHNIQUE: Multidetector CT imaging of the chest was performed using the standard protocol during bolus administration of intravenous contrast. Multiplanar CT image reconstructions and MIPs were obtained to evaluate the vascular anatomy. RADIATION DOSE REDUCTION: This exam was performed according to the departmental dose-optimization program which includes  automated exposure control, adjustment of the mA and/or kV according to patient size and/or use of iterative reconstruction technique. CONTRAST:  54mL OMNIPAQUE IOHEXOL 350 MG/ML SOLN COMPARISON:  Chest radiograph dated 12/06/2021. CTA chest dated 12/02/2020. FINDINGS: Cardiovascular: Satisfactory opacification of the pulmonary arteries to the segmental level. No evidence  of pulmonary embolism. Although not tailored for evaluation of the thoracic aorta, there is no evidence of thoracic aortic aneurysm or dissection. The heart is top-normal in size.  No pericardial effusion. Mediastinum/Nodes: No suspicious mediastinal lymphadenopathy. Visualized thyroid is unremarkable. Lungs/Pleura: Mild patchy left upper lobe and lingular opacities, suspicious for pneumonia. Mild mosaic attenuation in the lungs bilaterally. No suspicious pulmonary nodules. Trace left pleural effusion. No pneumothorax. Upper Abdomen: Visualized upper abdomen is notable for moderate hepatic steatosis and prior cholecystectomy. Musculoskeletal: Visualized osseous structures are within normal limits. Review of the MIP images confirms the above findings. IMPRESSION: No evidence of pulmonary embolism. Left upper lobe pneumonia.  Trace left pleural effusion. Electronically Signed   By: Charline Bills M.D.   On: 12/06/2021 19:40   DG Chest Portable 1 View  Result Date: 12/06/2021 CLINICAL DATA:  Chest pain and syncope beginning today. COVID positive. EXAM: PORTABLE CHEST 1 VIEW COMPARISON:  08/12/2021 FINDINGS: Lungs are hypoinflated without focal airspace consolidation or effusion. Cardiomediastinal silhouette and remainder of the exam is unchanged. IMPRESSION: Hypoinflation without acute cardiopulmonary disease. Electronically Signed   By: Elberta Fortis M.D.   On: 12/06/2021 16:20     Data Reviewed: Relevant notes from primary care and specialist visits, past discharge summaries as available in EHR, including Care Everywhere. Prior diagnostic  testing as pertinent to current admission diagnoses Updated medications and problem lists for reconciliation ED course, including vitals, labs, imaging, treatment and response to treatment Triage notes, nursing and pharmacy notes and ED provider's notes Notable results as noted in HPI   Assessment and Plan: * Syncopal episodes, suspect vasovagal Sinus tachyarrhythmia  History of cardiac arrest and stress cardiomyopathy 07/2021 (EF 50 to 55% 5/12, improved from 30 to 35% 5/7) Continuous cardiac monitoring to evaluate for arrhythmias IV metoprolol for rate control We will repeat echocardiogram to evaluate for cardiomyopathy UDS Neurochecks Consider cardiology consult if still tachycardic  Acute gastroenteritis Clear liquids as tolerated IV hydration, antiemetics and supprtive care GI panel and stool   Pneumonia SIRS, possible sepsis Patient with cough and fever, tachycardia and tachypnea but with normal WBC.  Procalcitonin and lactic acid pending COVID-negative.  If procalcitonin less than 0.1 we will get respiratory viral panel Continue IV fluids hydration  Tachyarrhythmia Likely  Related to SIRS /sepsis Management as above  Elevated LFTs ST 54 and ALT 45 and total bilirubin 1.3 Unclear etiology, possible sepsis Monitor for movement with fluids.  Further diagnostic evaluation if uptrending  Chronic pain syndrome Polypharmacy with multiple sedating agents Schizoaffective disorder Patient on multiple psychotropics some sedating including Klonopin, gabapentin, hydromorphone, mirtazapine and tizanidine Hold off on sedating agents if possible given syncopal episodes  Schizoaffective disorder, depressive type (HCC) Continued duloxetine  Essential hypertension Continue losartan        DVT prophylaxis: Lovenox  Consults: none  Advance Care Planning:   Code Status: Prior   Family Communication: none  Disposition Plan: Back to previous home environment  Severity  of Illness: The appropriate patient status for this patient is INPATIENT. Inpatient status is judged to be reasonable and necessary in order to provide the required intensity of service to ensure the patient's safety. The patient's presenting symptoms, physical exam findings, and initial radiographic and laboratory data in the context of their chronic comorbidities is felt to place them at high risk for further clinical deterioration. Furthermore, it is not anticipated that the patient will be medically stable for discharge from the hospital within 2 midnights of admission.   * I  certify that at the point of admission it is my clinical judgment that the patient will require inpatient hospital care spanning beyond 2 midnights from the point of admission due to high intensity of service, high risk for further deterioration and high frequency of surveillance required.*  Author: Andris BaumannHazel V Opie Fanton, MD 12/06/2021 11:12 PM  For on call review [email or web address].

## 2021-12-06 NOTE — ED Notes (Signed)
Dilaudid wasted in Pyxis with Barbee Cough, Charity fundraiser by accident. 0.5 mg dilaudid given as ordered, no wasted.

## 2021-12-06 NOTE — ED Notes (Signed)
Dr. Darnelle Catalan notified of allergy to compazine. Per Dr. Darnelle Catalan, give dilaudid then reassess.

## 2021-12-06 NOTE — ED Provider Notes (Signed)
[facility] Provider Note    Event Date/Time   First MD Initiated Contact with Patient 12/06/21 1531     (approximate)   History   Chest Pain and Loss of Consciousness   HPI  Audrey [name] is a 46 y.o. female who reports she tested positive for COVID 4 days ago.  She is short of breath.  She reports her heart is racing.  She says it was positive for 5 seconds or so and start with a big heavy beat that makes her ache more.  She says she is aching all over.  She is weak and lightheaded and has passed out 4 times today.  On arrival here her heart rate was 156. Patient had CPR done in May which was obviously successful.  Please see May chart for further details.      Physical Exam   Triage Vital Signs: ED Triage Vitals  Enc Vitals Group     BP 12/06/21 1517 111/80     Pulse Rate 12/06/21 1517 (!) 156     Resp 12/06/21 1517 (!) 25     Temp 12/06/21 1517 98.4 F (36.9 C)     Temp Source 12/06/21 1517 Oral     SpO2 12/06/21 1517 95 %     Weight --      Height --      Head Circumference --      Peak Flow --      Pain Score 12/06/21 1518 8     Pain Loc --      Pain Edu? --      Excl. in GC? --     Most recent vital signs: Vitals:   12/07/21 0039 12/07/21 0100  BP: 129/86 (!) 122/59  Pulse: 94 (!) 101  Resp: (!) 24 (!) 22  Temp:    SpO2: 94% 93%     General: Awake, looks weak and tired CV:  Good peripheral perfusion.  Heart regular rate and rhythm no audible murmurs but tacky Resp:  Normal effort.  Lungs are clear Abd:  No distention.  Soft and nontender Extremities: No edema   ED Results / Procedures / Treatments   Labs (all labs ordered are listed, but only abnormal results are displayed) Labs Reviewed  URINALYSIS, ROUTINE W REFLEX MICROSCOPIC - Abnormal; Notable for the following components:      Result Value   Color, Urine YELLOW (*)    APPearance CLEAR (*)    Ketones, ur 5 (*)    Leukocytes,Ua SMALL (*)    All other  components within normal limits  BLOOD GAS, VENOUS - Abnormal; Notable for the following components:   pH, Ven 7.44 (*)    pCO2, Ven 38 (*)    All other components within normal limits  D-DIMER, QUANTITATIVE (NOT AT [facility]) - Abnormal; Notable for the following components:   D-Dimer, Quant 0.57 (*)    All other components within normal limits  COMPREHENSIVE METABOLIC PANEL - Abnormal; Notable for the following components:   Glucose, Bld 102 (*)    AST 54 (*)    ALT 45 (*)    Total Bilirubin 1.3 (*)    All other components within normal limits  SARS CORONAVIRUS 2 BY RT PCR  CULTURE, BLOOD (ROUTINE X 2)  CULTURE, BLOOD (ROUTINE X 2)  GASTROINTESTINAL PANEL BY PCR, STOOL (REPLACES STOOL CULTURE)  RESPIRATORY PANEL BY PCR  CBC WITH DIFFERENTIAL/PLATELET  LACTIC ACID, PLASMA  PROCALCITONIN  HIV ANTIBODY (ROUTINE TESTING W  REFLEX)  CBC  CREATININE, SERUM  TROPONIN I (HIGH SENSITIVITY)  TROPONIN I (HIGH SENSITIVITY)     EKG  EKG read and interpreted by me shows sinus tachycardia 132 extreme rightward axis which is new for the patient no obvious acute ST-T wave changes there is a lot of artifact however   RADIOLOGY    PROCEDURES:  Critical Care performed:   Procedures   MEDICATIONS ORDERED IN ED: Medications  metoprolol tartrate (LOPRESSOR) injection 2.5 mg (2.5 mg Intravenous Given 12/07/21 0135)  DULoxetine (CYMBALTA) DR capsule 60 mg (has no administration in time range)  QUEtiapine (SEROQUEL) tablet 100 mg (has no administration in time range)  sodium chloride flush (NS) 0.9 % injection 3 mL (3 mLs Intravenous Not Given 12/07/21 0139)  enoxaparin (LOVENOX) injection 62.5 mg (has no administration in time range)  0.9 %  sodium chloride infusion ( Intravenous New Bag/Given 12/07/21 0135)  acetaminophen (TYLENOL) tablet 650 mg (has no administration in time range)    Or  acetaminophen (TYLENOL) suppository 650 mg (has no administration in time range)  ondansetron (ZOFRAN)  tablet 4 mg (has no administration in time range)    Or  ondansetron (ZOFRAN) injection 4 mg (has no administration in time range)  cefTRIAXone (ROCEPHIN) 2 g in sodium chloride 0.9 % 100 mL IVPB (2 g Intravenous New Bag/Given 12/07/21 0132)  azithromycin (ZITHROMAX) 500 mg in sodium chloride 0.9 % 250 mL IVPB (has no administration in time range)  HYDROmorphone (DILAUDID) injection 0.5 mg (0.5 mg Intravenous Given 12/06/21 1754)  lactated ringers bolus 1,000 mL (0 mLs Intravenous Stopped 12/06/21 2213)  iohexol (OMNIPAQUE) 350 MG/ML injection 80 mL (80 mLs Intravenous Contrast Given 12/06/21 1910)  HYDROmorphone (DILAUDID) injection 0.5 mg (0.5 mg Intravenous Given 12/06/21 2024)  lactated ringers bolus 1,000 mL (0 mLs Intravenous Stopped 12/07/21 0033)  levofloxacin (LEVAQUIN) IVPB 500 mg (0 mg Intravenous Stopped 12/07/21 0033)     IMPRESSION / MDM / ASSESSMENT AND PLAN / ED COURSE  I reviewed the triage vital signs and the nursing notes. Patient came in very tachycardic saying she passed out 4 times at home.  D-dimer was mildly elevated but CT did not show anything except for some left upper lobe pneumonia.  COVID reportedly positive at home but negative here.  Patient does not have a white count.  Procalcitonin is negative.  Troponin is negative.  Patient however even after IV fluids remains very tachycardic and woozy when she stands up.  We will get her in the hospital give more fluids and see if we can evaluate her further.  There is no sign of any PE.  Differential diagnosis includes, but is not limited to, false negative COVID test bacterial pneumonia PE was in the differential but is not female currently.  Dehydration is a possibility  Patient's presentation is most consistent with acute complicated illness / injury requiring diagnostic workup.  The patient is on the cardiac monitor to evaluate for evidence of arrhythmia and/or significant heart rate changes.  None have been seen      FINAL  CLINICAL IMPRESSION(S) / ED DIAGNOSES   Final diagnoses:  Syncope and collapse  Syncope, unspecified syncope type  Tachycardia  Pneumonia of left lung due to infectious organism, unspecified part of lung     Rx / DC Orders   ED Discharge Orders     None        Note:  This document was prepared using Dragon voice recognition software and may include unintentional dictation  errors.   Arnaldo Natal, MD 12/07/21 [phone]

## 2021-12-06 NOTE — ED Triage Notes (Signed)
Pt presents to ED with c/o of CP and syncope that started today.  Pt COVID +.

## 2021-12-06 NOTE — ED Notes (Signed)
CMP and D-dimer recollected d/t first set hemolyzing per lab.

## 2021-12-07 ENCOUNTER — Inpatient Hospital Stay (HOSPITAL_COMMUNITY)
Admit: 2021-12-07 | Discharge: 2021-12-07 | Disposition: A | Discharge status: Home / Self Care | Payer: BC Managed Care – PPO | Attending: Internal Medicine | Admitting: Internal Medicine

## 2021-12-07 ENCOUNTER — Encounter: Payer: Self-pay | Admitting: Internal Medicine

## 2021-12-07 DIAGNOSIS — F251 Schizoaffective disorder, depressive type: Secondary | ICD-10-CM | POA: Diagnosis not present

## 2021-12-07 DIAGNOSIS — J189 Pneumonia, unspecified organism: Secondary | ICD-10-CM

## 2021-12-07 DIAGNOSIS — K529 Noninfective gastroenteritis and colitis, unspecified: Secondary | ICD-10-CM

## 2021-12-07 DIAGNOSIS — R55 Syncope and collapse: Secondary | ICD-10-CM

## 2021-12-07 LAB — CBC
HCT: 35.6 % — ABNORMAL LOW (ref 36.0–46.0)
Hemoglobin: 11.2 g/dL — ABNORMAL LOW (ref 12.0–15.0)
MCH: 30.2 pg (ref 26.0–34.0)
MCHC: 31.5 g/dL (ref 30.0–36.0)
MCV: 96 fL (ref 80.0–100.0)
Platelets: 218 10*3/uL (ref 150–400)
RBC: 3.71 MIL/uL — ABNORMAL LOW (ref 3.87–5.11)
RDW: 14.6 % (ref 11.5–15.5)
WBC: 6 10*3/uL (ref 4.0–10.5)
nRBC: 0 % (ref 0.0–0.2)

## 2021-12-07 LAB — RESPIRATORY PANEL BY PCR

## 2021-12-07 LAB — CREATININE, SERUM
Creatinine, Ser: 0.78 mg/dL (ref 0.44–1.00)
GFR, Estimated: >60 mL/min (ref >60)

## 2021-12-07 MED ORDER — SODIUM CHLORIDE 0.9% FLUSH
3.0000 mL | Freq: Two times a day (BID) | INTRAVENOUS | Status: DC
  Administered 2021-12-07 – 2021-12-08 (×2): 3 mL via INTRAVENOUS

## 2021-12-07 MED ORDER — SODIUM CHLORIDE 0.9 % IV SOLN
2.0000 g | Freq: Every day | INTRAVENOUS | Status: DC
  Administered 2021-12-07 – 2021-12-08 (×2): 2 g via INTRAVENOUS
  Filled 2021-12-07 (×2): qty 20

## 2021-12-07 MED ORDER — DULOXETINE HCL 30 MG PO CPEP
60.0000 mg | ORAL_CAPSULE | Freq: Every day | ORAL | Status: DC
  Administered 2021-12-07 – 2021-12-08 (×2): 60 mg via ORAL
  Filled 2021-12-07: qty 2
  Filled 2021-12-07: qty 1

## 2021-12-07 MED ORDER — HYDROMORPHONE HCL 2 MG PO TABS
1.0000 mg | ORAL_TABLET | ORAL | Status: DC | PRN
  Administered 2021-12-07: 1 mg via ORAL
  Filled 2021-12-07: qty 1

## 2021-12-07 MED ORDER — ACETAMINOPHEN 650 MG RE SUPP: 650.0000 mg | Freq: Four times a day (QID) | RECTAL | Status: DC | PRN

## 2021-12-07 MED ORDER — ACETAMINOPHEN 325 MG PO TABS: 650.0000 mg | ORAL_TABLET | Freq: Four times a day (QID) | ORAL | Status: DC | PRN

## 2021-12-07 MED ORDER — ENOXAPARIN SODIUM 80 MG/0.8ML IJ SOSY
0.5000 mg/kg | PREFILLED_SYRINGE | INTRAMUSCULAR | Status: DC
  Administered 2021-12-07 – 2021-12-08 (×2): 62.5 mg via SUBCUTANEOUS
  Filled 2021-12-07: qty 0.63
  Filled 2021-12-07: qty 0.8

## 2021-12-07 MED ORDER — KETOROLAC TROMETHAMINE 30 MG/ML IJ SOLN
30.0000 mg | Freq: Once | INTRAMUSCULAR | Status: AC
Start: 2021-12-07 — End: 2021-12-07
  Administered 2021-12-07: 30 mg via INTRAVENOUS
  Filled 2021-12-07: qty 1

## 2021-12-07 MED ORDER — IPRATROPIUM-ALBUTEROL 0.5-2.5 (3) MG/3ML IN SOLN: 3.0000 mL | Freq: Four times a day (QID) | RESPIRATORY_TRACT | Status: DC | PRN

## 2021-12-07 MED ORDER — PERFLUTREN LIPID MICROSPHERE
1.0000 mL | INTRAVENOUS | Status: AC | PRN
  Administered 2021-12-07: 4 mL via INTRAVENOUS

## 2021-12-07 MED ORDER — METOPROLOL TARTRATE 5 MG/5ML IV SOLN
2.5000 mg | Freq: Four times a day (QID) | INTRAVENOUS | Status: AC
  Administered 2021-12-07 (×2): 2.5 mg via INTRAVENOUS
  Filled 2021-12-07: qty 5

## 2021-12-07 MED ORDER — ONDANSETRON HCL 4 MG/2ML IJ SOLN: 4.0000 mg | Freq: Four times a day (QID) | INTRAMUSCULAR | Status: DC | PRN

## 2021-12-07 MED ORDER — HYDROMORPHONE HCL 2 MG PO TABS
1.0000 mg | ORAL_TABLET | Freq: Four times a day (QID) | ORAL | Status: DC | PRN
  Administered 2021-12-07 – 2021-12-08 (×3): 1 mg via ORAL
  Filled 2021-12-07 (×4): qty 1

## 2021-12-07 MED ORDER — SODIUM CHLORIDE 0.9 % IV SOLN: INTRAVENOUS | Status: AC

## 2021-12-07 MED ORDER — METOPROLOL SUCCINATE ER 25 MG PO TB24
12.5000 mg | ORAL_TABLET | Freq: Every day | ORAL | Status: DC
  Administered 2021-12-07 – 2021-12-08 (×2): 12.5 mg via ORAL
  Filled 2021-12-07: qty 1
  Filled 2021-12-07: qty 0.5

## 2021-12-07 MED ORDER — SODIUM CHLORIDE 0.9 % IV SOLN
500.0000 mg | INTRAVENOUS | Status: DC
  Administered 2021-12-07: 500 mg via INTRAVENOUS
  Filled 2021-12-07 (×2): qty 5

## 2021-12-07 MED ORDER — CLONAZEPAM 1 MG PO TABS
1.0000 mg | ORAL_TABLET | Freq: Two times a day (BID) | ORAL | Status: DC
Start: 2021-12-07 — End: 2021-12-08
  Administered 2021-12-07 – 2021-12-08 (×3): 1 mg via ORAL
  Filled 2021-12-07: qty 2
  Filled 2021-12-07 (×2): qty 1

## 2021-12-07 MED ORDER — QUETIAPINE FUMARATE 25 MG PO TABS
100.0000 mg | ORAL_TABLET | Freq: Three times a day (TID) | ORAL | Status: DC
  Administered 2021-12-07 – 2021-12-08 (×5): 100 mg via ORAL
  Filled 2021-12-07 (×5): qty 4

## 2021-12-07 MED ORDER — ONDANSETRON HCL 4 MG PO TABS: 4.0000 mg | ORAL_TABLET | Freq: Four times a day (QID) | ORAL | Status: DC | PRN

## 2021-12-07 NOTE — Assessment & Plan Note (Addendum)
SIRS, possible sepsis Patient with cough and fever, tachycardia and tachypnea but with normal WBC.  Procalcitonin and lactic acid pending COVID-negative.  If procalcitonin less than 0.1 we will get respiratory viral panel Continue IV fluids hydration

## 2021-12-07 NOTE — ED Notes (Signed)
First contact with the pt, SBAR report received from [place], Charity fundraiser. The pt has been moved to room 34A and placed on the cardiac monitor, MD,. Para March is at the bedside at this time. Close monitoring continued.

## 2021-12-07 NOTE — Progress Notes (Signed)
PHARMACIST - PHYSICIAN COMMUNICATION  CONCERNING:  Enoxaparin (Lovenox) for DVT Prophylaxis    RECOMMENDATION: Patient was prescribed enoxaprin 40mg  q24 hours for VTE prophylaxis.   Filed Weights   12/07/21 0119  Weight: 126.1 kg (278 lb)    Body mass index is 49.25 kg/m.  Estimated Creatinine Clearance: 108.3 mL/min (by C-G formula based on SCr of 0.83 mg/dL).   Based on [facility] policy patient is candidate for enoxaparin 0.5mg /kg TBW SQ every 24 hours based on BMI being >30.  DESCRIPTION: Pharmacy has adjusted enoxaparin dose per [facility] policy.  Patient is now receiving enoxaparin 0.5 mg/kg every 24 hours   [facility], PharmD, [facility] 12/07/2021 1:32 AM

## 2021-12-07 NOTE — Assessment & Plan Note (Signed)
Continue losartan. 

## 2021-12-07 NOTE — Assessment & Plan Note (Signed)
Clear liquids as tolerated IV hydration, antiemetics and supprtive care GI panel and stool

## 2021-12-07 NOTE — Assessment & Plan Note (Signed)
Likely  Related to SIRS /sepsis Management as above

## 2021-12-07 NOTE — Assessment & Plan Note (Signed)
ST 54 and ALT 45 and total bilirubin 1.3 Unclear etiology, possible sepsis Monitor for movement with fluids.  Further diagnostic evaluation if uptrending

## 2021-12-07 NOTE — Assessment & Plan Note (Signed)
Polypharmacy with multiple sedating agents Schizoaffective disorder Patient on multiple psychotropics some sedating including Klonopin, gabapentin, hydromorphone, mirtazapine and tizanidine Hold off on sedating agents if possible given syncopal episodes

## 2021-12-07 NOTE — ED Notes (Signed)
Pt asking for more pain medication, the pt has been educated on the process of medication orders once admitted to the hospital and acknowledged her understanding. MD. Para March, has been made aware of the pt's request for pain medication. Close monitoring continued.

## 2021-12-07 NOTE — Progress Notes (Signed)
PROGRESS NOTE    Audrey [name]  PVV:[id number] DOB: 01-Feb-1976 DOA: 12/06/2021 PCP: Sue Lush, PA-C    Assessment & Plan:   Principal Problem:   Syncopal episodes, suspect vasovagal Active Problems:   Pneumonia   History of cardiac arrest   Acute gastroenteritis   SIRS (systemic inflammatory response syndrome) (HCC)   Tachyarrhythmia   Elevated LFTs   Chronic pain syndrome   Pseudotumor cerebri   Essential hypertension   Schizoaffective disorder, depressive type ([employer])  Assessment and Plan: Syncopal episodes: etiology unclear, possible vasovagal. Hx of out of hospital cardiac arrest & found to have stress cardiomyopathy 5/23 ( EF 50-55% on 08/08/21 & improved from EF 30-35% on 08/03/21). Continue on tele. Orthostatics are neg. Echo ordered. Restart home dose of metoprolol as pt had stopped taking     Acute gastroenteritis: GI PCR panel is ordered but not collected yet. Zofran prn   Sepsis: met criteria w/ tachycardia, tachypnea & pneumonia. Continue on IV abxs. Blood cxs NGTD. Viral respiratory panel is pending    Pneumonia: left upper lobe as per CTA chest. Continue on IV rocephin, azithromycin, bronchodilators & encourage incentive spirometry. Procal < 0.10   Transamnititis: etiology unclear. Will continue to monitor    Chronic pain syndrome: has polypharmacy w/ multiple sedating meds. Holding home gabapentin, dilaudid, remeron, tizanidine    Schizoaffective disorder:  depressive type. Continued duloxetine   HTN: holding home dose of losartan   Anxiety: severity unknown. Restart home dose of klonopin    Morbid obesity: BMI 49.2. Complicates overall care & prognosis      DVT prophylaxis: lovenox  Code Status: full  Family Communication:  Disposition Plan: likely d//c back home   Level of care: Progressive  Status is: Inpatient Remains inpatient appropriate because: severity of illness    Consultants:    Procedures:  Antimicrobials: rocephin,  azithromycin   Subjective: Pt c/o anxiety   Objective: Vitals:   12/07/21 0039 12/07/21 0100 12/07/21 0119 12/07/21 0603  BP: 129/86 (!) 122/59  124/76  Pulse: 94 (!) 101  92  Resp: (!) 24 (!) 22  (!) 22  Temp:    98.6 F (37 C)  TempSrc:    Oral  SpO2: 94% 93%  96%  Weight:   126.1 kg   Height:   '5\' 3"'  (1.6 m)     Intake/Output Summary (Last 24 hours) at 12/07/2021 0835 Last data filed at 12/07/2021 0202 Gross per 24 hour  Intake 100 ml  Output --  Net 100 ml   Filed Weights   12/07/21 0119  Weight: 126.1 kg    Examination:  General exam: Appears calm and comfortable  Respiratory system: decreased breath sounds b/l  Cardiovascular system: S1 & S2 +. No rubs, gallops or clicks.  Gastrointestinal system: Abdomen is obese, soft and nontender.  Normal bowel sounds heard. Central nervous system: Alert and oriented. Moves all extremities Psychiatry: Judgement and insight appear normal. Flat mood and affect     Data Reviewed: I have personally reviewed following labs and imaging studies  CBC: Recent Labs  Lab 12/06/21 1608 12/07/21 0516  WBC 6.7 6.0  NEUTROABS 4.1  --   HGB 12.6 11.2*  HCT 40.2 35.6*  MCV 94.8 96.0  PLT 221 754   Basic Metabolic Panel: Recent Labs  Lab 12/06/21 1640 12/07/21 0516  NA 143  --   K 4.5  --   CL 110  --   CO2 27  --   GLUCOSE 102*  --  BUN 6  --   CREATININE 0.83 0.78  CALCIUM 9.1  --    GFR: Estimated Creatinine Clearance: 112.4 mL/min (by C-G formula based on SCr of 0.78 mg/dL). Liver Function Tests: Recent Labs  Lab 12/06/21 1640  AST 54*  ALT 45*  ALKPHOS 120  BILITOT 1.3*  PROT 7.1  ALBUMIN 3.5   No results for input(s): "LIPASE", "AMYLASE" in the last 168 hours. No results for input(s): "AMMONIA" in the last 168 hours. Coagulation Profile: No results for input(s): "INR", "PROTIME" in the last 168 hours. Cardiac Enzymes: No results for input(s): "CKTOTAL", "CKMB", "CKMBINDEX", "TROPONINI" in the last  168 hours. BNP (last 3 results) No results for input(s): "PROBNP" in the last 8760 hours. HbA1C: No results for input(s): "HGBA1C" in the last 72 hours. CBG: No results for input(s): "GLUCAP" in the last 168 hours. Lipid Profile: No results for input(s): "CHOL", "HDL", "LDLCALC", "TRIG", "CHOLHDL", "LDLDIRECT" in the last 72 hours. Thyroid Function Tests: No results for input(s): "TSH", "T4TOTAL", "FREET4", "T3FREE", "THYROIDAB" in the last 72 hours. Anemia Panel: No results for input(s): "VITAMINB12", "FOLATE", "FERRITIN", "TIBC", "IRON", "RETICCTPCT" in the last 72 hours. Sepsis Labs: Recent Labs  Lab 12/06/21 1608 12/06/21 1736  PROCALCITON  --  <0.10  LATICACIDVEN 1.4  --     Recent Results (from the past 240 hour(s))  SARS Coronavirus 2 by RT PCR (hospital order, performed in [facility] hospital lab) *cepheid single result test* Anterior Nasal Swab     Status: None   Collection Time: 12/06/21  4:08 PM   Specimen: Anterior Nasal Swab  Result Value Ref Range Status   SARS Coronavirus 2 by RT PCR NEGATIVE NEGATIVE Final    Comment: (NOTE) SARS-CoV-2 target nucleic acids are NOT DETECTED.  The SARS-CoV-2 RNA is generally detectable in upper and lower respiratory specimens during the acute phase of infection. The lowest concentration of SARS-CoV-2 viral copies this assay can detect is 250 copies / mL. A negative result does not preclude SARS-CoV-2 infection and should not be used as the sole basis for treatment or other patient management decisions.  A negative result may occur with improper specimen collection / handling, submission of specimen other than nasopharyngeal swab, presence of viral mutation(s) within the areas targeted by this assay, and inadequate number of viral copies (<250 copies / mL). A negative result must be combined with clinical observations, patient history, and epidemiological information.  Fact Sheet for Patients:    https://www.patel.info/  Fact Sheet for Healthcare Providers: https://hall.com/  This test is not yet approved or  cleared by the [place] FDA and has been authorized for detection and/or diagnosis of SARS-CoV-2 by FDA under an Emergency Use Authorization (EUA).  This EUA will remain in effect (meaning this test can be used) for the duration of the COVID-19 declaration under Section 564(b)(1) of the Act, 21 U.S.C. section 360bbb-3(b)(1), unless the authorization is terminated or revoked sooner.  Performed at [facility], [address] [place], [place] [address]   Culture, blood (routine x 2)     Status: None (Preliminary result)   Collection Time: 12/06/21  4:08 PM   Specimen: BLOOD LEFT FOREARM  Result Value Ref Range Status   Specimen Description BLOOD LEFT FOREARM  Final   Special Requests   Final    BOTTLES DRAWN AEROBIC AND ANAEROBIC Blood Culture results may not be optimal due to an inadequate volume of blood received in culture bottles   Culture   Final  NO GROWTH < 24 HOURS Performed at [facility], [address] [place], [place] [address]    Report Status PENDING  Incomplete  Culture, blood (routine x 2)     Status: None (Preliminary result)   Collection Time: 12/06/21  4:08 PM   Specimen: BLOOD RIGHT FOREARM  Result Value Ref Range Status   Specimen Description BLOOD RIGHT FOREARM  Final   Special Requests   Final    BOTTLES DRAWN AEROBIC AND ANAEROBIC Blood Culture adequate volume   Culture   Final    NO GROWTH < 24 HOURS Performed at [facility], [address], [place], [place] [address]    Report Status PENDING  Incomplete         Radiology Studies: CT Angio Chest PE W and/or Wo Contrast  Result Date: 12/06/2021 CLINICAL DATA:  Chest pain, syncope, elevated D-dimer EXAM: CT ANGIOGRAPHY CHEST WITH CONTRAST TECHNIQUE: Multidetector CT imaging of the chest was performed  using the standard protocol during bolus administration of intravenous contrast. Multiplanar CT image reconstructions and MIPs were obtained to evaluate the vascular anatomy. RADIATION DOSE REDUCTION: This exam was performed according to the departmental dose-optimization program which includes automated exposure control, adjustment of the mA and/or kV according to patient size and/or use of iterative reconstruction technique. CONTRAST:  68m OMNIPAQUE IOHEXOL 350 MG/ML SOLN COMPARISON:  Chest radiograph dated 12/06/2021. CTA chest dated 12/02/2020. FINDINGS: Cardiovascular: Satisfactory opacification of the pulmonary arteries to the segmental level. No evidence of pulmonary embolism. Although not tailored for evaluation of the thoracic aorta, there is no evidence of thoracic aortic aneurysm or dissection. The heart is top-normal in size.  No pericardial effusion. Mediastinum/Nodes: No suspicious mediastinal lymphadenopathy. Visualized thyroid is unremarkable. Lungs/Pleura: Mild patchy left upper lobe and lingular opacities, suspicious for pneumonia. Mild mosaic attenuation in the lungs bilaterally. No suspicious pulmonary nodules. Trace left pleural effusion. No pneumothorax. Upper Abdomen: Visualized upper abdomen is notable for moderate hepatic steatosis and prior cholecystectomy. Musculoskeletal: Visualized osseous structures are within normal limits. Review of the MIP images confirms the above findings. IMPRESSION: No evidence of pulmonary embolism. Left upper lobe pneumonia.  Trace left pleural effusion. Electronically Signed   By: SJulian HyM.D.   On: 12/06/2021 19:40   DG Chest Portable 1 View  Result Date: 12/06/2021 CLINICAL DATA:  Chest pain and syncope beginning today. COVID positive. EXAM: PORTABLE CHEST 1 VIEW COMPARISON:  08/12/2021 FINDINGS: Lungs are hypoinflated without focal airspace consolidation or effusion. Cardiomediastinal silhouette and remainder of the exam is unchanged.  IMPRESSION: Hypoinflation without acute cardiopulmonary disease. Electronically Signed   By: DMarin OlpM.D.   On: 12/06/2021 16:20        Scheduled Meds:  DULoxetine  60 mg Oral Daily   enoxaparin (LOVENOX) injection  0.5 mg/kg Subcutaneous Q24H   QUEtiapine  100 mg Oral TID   sodium chloride flush  3 mL Intravenous Q12H   Continuous Infusions:  sodium chloride 100 mL/hr at 12/07/21 0135   [START ON 12/08/2021] azithromycin     cefTRIAXone (ROCEPHIN)  IV Stopped (12/07/21 0202)     LOS: 1 day    Time spent: 35 mins    JWyvonnia Dusky MD [facility] Pager [phone]  If 7PM-7AM, please contact night-coverage [email or web address] 12/07/2021, 8:35 AM

## 2021-12-07 NOTE — Assessment & Plan Note (Signed)
Continued duloxetine

## 2021-12-07 NOTE — ED Notes (Signed)
Pt given phone and menu to call dietary

## 2021-12-07 NOTE — Assessment & Plan Note (Signed)
Sinus tachyarrhythmia  History of cardiac arrest and stress cardiomyopathy 07/2021 (EF 50 to 55% 5/12, improved from 30 to 35% 5/7) Continuous cardiac monitoring to evaluate for arrhythmias IV metoprolol for rate control We will repeat echocardiogram to evaluate for cardiomyopathy UDS Neurochecks Consider cardiology consult if still tachycardic

## 2021-12-08 DIAGNOSIS — R55 Syncope and collapse: Secondary | ICD-10-CM | POA: Diagnosis not present

## 2021-12-08 DIAGNOSIS — J189 Pneumonia, unspecified organism: Secondary | ICD-10-CM | POA: Diagnosis not present

## 2021-12-08 LAB — CBC
HCT: 34.1 % — ABNORMAL LOW (ref 36.0–46.0)
Hemoglobin: 10.8 g/dL — ABNORMAL LOW (ref 12.0–15.0)
MCH: 30.2 pg (ref 26.0–34.0)
MCHC: 31.7 g/dL (ref 30.0–36.0)
MCV: 95.3 fL (ref 80.0–100.0)
Platelets: 211 10*3/uL (ref 150–400)
RBC: 3.58 MIL/uL — ABNORMAL LOW (ref 3.87–5.11)
RDW: 14.6 % (ref 11.5–15.5)
WBC: 4.9 10*3/uL (ref 4.0–10.5)
nRBC: 0 % (ref 0.0–0.2)

## 2021-12-08 LAB — HIV ANTIBODY (ROUTINE TESTING W REFLEX): HIV Screen 4th Generation wRfx: NONREACTIVE

## 2021-12-08 LAB — COMPREHENSIVE METABOLIC PANEL
ALT: 33 U/L (ref 0–44)
AST: 34 U/L (ref 15–41)
Albumin: 3 g/dL — ABNORMAL LOW (ref 3.5–5.0)
Alkaline Phosphatase: 97 U/L (ref 38–126)
Anion gap: 8 (ref 5–15)
BUN: <5 mg/dL — ABNORMAL LOW (ref 6–20)
CO2: 23 mmol/L (ref 22–32)
Calcium: 8.6 mg/dL — ABNORMAL LOW (ref 8.9–10.3)
Chloride: 113 mmol/L — ABNORMAL HIGH (ref 98–111)
Creatinine, Ser: 0.78 mg/dL (ref 0.44–1.00)
GFR, Estimated: >60 mL/min (ref >60)
Glucose, Bld: 86 mg/dL (ref 70–99)
Potassium: 3.5 mmol/L (ref 3.5–5.1)
Sodium: 144 mmol/L (ref 135–145)
Total Bilirubin: 0.7 mg/dL (ref 0.3–1.2)
Total Protein: 5.8 g/dL — ABNORMAL LOW (ref 6.5–8.1)

## 2021-12-08 LAB — ECHOCARDIOGRAM COMPLETE
AR max vel: 2.04 cm2
AV Peak grad: 6.9 mmHg
Ao pk vel: 1.31 m/s
Height: 63 in
S' Lateral: 3.19 cm
Weight: 4448 oz

## 2021-12-08 MED ORDER — MIRTAZAPINE 15 MG PO TBDP
15.0000 mg | ORAL_TABLET | Freq: Every day | ORAL | Status: DC
  Administered 2021-12-08: 15 mg via ORAL
  Filled 2021-12-08: qty 1

## 2021-12-08 MED ORDER — GABAPENTIN 400 MG PO CAPS
800.0000 mg | ORAL_CAPSULE | Freq: Three times a day (TID) | ORAL | Status: DC
  Administered 2021-12-08 (×2): 800 mg via ORAL
  Filled 2021-12-08 (×2): qty 2

## 2021-12-08 MED ORDER — AZITHROMYCIN 500 MG PO TABS: 500.0000 mg | ORAL_TABLET | Freq: Every day | ORAL | 0 refills | Status: AC

## 2021-12-08 MED ORDER — ORAL CARE MOUTH RINSE: 15.0000 mL | OROMUCOSAL | Status: DC | PRN

## 2021-12-08 MED ORDER — AZITHROMYCIN 250 MG PO TABS: 500.0000 mg | ORAL_TABLET | ORAL | Status: DC

## 2021-12-08 MED ORDER — GABAPENTIN 800 MG PO TABS
800.0000 mg | ORAL_TABLET | Freq: Three times a day (TID) | ORAL | Status: DC
  Filled 2021-12-08: qty 1

## 2021-12-08 MED ORDER — TIZANIDINE HCL 2 MG PO TABS
2.0000 mg | ORAL_TABLET | Freq: Two times a day (BID) | ORAL | Status: DC
  Administered 2021-12-08 (×2): 2 mg via ORAL
  Filled 2021-12-08 (×2): qty 1

## 2021-12-08 NOTE — Progress Notes (Signed)
PHARMACIST - PHYSICIAN COMMUNICATION DR:   Mayford Knife CONCERNING: Antibiotic IV to Oral Route Change Policy  RECOMMENDATION: This patient is receiving Azithromycin by the intravenous route.  Based on criteria approved by the Pharmacy and Therapeutics Committee, the antibiotic(s) is/are being converted to the equivalent oral dose form(s).   DESCRIPTION: These criteria include: Patient being treated for a respiratory tract infection, urinary tract infection, cellulitis or clostridium difficile associated diarrhea if on metronidazole The patient is not neutropenic and does not exhibit a GI malabsorption state The patient is eating (either orally or via tube) and/or has been taking other orally administered medications for a least 24 hours The patient is improving clinically and has a Tmax < 100.5  If you have questions about this conversion, please contact the Pharmacy Department   Sherard Sutch Rodriguez-Guzman PharmD, BCPS 12/08/2021 2:47 PM

## 2021-12-08 NOTE — Progress Notes (Signed)
Patient's scheduled medications on her home  med list have been updated for this hospital admission. She has resumed taking the medications below in the doses she takes them at home.  Duloxetine Gabapentin Cozaar 50mg  Tizanidine Mirtazipine  Provider on call has restarted all her home medications except for Cozaar which he has deferred for the Physician on day shift to evaluate and approve.  Morphine, Klonopin and Seroquel administered this shift. Tizanidine and Mirtazapine administered this morning.

## 2021-12-08 NOTE — Progress Notes (Signed)
PROGRESS NOTE    Audrey [name]  MRN:[id number] DOB: 11/30/1975 DOA: 12/06/2021 PCP: Payne, Morgan H, PA-C    Assessment & Plan:   Principal Problem:   Syncopal episodes, suspect vasovagal Active Problems:   Pneumonia   History of cardiac arrest   Acute gastroenteritis   SIRS (systemic inflammatory response syndrome) (HCC)   Tachyarrhythmia   Elevated LFTs   Chronic pain syndrome   Pseudotumor cerebri   Essential hypertension   Schizoaffective disorder, depressive type (HCC)  Assessment and Plan: Syncopal episodes: etiology unclear, possible vasovagal. Hx of out of hospital cardiac arrest & found to have stress cardiomyopathy 5/23 ( EF 50-55% on 08/08/21 & improved from EF 30-35% on 08/03/21). Continue on tele. Orthostatics are neg. Echo is pending. Continue on metoprolol   Acute gastroenteritis: GI PCR panel is ordered but not collected yet. Zofran prn   Sepsis: met criteria w/ tachycardia, tachypnea & pneumonia. Continue on IV abxs. Blood cxs NGTD. Viral respiratory panel is neg. Sepsis resolved    Pneumonia: left upper lobe as per CTA chest. Continue on IV rocephin, azithromycin, bronchodilators & encourage incentive spirometry. Procal <0.10  Transamnititis: resolved    Chronic pain syndrome: has polypharmacy w/ multiple sedating meds. Sedating meds were restarted overnight    Schizoaffective disorder: depressive type. Continue on home dose of duloxetine    HTN: holding losartan as BP is low normal   Anxiety: severity unknown. Continue on home dose of klonopin    Morbid obesity: BMI 48.9. Complicates overall care & prognosis     DVT prophylaxis: lovenox  Code Status: full  Family Communication:  Disposition Plan: likely d//c back home   Level of care: Progressive  Status is: Inpatient Remains inpatient appropriate because: severity of illness, likely d/c back home tomorrow     Consultants:    Procedures:  Antimicrobials: rocephin,  azithromycin   Subjective: Pt c/o pain   Objective: Vitals:   12/08/21 0044 12/08/21 0400 12/08/21 0500 12/08/21 0734  BP: 130/79 132/86  95/71  Pulse: 90 90  92  Resp: 17 20  20  Temp: 98.5 F (36.9 C) 98.1 F (36.7 C)  98.2 F (36.8 C)  TempSrc: Oral Oral    SpO2:    97%  Weight:   125.4 kg   Height:        Intake/Output Summary (Last 24 hours) at 12/08/2021 0802 Last data filed at 12/08/2021 0343 Gross per 24 hour  Intake 1000 ml  Output --  Net 1000 ml   Filed Weights   12/07/21 0119 12/08/21 0500  Weight: 126.1 kg 125.4 kg    Examination:  General exam: Appears comfortable  Respiratory system: diminished breath sounds b/l  Cardiovascular system: S1/S2+. No rubs or gallops  Gastrointestinal system: Abd is soft, NT, obese & hypoactive bowel sounds. Central nervous system: Alert and oriented. Moves all extremities Psychiatry: judgement and insight appears normal. Flat mood and affect   Data Reviewed: I have personally reviewed following labs and imaging studies  CBC: Recent Labs  Lab 12/06/21 1608 12/07/21 0516 12/08/21 0428  WBC 6.7 6.0 4.9  NEUTROABS 4.1  --   --   HGB 12.6 11.2* 10.8*  HCT 40.2 35.6* 34.1*  MCV 94.8 96.0 95.3  PLT 221 218 211   Basic Metabolic Panel: Recent Labs  Lab 12/06/21 1640 12/07/21 0516 12/08/21 0428  NA 143  --  144  K 4.5  --  3.5  CL 110  --  113*  CO2 27  --    23  GLUCOSE 102*  --  86  BUN 6  --  <5*  CREATININE 0.83 0.78 0.78  CALCIUM 9.1  --  8.6*   GFR: Estimated Creatinine Clearance: 112 mL/min (by C-G formula based on SCr of 0.78 mg/dL). Liver Function Tests: Recent Labs  Lab 12/06/21 1640 12/08/21 0428  AST 54* 34  ALT 45* 33  ALKPHOS 120 97  BILITOT 1.3* 0.7  PROT 7.1 5.8*  ALBUMIN 3.5 3.0*   No results for input(s): "LIPASE", "AMYLASE" in the last 168 hours. No results for input(s): "AMMONIA" in the last 168 hours. Coagulation Profile: No results for input(s): "INR", "PROTIME" in the last  168 hours. Cardiac Enzymes: No results for input(s): "CKTOTAL", "CKMB", "CKMBINDEX", "TROPONINI" in the last 168 hours. BNP (last 3 results) No results for input(s): "PROBNP" in the last 8760 hours. HbA1C: No results for input(s): "HGBA1C" in the last 72 hours. CBG: No results for input(s): "GLUCAP" in the last 168 hours. Lipid Profile: No results for input(s): "CHOL", "HDL", "LDLCALC", "TRIG", "CHOLHDL", "LDLDIRECT" in the last 72 hours. Thyroid Function Tests: No results for input(s): "TSH", "T4TOTAL", "FREET4", "T3FREE", "THYROIDAB" in the last 72 hours. Anemia Panel: No results for input(s): "VITAMINB12", "FOLATE", "FERRITIN", "TIBC", "IRON", "RETICCTPCT" in the last 72 hours. Sepsis Labs: Recent Labs  Lab 12/06/21 1608 12/06/21 1736  PROCALCITON  --  <0.10  LATICACIDVEN 1.4  --     Recent Results (from the past 240 hour(s))  SARS Coronavirus 2 by RT PCR (hospital order, performed in [place] hospital lab) *cepheid single result test* Anterior Nasal Swab     Status: None   Collection Time: 12/06/21  4:08 PM   Specimen: Anterior Nasal Swab  Result Value Ref Range Status   SARS Coronavirus 2 by RT PCR NEGATIVE NEGATIVE Final    Comment: (NOTE) SARS-CoV-2 target nucleic acids are NOT DETECTED.  The SARS-CoV-2 RNA is generally detectable in upper and lower respiratory specimens during the acute phase of infection. The lowest concentration of SARS-CoV-2 viral copies this assay can detect is 250 copies / mL. A negative result does not preclude SARS-CoV-2 infection and should not be used as the sole basis for treatment or other patient management decisions.  A negative result may occur with improper specimen collection / handling, submission of specimen other than nasopharyngeal swab, presence of viral mutation(s) within the areas targeted by this assay, and inadequate number of viral copies (<250 copies / mL). A negative result must be combined with clinical observations,  patient history, and epidemiological information.  Fact Sheet for Patients:   https://www.fda.gov/media/158405/download  Fact Sheet for Healthcare Providers: https://www.fda.gov/media/158404/download  This test is not yet approved or  cleared by the [place] FDA and has been authorized for detection and/or diagnosis of SARS-CoV-2 by FDA under an Emergency Use Authorization (EUA).  This EUA will remain in effect (meaning this test can be used) for the duration of the COVID-19 declaration under Section 564(b)(1) of the Act, 21 U.S.C. section 360bbb-3(b)(1), unless the authorization is terminated or revoked sooner.  Performed at [facility], [address] [place], [place] [address]   Culture, blood (routine x 2)     Status: None (Preliminary result)   Collection Time: 12/06/21  4:08 PM   Specimen: BLOOD LEFT FOREARM  Result Value Ref Range Status   Specimen Description BLOOD LEFT FOREARM  Final   Special Requests   Final    BOTTLES DRAWN AEROBIC AND ANAEROBIC Blood Culture results may not be optimal   due to an inadequate volume of blood received in culture bottles   Culture   Final    NO GROWTH 2 DAYS Performed at [facility], [address] [place], [place] [address]    Report Status PENDING  Incomplete  Culture, blood (routine x 2)     Status: None (Preliminary result)   Collection Time: 12/06/21  4:08 PM   Specimen: BLOOD RIGHT FOREARM  Result Value Ref Range Status   Specimen Description BLOOD RIGHT FOREARM  Final   Special Requests   Final    BOTTLES DRAWN AEROBIC AND ANAEROBIC Blood Culture adequate volume   Culture   Final    NO GROWTH 2 DAYS Performed at [facility], [address] [place], [place] [address]    Report Status PENDING  Incomplete  Respiratory (~20 pathogens) panel by PCR     Status: None   Collection Time: 12/07/21  5:16 AM   Specimen: Nasopharyngeal Swab; Respiratory  Result Value Ref Range Status    Adenovirus NOT DETECTED NOT DETECTED Final   Coronavirus 229E NOT DETECTED NOT DETECTED Final    Comment: (NOTE) The Coronavirus on the Respiratory Panel, DOES NOT test for the novel  Coronavirus (2019 nCoV)    Coronavirus HKU1 NOT DETECTED NOT DETECTED Final   Coronavirus NL63 NOT DETECTED NOT DETECTED Final   Coronavirus OC43 NOT DETECTED NOT DETECTED Final   Metapneumovirus NOT DETECTED NOT DETECTED Final   Rhinovirus / Enterovirus NOT DETECTED NOT DETECTED Final   Influenza A NOT DETECTED NOT DETECTED Final   Influenza B NOT DETECTED NOT DETECTED Final   Parainfluenza Virus 1 NOT DETECTED NOT DETECTED Final   Parainfluenza Virus 2 NOT DETECTED NOT DETECTED Final   Parainfluenza Virus 3 NOT DETECTED NOT DETECTED Final   Parainfluenza Virus 4 NOT DETECTED NOT DETECTED Final   Respiratory Syncytial Virus NOT DETECTED NOT DETECTED Final   Bordetella pertussis NOT DETECTED NOT DETECTED Final   Bordetella Parapertussis NOT DETECTED NOT DETECTED Final   Chlamydophila pneumoniae NOT DETECTED NOT DETECTED Final   Mycoplasma pneumoniae NOT DETECTED NOT DETECTED Final    Comment: Performed at  Hospital Lab, [address] [place], [place] [address]         Radiology Studies: CT Angio Chest PE W and/or Wo Contrast  Result Date: 12/06/2021 CLINICAL DATA:  Chest pain, syncope, elevated D-dimer EXAM: CT ANGIOGRAPHY CHEST WITH CONTRAST TECHNIQUE: Multidetector CT imaging of the chest was performed using the standard protocol during bolus administration of intravenous contrast. Multiplanar CT image reconstructions and MIPs were obtained to evaluate the vascular anatomy. RADIATION DOSE REDUCTION: This exam was performed according to the departmental dose-optimization program which includes automated exposure control, adjustment of the mA and/or kV according to patient size and/or use of iterative reconstruction technique. CONTRAST:  80mL OMNIPAQUE IOHEXOL 350 MG/ML SOLN COMPARISON:  Chest  radiograph dated 12/06/2021. CTA chest dated 12/02/2020. FINDINGS: Cardiovascular: Satisfactory opacification of the pulmonary arteries to the segmental level. No evidence of pulmonary embolism. Although not tailored for evaluation of the thoracic aorta, there is no evidence of thoracic aortic aneurysm or dissection. The heart is top-normal in size.  No pericardial effusion. Mediastinum/Nodes: No suspicious mediastinal lymphadenopathy. Visualized thyroid is unremarkable. Lungs/Pleura: Mild patchy left upper lobe and lingular opacities, suspicious for pneumonia. Mild mosaic attenuation in the lungs bilaterally. No suspicious pulmonary nodules. Trace left pleural effusion. No pneumothorax. Upper Abdomen: Visualized upper abdomen is notable for moderate hepatic steatosis and prior cholecystectomy. Musculoskeletal: Visualized osseous   structures are within normal limits. Review of the MIP images confirms the above findings. IMPRESSION: No evidence of pulmonary embolism. Left upper lobe pneumonia.  Trace left pleural effusion. Electronically Signed   By: Sriyesh  Krishnan M.D.   On: 12/06/2021 19:40   DG Chest Portable 1 View  Result Date: 12/06/2021 CLINICAL DATA:  Chest pain and syncope beginning today. COVID positive. EXAM: PORTABLE CHEST 1 VIEW COMPARISON:  08/12/2021 FINDINGS: Lungs are hypoinflated without focal airspace consolidation or effusion. Cardiomediastinal silhouette and remainder of the exam is unchanged. IMPRESSION: Hypoinflation without acute cardiopulmonary disease. Electronically Signed   By: Daniel  Boyle M.D.   On: 12/06/2021 16:20        Scheduled Meds:  clonazePAM  1 mg Oral BID   DULoxetine  60 mg Oral Daily   enoxaparin (LOVENOX) injection  0.5 mg/kg Subcutaneous Q24H   gabapentin  800 mg Oral TID   metoprolol succinate  12.5 mg Oral Daily   mirtazapine  15 mg Oral QHS   QUEtiapine  100 mg Oral TID   sodium chloride flush  3 mL Intravenous Q12H   tiZANidine  2 mg Oral BID    Continuous Infusions:  azithromycin Stopped (12/08/21 0057)   cefTRIAXone (ROCEPHIN)  IV 2 g (12/08/21 0621)     LOS: 2 days    Time spent: 30 mins     M , MD [facility] Pager [phone]  If 7PM-7AM, please contact night-coverage [email or web address] 12/08/2021, 8:02 AM  

## 2021-12-08 NOTE — Progress Notes (Signed)
Patient has concerns about her medication list and would like to discuss with the doctor regarding her regularly scheduled medications. Will inform physician and oncoming nurse.

## 2021-12-08 NOTE — Progress Notes (Signed)
Patient's entire respiratory panel is negative. COVID negative. Respiratory panel completed 09/10. Droplet precautions taken prior to floor admissions have been discontinued

## 2021-12-08 NOTE — Progress Notes (Signed)
Mobility Specialist - Progress Note   12/08/21 0958  Mobility  Activity Ambulated independently in room;Stood at bedside;Dangled on edge of bed  Level of Assistance Independent  Distance Ambulated (ft) 30 ft  Activity Response Tolerated well  $Mobility charge 1 Mobility   Pt supine in bed on RA upon arrival. Pt STS and ambulates within room indep. Pt returns to bed with needs in reach and bed alarm on.   Terrilyn Saver  Mobility Specialist  12/08/21 9:59 AM

## 2021-12-08 NOTE — Discharge Summary (Signed)
Physician Discharge Summary  Audrey [name] SWN:[id number] DOB: 1975-07-23 DOA: 12/06/2021  PCP: Sue Lush, PA-C  Admit date: 12/06/2021 Discharge date: 12/08/2021  Admitted From: home  Disposition:  home   Recommendations for Outpatient Follow-up:  Follow up with PCP in 1-2 weeks F/u w/ cardio in 1-2 weeks  Home Health: no  Equipment/Devices:  Discharge Condition: stable  CODE STATUS: full  Diet recommendation: Heart Healthy  Brief/Interim Summary: HPI was taken from Dr. Damita Dunnings: Cephus Shelling Cobos is a 46 y.o. female with medical history significant for Pseudotumor cerebri, anxiety, depression, PTSD,, schizoaffective disorder, and chronic opioid use, hospitalized from 5/6 to 5/17 with stress cardiomyopathy secondary to out of hospital cardiac arrest from intentional opioid overdose with improvement in EF from 30 to 35% on 5/7 to 50 to 55% by discharge on 5/12, who presents to the ED by EMS with complaints of chest pain, shortness of breath and 5 syncopal episodes. Over the past four days she has had a cough and fever as well as non bloody and non bilious vomiting as well as non bloody diarrhea and colicky abdominal pain with the diarrhea.She also stated she tested positive for COVID 5 days prior.   ED course and data review: Pulse 156 and respirations 25 on arrival with otherwise normal vitals.  Troponin 2, D-dimer 0.57, BNP not done.  WBC and rest of CBC normal.  VBG with pH 7.44 but otherwise unremarkable.  CMP with slight elevation in transaminases with AST 54 and ALT 45 and total bilirubin 1.3.  Procalcitonin pending, urinalysis unremarkable.  EKG showing sinus tachycardia at 150 with frequent PVCs and nonspecific ST-T wave changes CTA chest shows no evidence of PE but shows left upper lobe pneumonia and trace left pleural effusion. Patient started on Levaquin, given a 2 L fluid bolus, given hydromorphone for pain and hospitalist consulted for admission. Heart rate did improve  to 96 just prior to admission.  As per Dr. Jimmye Norman 9/10-9/11/23: Pt presented after syncopal episodes. The etiology of the syncopal episodes were not clear but they were possibly secondary to vasovagal episodes. Repeat echo showed EF 55-60%, no regional wall motion abnormalities, diastolic function was indeterminate & no atrial shunt was detected. Pt was found to have sepsis secondary to pneumonia and was treated w/ IV rocephin, azithromycin, bronchodilators & incentive spirometry. Pt was d/c home on po azithromycin to complete the course.   Discharge Diagnoses:  Principal Problem:   Syncopal episodes, suspect vasovagal Active Problems:   Pneumonia   History of cardiac arrest   Acute gastroenteritis   SIRS (systemic inflammatory response syndrome) (HCC)   Tachyarrhythmia   Elevated LFTs   Chronic pain syndrome   Pseudotumor cerebri   Essential hypertension   Schizoaffective disorder, depressive type ([place])  Syncopal episodes: etiology unclear, possible vasovagal. Hx of out of hospital cardiac arrest & found to have stress cardiomyopathy 5/23 ( EF 50-55% on 08/08/21 & improved from EF 30-35% on 08/03/21). Continue on tele. Orthostatics are neg. Echo shows EF 55-60%, no regional wall motion abnormalities, diastolic function was indeterminate & no atrial shunt was detected.  . Continue on metoprolol    Acute gastroenteritis: resolved.     Sepsis: met criteria w/ tachycardia, tachypnea & pneumonia. Continue on IV abxs. Blood cxs NGTD. Viral respiratory panel is neg. Sepsis resolved    Pneumonia: left upper lobe as per CTA chest. Continue on IV rocephin, azithromycin, bronchodilators & encourage incentive spirometry. Procal <0.10. D/c home w/ po azithromycin to complete the course  Transamnititis: resolved    Chronic pain syndrome: has polypharmacy w/ multiple sedating meds. Sedating meds were restarted overnight    Schizoaffective disorder: depressive type. Continue on home dose of  duloxetine    HTN: holding losartan as BP is low normal    Anxiety: severity unknown. Continue on home dose of klonopin    Morbid obesity: BMI 48.9. Complicates overall care & prognosis   Discharge Instructions  Discharge Instructions     Diet - low sodium heart healthy   Complete by: As directed    Discharge instructions   Complete by: As directed    F/u w/ PCP in 1-2 weeks. F/u w/ cardiologist in 1-2 weeks   Increase activity slowly   Complete by: As directed       Allergies as of 12/08/2021       Reactions   Penicillins Anaphylaxis, Rash   Tolerates ceftriaxone   Prochlorperazine Anaphylaxis, Nausea And Vomiting   Propoxyphene Nausea And Vomiting, Other (See Comments)   Acetazolamide Other (See Comments)   Joint pain   Latex Hives, Swelling   Nitroglycerin Er Swelling, Other (See Comments)   Body swells, but no breathing issues        Medication List     STOP taking these medications    cyanocobalamin 1000 MCG tablet   ketorolac 10 MG tablet Commonly known as: TORADOL   methocarbamol 500 MG tablet Commonly known as: ROBAXIN   multivitamin with minerals Tabs tablet   mupirocin ointment 2 % Commonly known as: BACTROBAN   pantoprazole 40 MG tablet Commonly known as: PROTONIX   potassium chloride SA 20 MEQ tablet Commonly known as: KLOR-CON M   predniSONE 50 MG tablet Commonly known as: DELTASONE   Vitamin D (Ergocalciferol) 1.25 MG (50000 UNIT) Caps capsule Commonly known as: DRISDOL       TAKE these medications    albuterol 108 (90 Base) MCG/ACT inhaler Commonly known as: VENTOLIN HFA Inhale 2 puffs into the lungs every 6 (six) hours as needed for wheezing or shortness of breath.   azithromycin 500 MG tablet Commonly known as: ZITHROMAX Take 1 tablet (500 mg total) by mouth daily for 3 days.   clonazePAM 1 MG tablet Commonly known as: KLONOPIN Take 1 mg by mouth 2 (two) times daily.   docusate sodium 100 MG capsule Commonly known  as: COLACE Take 1 capsule (100 mg total) by mouth 2 (two) times daily as needed for mild constipation.   DULoxetine 60 MG capsule Commonly known as: CYMBALTA Take 1 capsule (60 mg total) by mouth daily.   gabapentin 800 MG tablet Commonly known as: NEURONTIN Take 800 mg by mouth 3 (three) times daily.   HYDROmorphone 2 MG tablet Commonly known as: DILAUDID Take 2 mg by mouth 2 (two) times daily as needed for severe pain.   losartan 50 MG tablet Commonly known as: COZAAR Take 50 mg by mouth daily. What changed: Another medication with the same name was removed. Continue taking this medication, and follow the directions you see here.   metoprolol succinate 25 MG 24 hr tablet Commonly known as: TOPROL-XL Take 0.5 tablets (12.5 mg total) by mouth daily.   mirtazapine 15 MG disintegrating tablet Commonly known as: REMERON SOL-TAB Take 15 mg by mouth at bedtime.   naloxone 4 MG/0.1ML Liqd nasal spray kit Commonly known as: NARCAN Place 1 spray into the nose once as needed (for accidental overdose).   QUEtiapine 100 MG tablet Commonly known as: SEROQUEL Take 100 mg by  mouth 3 (three) times daily.   tiZANidine 2 MG tablet Commonly known as: ZANAFLEX Take 1 tablet (2 mg total) by mouth 2 (two) times daily.        Allergies  Allergen Reactions   Penicillins Anaphylaxis and Rash    Tolerates ceftriaxone   Prochlorperazine Anaphylaxis and Nausea And Vomiting   Propoxyphene Nausea And Vomiting and Other (See Comments)   Acetazolamide Other (See Comments)    Joint pain   Latex Hives and Swelling   Nitroglycerin Er Swelling and Other (See Comments)    Body swells, but no breathing issues    Consultations:    Procedures/Studies: ECHOCARDIOGRAM COMPLETE  Result Date: 12/08/2021    ECHOCARDIOGRAM REPORT   Patient Name:   EMMAROSE KLINKE Date of Exam: 12/07/2021 Medical Rec #:  [id number]            Height:       63.0 in Accession #:    [id number]           Weight:        278.0 lb Date of Birth:  1976-02-10            BSA:          2.224 m Patient Age:    46 years             BP:           124/76 mmHg Patient Gender: F                    HR:           86 bpm. Exam Location:  [facility] Procedure: 2D Echo and Intracardiac Opacification Agent Indications:     Syncope  History:         Patient has prior history of Echocardiogram examinations.                  Signs/Symptoms:Murmur.  Sonographer:     L. Thornton-Maynard Referring Phys:  [id number] Athena Masse Diagnosing Phys: Kathlyn Sacramento MD  Sonographer Comments: Technically difficult study due to poor echo windows. Image acquisition challenging due to patient body habitus. IMPRESSIONS  1. Left ventricular ejection fraction, by estimation, is 55 to 60%. The left ventricle has normal function. The left ventricle has no regional wall motion abnormalities. There is mild left ventricular hypertrophy. Left ventricular diastolic parameters are indeterminate.  2. Right ventricular systolic function is normal. The right ventricular size is normal. Tricuspid regurgitation signal is inadequate for assessing PA pressure.  3. The mitral valve is normal in structure. No evidence of mitral valve regurgitation. No evidence of mitral stenosis.  4. The aortic valve is normal in structure. Aortic valve regurgitation is not visualized. No aortic stenosis is present.  5. Technically difficult study due to poor echo windows FINDINGS  Left Ventricle: Left ventricular ejection fraction, by estimation, is 55 to 60%. The left ventricle has normal function. The left ventricle has no regional wall motion abnormalities. Definity contrast agent was given IV to delineate the left ventricular  endocardial borders. The left ventricular internal cavity size was normal in size. There is mild left ventricular hypertrophy. Left ventricular diastolic parameters are indeterminate. Right Ventricle: The right ventricular size is normal. No increase in right ventricular wall  thickness. Right ventricular systolic function is normal. Tricuspid regurgitation signal is inadequate for assessing PA pressure. Left Atrium: Left atrial size was normal in size. Right Atrium: Right atrial size was normal in size. Pericardium: There  is no evidence of pericardial effusion. Mitral Valve: The mitral valve is normal in structure. No evidence of mitral valve regurgitation. No evidence of mitral valve stenosis. Tricuspid Valve: The tricuspid valve is normal in structure. Tricuspid valve regurgitation is not demonstrated. No evidence of tricuspid stenosis. Aortic Valve: The aortic valve is normal in structure. Aortic valve regurgitation is not visualized. No aortic stenosis is present. Aortic valve peak gradient measures 6.9 mmHg. Pulmonic Valve: The pulmonic valve was normal in structure. Pulmonic valve regurgitation is not visualized. No evidence of pulmonic stenosis. Aorta: The aortic root is normal in size and structure. Venous: The inferior vena cava was not well visualized. IAS/Shunts: No atrial level shunt detected by color flow Doppler.  LEFT VENTRICLE PLAX 2D LVIDd:         4.78 cm   Diastology LVIDs:         3.19 cm   LV e' medial:   7.51 cm/s LV PW:         1.09 cm   LV E/e' medial: 7.2 LV IVS:        1.17 cm LVOT diam:     2.00 cm LV SV:         52 LV SV Index:   23 LVOT Area:     3.14 cm  RIGHT VENTRICLE RV S prime:     21.10 cm/s TAPSE (M-mode): 3.0 cm LEFT ATRIUM             Index LA diam:        3.40 cm 1.53 cm/m LA Vol (A2C):   61.5 ml 27.65 ml/m LA Vol (A4C):   56.8 ml 25.54 ml/m LA Biplane Vol: 60.5 ml 27.20 ml/m  AORTIC VALVE                 PULMONIC VALVE AV Area (Vmax): 2.04 cm     PV Vmax:       0.91 m/s AV Vmax:        131.00 cm/s  PV Peak grad:  3.3 mmHg AV Peak Grad:   6.9 mmHg LVOT Vmax:      84.90 cm/s LVOT Vmean:     59.000 cm/s LVOT VTI:       0.164 m  AORTA Ao Root diam: 3.00 cm MV E velocity: 54.00 cm/s MV A velocity: 96.80 cm/s  SHUNTS MV E/A ratio:  0.56         Systemic VTI:  0.16 m                            Systemic Diam: 2.00 cm Kathlyn Sacramento MD Electronically signed by Kathlyn Sacramento MD Signature Date/Time: 12/08/2021/10:50:57 AM    Final    CT Angio Chest PE W and/or Wo Contrast  Result Date: 12/06/2021 CLINICAL DATA:  Chest pain, syncope, elevated D-dimer EXAM: CT ANGIOGRAPHY CHEST WITH CONTRAST TECHNIQUE: Multidetector CT imaging of the chest was performed using the standard protocol during bolus administration of intravenous contrast. Multiplanar CT image reconstructions and MIPs were obtained to evaluate the vascular anatomy. RADIATION DOSE REDUCTION: This exam was performed according to the departmental dose-optimization program which includes automated exposure control, adjustment of the mA and/or kV according to patient size and/or use of iterative reconstruction technique. CONTRAST:  65m OMNIPAQUE IOHEXOL 350 MG/ML SOLN COMPARISON:  Chest radiograph dated 12/06/2021. CTA chest dated 12/02/2020. FINDINGS: Cardiovascular: Satisfactory opacification of the pulmonary arteries to the segmental level. No evidence of pulmonary embolism.  Although not tailored for evaluation of the thoracic aorta, there is no evidence of thoracic aortic aneurysm or dissection. The heart is top-normal in size.  No pericardial effusion. Mediastinum/Nodes: No suspicious mediastinal lymphadenopathy. Visualized thyroid is unremarkable. Lungs/Pleura: Mild patchy left upper lobe and lingular opacities, suspicious for pneumonia. Mild mosaic attenuation in the lungs bilaterally. No suspicious pulmonary nodules. Trace left pleural effusion. No pneumothorax. Upper Abdomen: Visualized upper abdomen is notable for moderate hepatic steatosis and prior cholecystectomy. Musculoskeletal: Visualized osseous structures are within normal limits. Review of the MIP images confirms the above findings. IMPRESSION: No evidence of pulmonary embolism. Left upper lobe pneumonia.  Trace left pleural effusion.  Electronically Signed   By: Julian Hy M.D.   On: 12/06/2021 19:40   DG Chest Portable 1 View  Result Date: 12/06/2021 CLINICAL DATA:  Chest pain and syncope beginning today. COVID positive. EXAM: PORTABLE CHEST 1 VIEW COMPARISON:  08/12/2021 FINDINGS: Lungs are hypoinflated without focal airspace consolidation or effusion. Cardiomediastinal silhouette and remainder of the exam is unchanged. IMPRESSION: Hypoinflation without acute cardiopulmonary disease. Electronically Signed   By: Marin Olp M.D.   On: 12/06/2021 16:20   DG Elbow Complete Left  Result Date: 11/17/2021 CLINICAL DATA:  Fall, left elbow pain EXAM: LEFT ELBOW - COMPLETE 3+ VIEW COMPARISON:  None Available. FINDINGS: There is no evidence of fracture, dislocation, or joint effusion. There is no evidence of arthropathy or other focal bone abnormality. Soft tissues are unremarkable. IMPRESSION: Negative. Electronically Signed   By: Fidela Salisbury M.D.   On: 11/17/2021 19:40   DG Lumbar Spine 2-3 Views  Result Date: 11/17/2021 CLINICAL DATA:  Fall, left hip pain EXAM: LUMBAR SPINE - 2-3 VIEW COMPARISON:  None Available. FINDINGS: Lateral imaging is limited by underpenetration. There is no evidence of lumbar spine fracture. Alignment is normal. Intervertebral disc spaces are maintained. IMPRESSION: No acute fracture or subluxation. Electronically Signed   By: Fidela Salisbury M.D.   On: 11/17/2021 19:37   DG Hip Unilat W or Wo Pelvis 2-3 Views Left  Result Date: 11/17/2021 CLINICAL DATA:  Fall, left hip and elbow pain EXAM: DG HIP (WITH OR WITHOUT PELVIS) 2-3V LEFT COMPARISON:  None Available. FINDINGS: There is no evidence of hip fracture or dislocation. There is no evidence of arthropathy or other focal bone abnormality. IMPRESSION: Negative. Electronically Signed   By: Fidela Salisbury M.D.   On: 11/17/2021 19:36   (Echo, Carotid, EGD, Colonoscopy, ERCP)    Subjective: Pt c/o pain    Discharge Exam: Vitals:   12/08/21 0400  12/08/21 0734  BP: 132/86 95/71  Pulse: 90 92  Resp: 20 20  Temp: 98.1 F (36.7 C) 98.2 F (36.8 C)  SpO2:  97%   Vitals:   12/08/21 0044 12/08/21 0400 12/08/21 0500 12/08/21 0734  BP: 130/79 132/86  95/71  Pulse: 90 90  92  Resp: _0 Temp: 98.5 F (36.9 C) 98.1 F (36.7 C)  98.2 F (36.8 C)  TempSrc: Oral Oral    SpO2:    97%  Weight:   125.4 kg   Height:        General: Pt is alert, awake, not in acute distress Cardiovascular:  S1/S2 +, no rubs, no gallops Respiratory: diminished breath sounds b/l  Abdominal: Soft, NT, obese, bowel sounds + Extremities: no cyanosis    The results of significant diagnostics from this hospitalization (including imaging, microbiology, ancillary and laboratory) are listed below for reference.     Microbiology:  Recent Results (from the past 240 hour(s))  SARS Coronavirus 2 by RT PCR (hospital order, performed in [facility] hospital lab) *cepheid single result test* Anterior Nasal Swab     Status: None   Collection Time: 12/06/21  4:08 PM   Specimen: Anterior Nasal Swab  Result Value Ref Range Status   SARS Coronavirus 2 by RT PCR NEGATIVE NEGATIVE Final    Comment: (NOTE) SARS-CoV-2 target nucleic acids are NOT DETECTED.  The SARS-CoV-2 RNA is generally detectable in upper and lower respiratory specimens during the acute phase of infection. The lowest concentration of SARS-CoV-2 viral copies this assay can detect is 250 copies / mL. A negative result does not preclude SARS-CoV-2 infection and should not be used as the sole basis for treatment or other patient management decisions.  A negative result may occur with improper specimen collection / handling, submission of specimen other than nasopharyngeal swab, presence of viral mutation(s) within the areas targeted by this assay, and inadequate number of viral copies (<250 copies / mL). A negative result must be combined with clinical observations, patient history, and  epidemiological information.  Fact Sheet for Patients:   https://www.patel.info/  Fact Sheet for Healthcare Providers: https://hall.com/  This test is not yet approved or  cleared by the [place] FDA and has been authorized for detection and/or diagnosis of SARS-CoV-2 by FDA under an Emergency Use Authorization (EUA).  This EUA will remain in effect (meaning this test can be used) for the duration of the COVID-19 declaration under Section 564(b)(1) of the Act, 21 U.S.C. section 360bbb-3(b)(1), unless the authorization is terminated or revoked sooner.  Performed at [facility], [address] [place], [place] [address]   Culture, blood (routine x 2)     Status: None (Preliminary result)   Collection Time: 12/06/21  4:08 PM   Specimen: BLOOD LEFT FOREARM  Result Value Ref Range Status   Specimen Description BLOOD LEFT FOREARM  Final   Special Requests   Final    BOTTLES DRAWN AEROBIC AND ANAEROBIC Blood Culture results may not be optimal due to an inadequate volume of blood received in culture bottles   Culture   Final    NO GROWTH 2 DAYS Performed at [facility], [address], [place], [place] [address]    Report Status PENDING  Incomplete  Culture, blood (routine x 2)     Status: None (Preliminary result)   Collection Time: 12/06/21  4:08 PM   Specimen: BLOOD RIGHT FOREARM  Result Value Ref Range Status   Specimen Description BLOOD RIGHT FOREARM  Final   Special Requests   Final    BOTTLES DRAWN AEROBIC AND ANAEROBIC Blood Culture adequate volume   Culture   Final    NO GROWTH 2 DAYS Performed at [facility], [address] [place], [place] [address]    Report Status PENDING  Incomplete  Respiratory (~20 pathogens) panel by PCR     Status: None   Collection Time: 12/07/21  5:16 AM   Specimen: Nasopharyngeal Swab; Respiratory  Result Value Ref Range Status   Adenovirus NOT DETECTED NOT  DETECTED Final   Coronavirus 229E NOT DETECTED NOT DETECTED Final    Comment: (NOTE) The Coronavirus on the Respiratory Panel, DOES NOT test for the novel  Coronavirus (2019 nCoV)    Coronavirus HKU1 NOT DETECTED NOT DETECTED Final   Coronavirus NL63 NOT DETECTED NOT DETECTED Final   Coronavirus OC43 NOT DETECTED NOT DETECTED Final   Metapneumovirus NOT  DETECTED NOT DETECTED Final   Rhinovirus / Enterovirus NOT DETECTED NOT DETECTED Final   Influenza A NOT DETECTED NOT DETECTED Final   Influenza B NOT DETECTED NOT DETECTED Final   Parainfluenza Virus 1 NOT DETECTED NOT DETECTED Final   Parainfluenza Virus 2 NOT DETECTED NOT DETECTED Final   Parainfluenza Virus 3 NOT DETECTED NOT DETECTED Final   Parainfluenza Virus 4 NOT DETECTED NOT DETECTED Final   Respiratory Syncytial Virus NOT DETECTED NOT DETECTED Final   Bordetella pertussis NOT DETECTED NOT DETECTED Final   Bordetella Parapertussis NOT DETECTED NOT DETECTED Final   Chlamydophila pneumoniae NOT DETECTED NOT DETECTED Final   Mycoplasma pneumoniae NOT DETECTED NOT DETECTED Final    Comment: Performed at [facility], [place] [address] [place], [place] [address]     Labs: BNP (last 3 results) No results for input(s): "BNP" in the last 8760 hours. Basic Metabolic Panel: Recent Labs  Lab 12/06/21 1640 12/07/21 0516 12/08/21 0428  NA 143  --  144  K 4.5  --  3.5  CL 110  --  113*  CO2 27  --  23  GLUCOSE 102*  --  86  BUN 6  --  <5*  CREATININE 0.83 0.78 0.78  CALCIUM 9.1  --  8.6*   Liver Function Tests: Recent Labs  Lab 12/06/21 1640 12/08/21 0428  AST 54* 34  ALT 45* 33  ALKPHOS 120 97  BILITOT 1.3* 0.7  PROT 7.1 5.8*  ALBUMIN 3.5 3.0*   No results for input(s): "LIPASE", "AMYLASE" in the last 168 hours. No results for input(s): "AMMONIA" in the last 168 hours. CBC: Recent Labs  Lab 12/06/21 1608 12/07/21 0516 12/08/21 0428  WBC 6.7 6.0 4.9  NEUTROABS 4.1  --   --   HGB 12.6 11.2* 10.8*  HCT  40.2 35.6* 34.1*  MCV 94.8 96.0 95.3  PLT 221 218 211   Cardiac Enzymes: No results for input(s): "CKTOTAL", "CKMB", "CKMBINDEX", "TROPONINI" in the last 168 hours. BNP: Invalid input(s): "POCBNP" CBG: No results for input(s): "GLUCAP" in the last 168 hours. D-Dimer Recent Labs    12/06/21 1640  DDIMER 0.57*   Hgb A1c No results for input(s): "HGBA1C" in the last 72 hours. Lipid Profile No results for input(s): "CHOL", "HDL", "LDLCALC", "TRIG", "CHOLHDL", "LDLDIRECT" in the last 72 hours. Thyroid function studies No results for input(s): "TSH", "T4TOTAL", "T3FREE", "THYROIDAB" in the last 72 hours.  Invalid input(s): "FREET3" Anemia work up No results for input(s): "VITAMINB12", "FOLATE", "FERRITIN", "TIBC", "IRON", "RETICCTPCT" in the last 72 hours. Urinalysis    Component Value Date/Time   COLORURINE YELLOW (A) 12/06/2021 1940   APPEARANCEUR CLEAR (A) 12/06/2021 1940   LABSPEC 1.019 12/06/2021 1940   PHURINE 7.0 12/06/2021 1940   GLUCOSEU NEGATIVE 12/06/2021 1940   HGBUR NEGATIVE 12/06/2021 1940   BILIRUBINUR NEGATIVE 12/06/2021 1940   KETONESUR 5 (A) 12/06/2021 1940   PROTEINUR NEGATIVE 12/06/2021 1940   NITRITE NEGATIVE 12/06/2021 1940   LEUKOCYTESUR SMALL (A) 12/06/2021 1940   Sepsis Labs Recent Labs  Lab 12/06/21 1608 12/07/21 0516 12/08/21 0428  WBC 6.7 6.0 4.9   Microbiology Recent Results (from the past 240 hour(s))  SARS Coronavirus 2 by RT PCR (hospital order, performed in [place] hospital lab) *cepheid single result test* Anterior Nasal Swab     Status: None   Collection Time: 12/06/21  4:08 PM   Specimen: Anterior Nasal Swab  Result Value Ref Range Status   SARS Coronavirus 2 by RT  PCR NEGATIVE NEGATIVE Final    Comment: (NOTE) SARS-CoV-2 target nucleic acids are NOT DETECTED.  The SARS-CoV-2 RNA is generally detectable in upper and lower respiratory specimens during the acute phase of infection. The lowest concentration of SARS-CoV-2  viral copies this assay can detect is 250 copies / mL. A negative result does not preclude SARS-CoV-2 infection and should not be used as the sole basis for treatment or other patient management decisions.  A negative result may occur with improper specimen collection / handling, submission of specimen other than nasopharyngeal swab, presence of viral mutation(s) within the areas targeted by this assay, and inadequate number of viral copies (<250 copies / mL). A negative result must be combined with clinical observations, patient history, and epidemiological information.  Fact Sheet for Patients:   https://www.patel.info/  Fact Sheet for Healthcare Providers: https://hall.com/  This test is not yet approved or  cleared by the [place] FDA and has been authorized for detection and/or diagnosis of SARS-CoV-2 by FDA under an Emergency Use Authorization (EUA).  This EUA will remain in effect (meaning this test can be used) for the duration of the COVID-19 declaration under Section 564(b)(1) of the Act, 21 U.S.C. section 360bbb-3(b)(1), unless the authorization is terminated or revoked sooner.  Performed at [facility], [address] [place], [place] [address]   Culture, blood (routine x 2)     Status: None (Preliminary result)   Collection Time: 12/06/21  4:08 PM   Specimen: BLOOD LEFT FOREARM  Result Value Ref Range Status   Specimen Description BLOOD LEFT FOREARM  Final   Special Requests   Final    BOTTLES DRAWN AEROBIC AND ANAEROBIC Blood Culture results may not be optimal due to an inadequate volume of blood received in culture bottles   Culture   Final    NO GROWTH 2 DAYS Performed at [facility], [address], [place], [place] [address]    Report Status PENDING  Incomplete  Culture, blood (routine x 2)     Status: None (Preliminary result)   Collection Time: 12/06/21  4:08 PM   Specimen: BLOOD RIGHT  FOREARM  Result Value Ref Range Status   Specimen Description BLOOD RIGHT FOREARM  Final   Special Requests   Final    BOTTLES DRAWN AEROBIC AND ANAEROBIC Blood Culture adequate volume   Culture   Final    NO GROWTH 2 DAYS Performed at [facility], [address] [place], [place] [address]    Report Status PENDING  Incomplete  Respiratory (~20 pathogens) panel by PCR     Status: None   Collection Time: 12/07/21  5:16 AM   Specimen: Nasopharyngeal Swab; Respiratory  Result Value Ref Range Status   Adenovirus NOT DETECTED NOT DETECTED Final   Coronavirus 229E NOT DETECTED NOT DETECTED Final    Comment: (NOTE) The Coronavirus on the Respiratory Panel, DOES NOT test for the novel  Coronavirus (2019 nCoV)    Coronavirus HKU1 NOT DETECTED NOT DETECTED Final   Coronavirus NL63 NOT DETECTED NOT DETECTED Final   Coronavirus OC43 NOT DETECTED NOT DETECTED Final   Metapneumovirus NOT DETECTED NOT DETECTED Final   Rhinovirus / Enterovirus NOT DETECTED NOT DETECTED Final   Influenza A NOT DETECTED NOT DETECTED Final   Influenza B NOT DETECTED NOT DETECTED Final   Parainfluenza Virus 1 NOT DETECTED NOT DETECTED Final   Parainfluenza Virus 2 NOT DETECTED NOT DETECTED Final   Parainfluenza Virus 3 NOT DETECTED NOT DETECTED Final  Parainfluenza Virus 4 NOT DETECTED NOT DETECTED Final   Respiratory Syncytial Virus NOT DETECTED NOT DETECTED Final   Bordetella pertussis NOT DETECTED NOT DETECTED Final   Bordetella Parapertussis NOT DETECTED NOT DETECTED Final   Chlamydophila pneumoniae NOT DETECTED NOT DETECTED Final   Mycoplasma pneumoniae NOT DETECTED NOT DETECTED Final    Comment: Performed at [facility], [place] [address] [place], [place] [address]     Time coordinating discharge: Over 30 minutes  SIGNED:   Wyvonnia Dusky, MD  [facility] 12/08/2021, 3:30 PM Pager   If 7PM-7AM, please contact night-coverage [email or web address]

## 2021-12-08 NOTE — Progress Notes (Signed)
  Transition of Care [facility]) Screening Note   Patient Details  Name: ADALEENA MOOERS Date of Birth: 1976-03-11   Transition of Care [facility]) CM/SW Contact:    Gildardo Griffes, LCSW Phone Number: 12/08/2021, 10:19 AM    Transition of Care Department [employer]) has reviewed patient and no TOC needs have been identified at this time. We will continue to monitor patient advancement through interdisciplinary progression rounds. If new patient transition needs arise, please place a TOC consult.  [place], [place] [phone]

## 2021-12-11 LAB — CULTURE, BLOOD (ROUTINE X 2)
Culture: NO GROWTH
Culture: NO GROWTH
Special Requests: ADEQUATE

## 2022-03-09 ENCOUNTER — Other Ambulatory Visit: Payer: Self-pay

## 2022-03-09 DIAGNOSIS — Z1152 Encounter for screening for COVID-19: Secondary | ICD-10-CM | POA: Insufficient documentation

## 2022-03-09 DIAGNOSIS — R002 Palpitations: Secondary | ICD-10-CM | POA: Insufficient documentation

## 2022-03-09 LAB — URINALYSIS, ROUTINE W REFLEX MICROSCOPIC
Bilirubin Urine: NEGATIVE
Glucose, UA: NEGATIVE mg/dL
Hgb urine dipstick: NEGATIVE
Ketones, ur: 20 mg/dL — AB
Leukocytes,Ua: NEGATIVE
Nitrite: NEGATIVE
Protein, ur: NEGATIVE mg/dL
Specific Gravity, Urine: 1.014 (ref 1.005–1.030)
pH: 5 (ref 5.0–8.0)

## 2022-03-09 LAB — BASIC METABOLIC PANEL
Anion gap: 11 (ref 5–15)
BUN: 6 mg/dL (ref 6–20)
CO2: 26 mmol/L (ref 22–32)
Calcium: 9.4 mg/dL (ref 8.9–10.3)
Chloride: 103 mmol/L (ref 98–111)
Creatinine, Ser: 0.9 mg/dL (ref 0.44–1.00)
GFR, Estimated: >60 mL/min (ref >60)
Glucose, Bld: 107 mg/dL — ABNORMAL HIGH (ref 70–99)
Potassium: 3.7 mmol/L (ref 3.5–5.1)
Sodium: 140 mmol/L (ref 135–145)

## 2022-03-09 LAB — RESP PANEL BY RT-PCR (RSV, FLU A&B, COVID)  RVPGX2
Influenza A by PCR: NEGATIVE
Influenza B by PCR: NEGATIVE
Resp Syncytial Virus by PCR: NEGATIVE
SARS Coronavirus 2 by RT PCR: NEGATIVE

## 2022-03-09 LAB — T4, FREE: Free T4: 0.85 ng/dL (ref 0.61–1.12)

## 2022-03-09 LAB — CBC
HCT: 43.5 % (ref 36.0–46.0)
Hemoglobin: 14 g/dL (ref 12.0–15.0)
MCH: 30.8 pg (ref 26.0–34.0)
MCHC: 32.2 g/dL (ref 30.0–36.0)
MCV: 95.8 fL (ref 80.0–100.0)
Platelets: 243 10*3/uL (ref 150–400)
RBC: 4.54 MIL/uL (ref 3.87–5.11)
RDW: 13.6 % (ref 11.5–15.5)
WBC: 5.7 10*3/uL (ref 4.0–10.5)
nRBC: 0 % (ref 0.0–0.2)

## 2022-03-09 LAB — TROPONIN I (HIGH SENSITIVITY)
Troponin I (High Sensitivity): <2 ng/L (ref <18)
Troponin I (High Sensitivity): 3 ng/L (ref <18)

## 2022-03-09 LAB — MAGNESIUM: Magnesium: 2 mg/dL (ref 1.7–2.4)

## 2022-03-09 LAB — TSH: TSH: 1.223 u[IU]/mL (ref 0.350–4.500)

## 2022-03-09 NOTE — ED Notes (Signed)
Pt ambulatory to STAT desk without difficulty or distress noted; c/o SHOB and CP; vs retaken; pt updated on wait time; pt upset over such but voices understanding

## 2022-03-09 NOTE — ED Notes (Signed)
First Nurse Note: Pt to ED via POV for rapid HR.

## 2022-03-09 NOTE — ED Triage Notes (Addendum)
Just had two teeth pulled and they noticied her HR was high and her BP was high  Reports she has had two syncopal episodes.  Also reports having fever and night sweats for 1 week.

## 2022-03-10 ENCOUNTER — Emergency Department: Payer: BC Managed Care – PPO

## 2022-03-10 ENCOUNTER — Emergency Department
Admission: EM | Admit: 2022-03-10 | Discharge: 2022-03-10 | Disposition: A | Discharge status: Home / Self Care | Payer: BC Managed Care – PPO | Attending: Emergency Medicine | Admitting: Emergency Medicine

## 2022-03-10 DIAGNOSIS — R002 Palpitations: Secondary | ICD-10-CM

## 2022-03-10 NOTE — Discharge Instructions (Signed)
Call cardiology for a follow up   Thank you for choosing [place] for your health care today!  Please see your primary doctor this week for a follow up appointment.   Sometimes, in the early stages of certain disease courses it is difficult to detect in the emergency department evaluation -- so, it is important that you continue to monitor your symptoms and call your doctor right away or return to the emergency department if you develop any new or worsening symptoms.  Please go to the following website to schedule new (and existing) patient appointments:   [email or web address]  If you do not have a primary doctor try calling the following clinics to establish care:  If you have insurance:  [facility] [phone] [address] [place] [place] [address]   [facility]  [phone] [address] [place] [place] [address]   If you do not have insurance:  [facility]  [phone] [address], [place] [address]   The following is another list of primary care offices in the area who are accepting new patients at this time.  Please reach out to one of them directly and let them know you would like to schedule an appointment to follow up on an Emergency Department visit, and/or to establish a new primary care provider (PCP).  There are likely other primary care clinics in the are who are accepting new patients, but this is an excellent place to start:  [facility] Lead physician: Dr Shirlee Latch [address] [place], [place] [address] [phone]  [facility] Lead Physician: Dr Alba Cory [address], [place], [place] [address] [phone]  [facility]  Lead Physician: Dr Olevia Perches [address] [place], [place] [address] [phone]  [facility] Lead Physician: Dr Sofie Hartigan [address] [place], [place], [place] [address] [phone]  [facility] Lead Physician: Dr Bari Edward [address] [place], [place] [address] [phone]   It was my pleasure to care for you today.   Daneil Dan Modesto Charon, MD

## 2022-03-10 NOTE — ED Provider Notes (Signed)
[facility] Provider Note    Event Date/Time   First MD Initiated Contact with Patient 03/10/22 [phone]     (approximate)   History   Palpitations   HPI  Audrey [name] is a 46 y.o. female   Past medical history of schizophrenia, depression, anxiety, arthritis, PTSD presents with palpitations.  She states that these palpitations have been going on for weeks, sensation of rapid heartbeat and pounding heartbeat in her left chest that spontaneously resolves.  She denies chest pain.  She has had a mild cough over the last several weeks as well and subjective fever/chills.  SHe reports several syncopal episodes over the last few days, and reports history of syncopal episodes in the past as well.  She has no other acute medical complaints.  History was obtained via the patient. I reviewed external medical notes including a emergency department visit in September 2023 when she obtained a CT angiogram of the chest for chest pain/cough which was negative for pulmonary embolism but showed evidence of pneumonia.      Physical Exam   Triage Vital Signs: ED Triage Vitals  Enc Vitals Group     BP 03/09/22 1557 (!) 151/132     Pulse Rate 03/09/22 1557 (!) 129     Resp 03/09/22 1557 20     Temp 03/09/22 1557 98.6 F (37 C)     Temp Source 03/09/22 1557 Oral     SpO2 03/09/22 1557 100 %     Weight 03/09/22 1556 276 lb (125.2 kg)     Height 03/09/22 1556 5\' 3"  (1.6 m)     Head Circumference --      Peak Flow --      Pain Score --      Pain Loc --      Pain Edu? --      Excl. in [place]? --     Most recent vital signs: Vitals:   03/10/22 0432 03/10/22 0530  BP: (!) 134/95 (!) 149/95  Pulse: (!) 119 (!) 112  Resp: 20 20  Temp: 98.4 F (36.9 C) 98.3 F (36.8 C)  SpO2: 100% 100%    General: Awake, no distress.  CV:  Good peripheral perfusion.  Resp:  Normal effort.  Abd:  No distention.  Other:  She is tachycardic anywhere from 110-140s with  normal sinus rhythm on the monitor.  She is hypertensive 130 over 90s.  Afebrile.  She is alert and oriented.  Heart sounds are regular rhythm, tachycardic, lungs clear, abdomen soft and nontender    ED Results / Procedures / Treatments   Labs (all labs ordered are listed, but only abnormal results are displayed) Labs Reviewed  BASIC METABOLIC PANEL - Abnormal; Notable for the following components:      Result Value   Glucose, Bld 107 (*)    All other components within normal limits  URINALYSIS, ROUTINE W REFLEX MICROSCOPIC - Abnormal; Notable for the following components:   Color, Urine AMBER (*)    APPearance CLOUDY (*)    Ketones, ur 20 (*)    All other components within normal limits  RESP PANEL BY RT-PCR (RSV, FLU A&B, COVID)  RVPGX2  CBC  MAGNESIUM  TSH  T4, FREE  TROPONIN I (HIGH SENSITIVITY)  TROPONIN I (HIGH SENSITIVITY)     I reviewed labs and they are notable for negative troponin x 2  EKG  ED ECG REPORT I, 14/12/23, the attending physician, personally viewed and interpreted this  ECG.   Date: 03/10/2022  EKG Time: 1553  Rate: 143  Rhythm: Sinus tachycardia  Axis: nl  Intervals:none  ST&T Change: No acute ischemic changes    PROCEDURES:  Critical Care performed: No  Procedures   MEDICATIONS ORDERED IN ED: Medications - No data to display  IMPRESSION / MDM / ASSESSMENT AND PLAN / ED COURSE  I reviewed the triage vital signs and the nursing notes.                              Differential diagnosis includes, but is not limited to, cardiogenic syncope, vasovagal syncope, orthostatic hypotension, dehydration electrolyte disturbances, thyroid abnormalities, dysrhythmia, infection   MDM: This patient has had palpitations for several weeks along with a mild cough, will check a chest x-ray for pneumonia.  She appears to be in normal sinus rhythm and on my recheck in the room she has a heart rate in the 110s, and a review of prior EKG shows  consistently tachycardic in the past.  I doubt she has a pulmonary embolism, and she had a very recent CT angiogram that was negative for pulmonary embolism, Her tachycardia is longstanding as well as her syncopal episodes, and she denies chest pain or shortness of breath currently.  I doubt she has ACS given nonischemic EKG with negative troponins.  Will check for pna on cxr otherwise I will ask her to follow-up with cardiologist for Holter monitor.  Patient's presentation is most consistent with acute presentation with potential threat to life or bodily function.       FINAL CLINICAL IMPRESSION(S) / ED DIAGNOSES   Final diagnoses:  Palpitations     Rx / DC Orders   ED Discharge Orders     None        Note:  This document was prepared using Dragon voice recognition software and may include unintentional dictation errors.    Pilar Jarvis, MD 03/10/22 [phone]

## 2022-03-10 NOTE — ED Notes (Signed)
E-signature pad unavailable - Pt verbalized understanding of D/C information - no additional concerns at this time.  

## 2022-04-10 ENCOUNTER — Other Ambulatory Visit: Payer: Self-pay

## 2022-04-10 ENCOUNTER — Emergency Department: Payer: BC Managed Care – PPO

## 2022-04-10 ENCOUNTER — Emergency Department
Admission: EM | Admit: 2022-04-10 | Discharge: 2022-04-10 | Disposition: A | Discharge status: Home / Self Care | Payer: BC Managed Care – PPO | Attending: Emergency Medicine | Admitting: Emergency Medicine

## 2022-04-10 DIAGNOSIS — R509 Fever, unspecified: Secondary | ICD-10-CM | POA: Diagnosis present

## 2022-04-10 DIAGNOSIS — R7989 Other specified abnormal findings of blood chemistry: Secondary | ICD-10-CM | POA: Insufficient documentation

## 2022-04-10 DIAGNOSIS — J101 Influenza due to other identified influenza virus with other respiratory manifestations: Secondary | ICD-10-CM | POA: Diagnosis not present

## 2022-04-10 DIAGNOSIS — Z20822 Contact with and (suspected) exposure to covid-19: Secondary | ICD-10-CM | POA: Diagnosis not present

## 2022-04-10 LAB — COMPREHENSIVE METABOLIC PANEL
ALT: 48 U/L — ABNORMAL HIGH (ref 0–44)
AST: 64 U/L — ABNORMAL HIGH (ref 15–41)
Albumin: 3.6 g/dL (ref 3.5–5.0)
Alkaline Phosphatase: 87 U/L (ref 38–126)
Anion gap: 12 (ref 5–15)
BUN: 8 mg/dL (ref 6–20)
CO2: 24 mmol/L (ref 22–32)
Calcium: 8.4 mg/dL — ABNORMAL LOW (ref 8.9–10.3)
Chloride: 103 mmol/L (ref 98–111)
Creatinine, Ser: 0.77 mg/dL (ref 0.44–1.00)
GFR, Estimated: >60 mL/min (ref >60)
Glucose, Bld: 78 mg/dL (ref 70–99)
Potassium: 3.6 mmol/L (ref 3.5–5.1)
Sodium: 139 mmol/L (ref 135–145)
Total Bilirubin: 1.4 mg/dL — ABNORMAL HIGH (ref 0.3–1.2)
Total Protein: 7.3 g/dL (ref 6.5–8.1)

## 2022-04-10 LAB — CBC WITH DIFFERENTIAL/PLATELET
Abs Immature Granulocytes: 0.01 10*3/uL (ref 0.00–0.07)
Basophils Absolute: 0 10*3/uL (ref 0.0–0.1)
Basophils Relative: 1 %
Eosinophils Absolute: 0.2 10*3/uL (ref 0.0–0.5)
Eosinophils Relative: 5 %
HCT: 43.1 % (ref 36.0–46.0)
Hemoglobin: 13.8 g/dL (ref 12.0–15.0)
Immature Granulocytes: 0 %
Lymphocytes Relative: 57 %
Lymphs Abs: 1.9 10*3/uL (ref 0.7–4.0)
MCH: 30.5 pg (ref 26.0–34.0)
MCHC: 32 g/dL (ref 30.0–36.0)
MCV: 95.1 fL (ref 80.0–100.0)
Monocytes Absolute: 0.2 10*3/uL (ref 0.1–1.0)
Monocytes Relative: 6 %
Neutro Abs: 1.1 10*3/uL — ABNORMAL LOW (ref 1.7–7.7)
Neutrophils Relative %: 31 %
Platelets: 131 10*3/uL — ABNORMAL LOW (ref 150–400)
RBC: 4.53 MIL/uL (ref 3.87–5.11)
RDW: 14.4 % (ref 11.5–15.5)
WBC: 3.4 10*3/uL — ABNORMAL LOW (ref 4.0–10.5)
nRBC: 0 % (ref 0.0–0.2)

## 2022-04-10 LAB — RESP PANEL BY RT-PCR (RSV, FLU A&B, COVID)  RVPGX2
Influenza A by PCR: POSITIVE — AB
Influenza B by PCR: NEGATIVE
Resp Syncytial Virus by PCR: NEGATIVE
SARS Coronavirus 2 by RT PCR: NEGATIVE

## 2022-04-10 LAB — TROPONIN I (HIGH SENSITIVITY): Troponin I (High Sensitivity): 5 ng/L (ref <18)

## 2022-04-10 MED ORDER — ALBUTEROL SULFATE HFA 108 (90 BASE) MCG/ACT IN AERS: 2.0000 | INHALATION_SPRAY | Freq: Four times a day (QID) | RESPIRATORY_TRACT | 2 refills | Status: AC | PRN

## 2022-04-10 MED ORDER — DIPHENHYDRAMINE HCL 50 MG/ML IJ SOLN
25.0000 mg | Freq: Once | INTRAMUSCULAR | Status: AC
  Administered 2022-04-10: 25 mg via INTRAVENOUS
  Filled 2022-04-10: qty 1

## 2022-04-10 MED ORDER — SODIUM CHLORIDE 0.9 % IV BOLUS
1000.0000 mL | Freq: Once | INTRAVENOUS | Status: AC
  Administered 2022-04-10: 1000 mL via INTRAVENOUS

## 2022-04-10 MED ORDER — PREDNISONE 10 MG PO TABS: 10.0000 mg | ORAL_TABLET | Freq: Every day | ORAL | 0 refills | Status: DC

## 2022-04-10 MED ORDER — METOCLOPRAMIDE HCL 5 MG/ML IJ SOLN: 10.0000 mg | Freq: Once | INTRAMUSCULAR | Status: DC

## 2022-04-10 MED ORDER — KETOROLAC TROMETHAMINE 30 MG/ML IJ SOLN
30.0000 mg | Freq: Once | INTRAMUSCULAR | Status: AC
  Administered 2022-04-10: 30 mg via INTRAVENOUS
  Filled 2022-04-10: qty 1

## 2022-04-10 MED ORDER — GUAIFENESIN-CODEINE 100-10 MG/5ML PO SOLN: 5.0000 mL | Freq: Four times a day (QID) | ORAL | 0 refills | Status: DC | PRN

## 2022-04-10 MED ORDER — ONDANSETRON HCL 4 MG/2ML IJ SOLN
4.0000 mg | Freq: Once | INTRAMUSCULAR | Status: AC
  Administered 2022-04-10: 4 mg via INTRAVENOUS
  Filled 2022-04-10: qty 2

## 2022-04-10 MED ORDER — IPRATROPIUM-ALBUTEROL 0.5-2.5 (3) MG/3ML IN SOLN
3.0000 mL | Freq: Once | RESPIRATORY_TRACT | Status: AC
  Administered 2022-04-10: 3 mL via RESPIRATORY_TRACT
  Filled 2022-04-10: qty 3

## 2022-04-10 MED ORDER — METHYLPREDNISOLONE SODIUM SUCC 125 MG IJ SOLR
125.0000 mg | Freq: Once | INTRAMUSCULAR | Status: AC
  Administered 2022-04-10: 125 mg via INTRAVENOUS
  Filled 2022-04-10: qty 2

## 2022-04-10 NOTE — Discharge Instructions (Addendum)
You have been prescribed a prednisone taper, please take this medication starting tomorrow 04/11/2022 as prescribed.  You have been prescribed a cough medication.  As we discussed do not drink alcohol or drive while taking this medication.  Do not take any other pain medication while taking this medication.  Please drink plenty fluids use Tylenol or ibuprofen as needed for discomfort/fever.  Return to the emergency department for any trouble breathing or any other symptom personally concerning to yourself.  Otherwise please follow-up with your doctor.

## 2022-04-10 NOTE — ED Provider Notes (Signed)
[facility] Provider Note    Event Date/Time   First MD Initiated Contact with Patient 04/10/22 1320     (approximate)  History   Chief Complaint: Shortness of Breath  HPI  Audrey [name] is a 47 y.o. female with a past medical history of anxiety, PTSD, schizophrenia, presents emergency department for 8 days of cough congestion fever chills.  According to the patient for the past 8 days she has had cough with some wheeze congestion subjective fever chills and bodyaches.  States has been started with symptoms approximately for 5 days before she did.  Patient states some chest discomfort as well as ongoing shortness of breath.  Physical Exam   Triage Vital Signs: ED Triage Vitals [04/10/22 1305]  Enc Vitals Group     BP (!) 132/105     Pulse Rate (!) 122     Resp (!) 21     Temp 98 F (36.7 C)     Temp Source Oral     SpO2 94 %     Weight      Height      Head Circumference      Peak Flow      Pain Score 7     Pain Loc      Pain Edu?      Excl. in [place]?     Most recent vital signs: Vitals:   04/10/22 1305 04/10/22 1322  BP: (!) 132/105   Pulse: (!) 122 (!) 123  Resp: (!) 21   Temp: 98 F (36.7 C)   SpO2: 94% 97%    General: Awake, no distress.  CV:  Good peripheral perfusion.  Regular rhythm rate around 120 bpm. Resp:  Normal effort.  Equal breath sounds bilaterally.  Mild expiratory wheezes bilaterally. Abd:  No distention.  Soft, nontender.  No rebound or guarding.    ED Results / Procedures / Treatments   EKG  EKG viewed and interpreted by myself shows sinus tachycardia 128 bpm with a narrow QRS, normal axis, normal intervals besides slight QTc prolongation, no concerning ST changes.  RADIOLOGY  Chest x-ray viewed and interpreted by myself shows no consolidation.  Chest x-ray read as negative by radiology.   MEDICATIONS ORDERED IN ED: Medications  sodium chloride 0.9 % bolus 1,000 mL (has no administration in time  range)  ketorolac (TORADOL) 30 MG/ML injection 30 mg (has no administration in time range)  methylPREDNISolone sodium succinate (SOLU-MEDROL) 125 mg/2 mL injection 125 mg (has no administration in time range)  diphenhydrAMINE (BENADRYL) injection 25 mg (has no administration in time range)  ondansetron (ZOFRAN) injection 4 mg (has no administration in time range)  ipratropium-albuterol (DUONEB) 0.5-2.5 (3) MG/3ML nebulizer solution 3 mL (3 mLs Nebulization Given 04/10/22 1320)     IMPRESSION / MDM / ASSESSMENT AND PLAN / ED COURSE  I reviewed the triage vital signs and the nursing notes.  Patient's presentation is most consistent with acute presentation with potential threat to life or bodily function.  Patient presents emergency department for moderate days of cough congestion subjective fever chills wheeze.  Overall patient appears well she does have a frequent cough.  States she has had several episodes of diarrhea recently as well.  Husband started with symptoms for 5 days before she did.  Symptoms are very suggestive of viral illness upper respiratory infection.  She does have a slight expiratory wheeze, given DuoNeb.  She also states a headache we will dose Toradol Zofran Benadryl.  Given the patient's continued wheeze despite symptoms ongoing for 8 days we will dose Solu-Medrol for likely degree of reactive airway disease or airway inflammation.  Patient's labs have resulted showing a reassuring CBC with a normal white blood cell count, chemistry shows slight LFT elevation no concerning findings.  Troponin negative.  Patient's viral panel resulted positive for influenza A which I believe this explains the majority of the patient's symptoms.  Given the patient's continued symptoms and wheeze we will place the patient on a prednisone taper discussed otherwise supportive care including Tylenol or ibuprofen lots of fluids and rest.  FINAL CLINICAL IMPRESSION(S) / ED DIAGNOSES   Influenza  A  Rx / DC Orders   Prednisone Cough medication Albuterol  Note:  This document was prepared using Dragon voice recognition software and may include unintentional dictation errors.   Harvest Dark, MD 04/10/22 1419

## 2022-04-10 NOTE — ED Triage Notes (Addendum)
Pt c/o SHOB, non-productive cough, N/V/D, and body aches X1 week. Expiratory wheeze noted to auscultation bilaterally. Pt c/o dizziness w/ exertion. Pt AOX4, appears uncomfortable, NAD noted. Pt denies COPD/CHF. Pt able to speak in full sentences. Pt states "my heart hurts."

## 2022-06-13 ENCOUNTER — Emergency Department: Payer: BC Managed Care – PPO

## 2022-06-13 ENCOUNTER — Other Ambulatory Visit: Payer: Self-pay

## 2022-06-13 ENCOUNTER — Emergency Department
Admission: EM | Admit: 2022-06-13 | Discharge: 2022-06-13 | Disposition: A | Discharge status: Home / Self Care | Payer: BC Managed Care – PPO | Attending: Emergency Medicine | Admitting: Emergency Medicine

## 2022-06-13 DIAGNOSIS — M545 Low back pain, unspecified: Secondary | ICD-10-CM | POA: Insufficient documentation

## 2022-06-13 DIAGNOSIS — M79605 Pain in left leg: Secondary | ICD-10-CM | POA: Insufficient documentation

## 2022-06-13 DIAGNOSIS — G894 Chronic pain syndrome: Secondary | ICD-10-CM | POA: Diagnosis present

## 2022-06-13 DIAGNOSIS — M79604 Pain in right leg: Secondary | ICD-10-CM | POA: Diagnosis not present

## 2022-06-13 DIAGNOSIS — I10 Essential (primary) hypertension: Secondary | ICD-10-CM | POA: Insufficient documentation

## 2022-06-13 LAB — BASIC METABOLIC PANEL
Anion gap: 4 — ABNORMAL LOW (ref 5–15)
BUN: 13 mg/dL (ref 6–20)
CO2: 27 mmol/L (ref 22–32)
Calcium: 9.5 mg/dL (ref 8.9–10.3)
Chloride: 107 mmol/L (ref 98–111)
Creatinine, Ser: 0.67 mg/dL (ref 0.44–1.00)
GFR, Estimated: >60 mL/min (ref >60)
Glucose, Bld: 110 mg/dL — ABNORMAL HIGH (ref 70–99)
Potassium: 3.6 mmol/L (ref 3.5–5.1)
Sodium: 138 mmol/L (ref 135–145)

## 2022-06-13 LAB — CBC
HCT: 41.5 % (ref 36.0–46.0)
Hemoglobin: 13.2 g/dL (ref 12.0–15.0)
MCH: 30.9 pg (ref 26.0–34.0)
MCHC: 31.8 g/dL (ref 30.0–36.0)
MCV: 97.2 fL (ref 80.0–100.0)
Platelets: 209 10*3/uL (ref 150–400)
RBC: 4.27 MIL/uL (ref 3.87–5.11)
RDW: 15.6 % — ABNORMAL HIGH (ref 11.5–15.5)
WBC: 6.5 10*3/uL (ref 4.0–10.5)
nRBC: 0 % (ref 0.0–0.2)

## 2022-06-13 MED ORDER — DIAZEPAM 5 MG/ML IJ SOLN
5.0000 mg | Freq: Once | INTRAMUSCULAR | Status: AC
  Administered 2022-06-13: 5 mg via INTRAVENOUS
  Filled 2022-06-13: qty 2

## 2022-06-13 MED ORDER — HYDROMORPHONE HCL 1 MG/ML IJ SOLN
0.5000 mg | Freq: Once | INTRAMUSCULAR | Status: AC
  Administered 2022-06-13: 0.5 mg via INTRAVENOUS
  Filled 2022-06-13: qty 0.5

## 2022-06-13 MED ORDER — HYDROMORPHONE HCL 2 MG PO TABS
2.0000 mg | ORAL_TABLET | ORAL | Status: AC
  Administered 2022-06-13: 2 mg via ORAL
  Filled 2022-06-13: qty 1

## 2022-06-13 MED ORDER — ACETAMINOPHEN 500 MG PO TABS
1000.0000 mg | ORAL_TABLET | Freq: Once | ORAL | Status: AC
  Administered 2022-06-13: 1000 mg via ORAL
  Filled 2022-06-13: qty 2

## 2022-06-13 MED ORDER — SODIUM CHLORIDE 0.9 % IV BOLUS
500.0000 mL | Freq: Once | INTRAVENOUS | Status: AC
  Administered 2022-06-13: 500 mL via INTRAVENOUS

## 2022-06-13 MED ORDER — HYDROMORPHONE HCL 1 MG/ML IJ SOLN
1.0000 mg | Freq: Once | INTRAMUSCULAR | Status: AC
  Administered 2022-06-13: 1 mg via INTRAVENOUS
  Filled 2022-06-13: qty 1

## 2022-06-13 MED ORDER — HYDROMORPHONE HCL 1 MG/ML IJ SOLN: 1.0000 mg | Freq: Once | INTRAMUSCULAR | Status: DC

## 2022-06-13 NOTE — ED Notes (Signed)
RN to bedside to introduce self to pt. Pt is CAOx4 and in no acute distress.  ?

## 2022-06-13 NOTE — ED Provider Notes (Signed)
Procedures     ----------------------------------------- 4:41 PM on 06/13/2022 ----------------------------------------- Ultrasound left lower extremity negative.  Symptoms appear to be due to her chronic low back pain and leg pain from multiple knee and back surgeries.  She has an appointment with her pain management specialist in 3 days.  In the meantime, I advised her she can increase her oral Dilaudid dosing.  She has been on these high-dose opioids for many years, and I think risk of overdose is very low.  She and her husband are both comfortable monitoring for adverse effects.     Carrie Mew, MD 06/13/22 [phone]

## 2022-06-13 NOTE — ED Provider Notes (Addendum)
[facility] Provider Note    Event Date/Time   First MD Initiated Contact with Patient 06/13/22 1354     (approximate)   History   No chief complaint on file.   HPI  Audrey [name] is a 47 y.o. female   Past medical history of pseudotumor cerebri, chronic pain, PTSD, anxiety depression, schizoaffective schizophrenia who presents to the emergency department with exacerbation of chronic pain.  She states that from her lower back all the way down to bilateral feet she has had chronic pain for the last 20 years.  The pain today is similar in quality and location except more severe.  She has been taking her multimodal pain medications at home without relief.  Pain has been worsening over the last 1 week.  It seems to be more on the left leg than other areas though everything hurts.  Denies trauma, falls, injury.  She denies any other acute medical complaints like chest pain, respiratory symptoms, fever, chills, urinary symptoms.  Independent Historian contributed to assessment above: Husband who is at bedside  External Medical Documents Reviewed: January 2024 emergency department visit diagnosed with influenza      Physical Exam   Triage Vital Signs: ED Triage Vitals [06/13/22 1344]  Enc Vitals Group     BP (!) 154/108     Pulse Rate (!) 130     Resp 18     Temp 98 F (36.7 C)     Temp Source Oral     SpO2 98 %     Weight      Height      Head Circumference      Peak Flow      Pain Score 10     Pain Loc      Pain Edu?      Excl. in [place]?     Most recent vital signs: Vitals:   06/13/22 1344  BP: (!) 154/108  Pulse: (!) 130  Resp: 18  Temp: 98 F (36.7 C)  SpO2: 98%    General: Awake, no distress.  CV:  Good peripheral perfusion.  Resp:  Normal effort.  Abd:  No distention.  Other:  Awake uncomfortable appearing she has pain to palpation to the bilateral lower back all the way down through bilateral lower extremities to the feet.   Lower extremities appear warm and well-perfused.  She is able to range.  Abdomen is soft and nontender and lungs are clear.  Hemodynamics significant for hypertension 150/100 and tachycardia 130 but afebrile no tachypnea no hypoxemia   ED Results / Procedures / Treatments   Labs (all labs ordered are listed, but only abnormal results are displayed) Labs Reviewed  BASIC METABOLIC PANEL - Abnormal; Notable for the following components:      Result Value   Glucose, Bld 110 (*)    Anion gap 4 (*)    All other components within normal limits  CBC - Abnormal; Notable for the following components:   RDW 15.6 (*)    All other components within normal limits     I ordered and reviewed the above labs they are notable for normal white blood cell count  EKG  ED ECG REPORT I, Lucillie Garfinkel, the attending physician, personally viewed and interpreted this ECG.   Date: 06/13/2022  EKG Time: 1351  Rate: 129  Rhythm: sinus tachycardia  Axis: nl  Intervals:none  ST&T Change: No acute ischemic changes, limited by lack of V6 lead.  PROCEDURES:  Critical Care performed: No  Procedures   MEDICATIONS ORDERED IN ED: Medications  acetaminophen (TYLENOL) tablet 1,000 mg (has no administration in time range)  HYDROmorphone (DILAUDID) injection 0.5 mg (has no administration in time range)  HYDROmorphone (DILAUDID) injection 1 mg (1 mg Intravenous Given 06/13/22 1420)  diazepam (VALIUM) injection 5 mg (5 mg Intravenous Given 06/13/22 1420)  sodium chloride 0.9 % bolus 500 mL (500 mLs Intravenous New Bag/Given 06/13/22 1420)     IMPRESSION / MDM / ASSESSMENT AND PLAN / ED COURSE  I reviewed the triage vital signs and the nursing notes.                                Patient's presentation is most consistent with acute presentation with potential threat to life or bodily function.  Differential diagnosis includes, but is not limited to, chronic pain syndrome, withdrawal from  benzodiazepines or narcotics, DVT, considered but less likely septic joint, fracture/dislocation   The patient is on the cardiac monitor to evaluate for evidence of arrhythmia and/or significant heart rate changes.  MDM: This is a patient with chronic pain that is worsening over the last week despite taking her home medications.  She has pain that is in the regular distribution though has slightly increased pain to the left lower extremity behind the knee, will get an ultrasound to evaluate for DVT.  Does not look like a septic joint.  Will give her IV benzodiazepine and IV pain medications Dilaudid pending workup.  Doubt ischemia given bilateral pain with palpable pulses. No trauma or new meds to suggest rhabdo or myositis.    At the time of signout, ultrasound results pending for DVT.       FINAL CLINICAL IMPRESSION(S) / ED DIAGNOSES   Final diagnoses:  Chronic pain syndrome     Rx / DC Orders   ED Discharge Orders     None        Note:  This document was prepared using Dragon voice recognition software and may include unintentional dictation errors.      Lucillie Garfinkel, MD 06/13/22 [phone]

## 2022-06-13 NOTE — ED Triage Notes (Signed)
Pt to ED via POV from home. Pt reports she has chronic pain that has gotten worse. Pt reports hx osteoarthritis, fusions and bilateral feet/leg pain. Pt reports now pain is unmanageable and is unable to walk.

## 2022-06-13 NOTE — Discharge Instructions (Addendum)
Your lab tests and ultrasound of the left leg were okay today.   Until you are able to see your pain management specialist, to manage your increased pain, you can increase your dilaudid tablet dose to 4mg  every 6 hours.  Take the increased dose only when necessary until this acute pain episode improves.

## 2022-07-21 ENCOUNTER — Emergency Department: Payer: BC Managed Care – PPO

## 2022-07-21 DIAGNOSIS — R232 Flushing: Secondary | ICD-10-CM | POA: Insufficient documentation

## 2022-07-21 DIAGNOSIS — R000 Tachycardia, unspecified: Secondary | ICD-10-CM | POA: Insufficient documentation

## 2022-07-21 LAB — CBC WITH DIFFERENTIAL/PLATELET
Abs Immature Granulocytes: 0.02 10*3/uL (ref 0.00–0.07)
Basophils Absolute: 0.1 10*3/uL (ref 0.0–0.1)
Basophils Relative: 1 %
Eosinophils Absolute: 0.3 10*3/uL (ref 0.0–0.5)
Eosinophils Relative: 8 %
HCT: 41.3 % (ref 36.0–46.0)
Hemoglobin: 13.1 g/dL (ref 12.0–15.0)
Immature Granulocytes: 1 %
Lymphocytes Relative: 42 %
Lymphs Abs: 1.8 10*3/uL (ref 0.7–4.0)
MCH: 30.4 pg (ref 26.0–34.0)
MCHC: 31.7 g/dL (ref 30.0–36.0)
MCV: 95.8 fL (ref 80.0–100.0)
Monocytes Absolute: 0.3 10*3/uL (ref 0.1–1.0)
Monocytes Relative: 7 %
Neutro Abs: 1.7 10*3/uL (ref 1.7–7.7)
Neutrophils Relative %: 41 %
Platelets: 229 10*3/uL (ref 150–400)
RBC: 4.31 MIL/uL (ref 3.87–5.11)
RDW: 12.9 % (ref 11.5–15.5)
WBC: 4.2 10*3/uL (ref 4.0–10.5)
nRBC: 0 % (ref 0.0–0.2)

## 2022-07-21 LAB — COMPREHENSIVE METABOLIC PANEL
ALT: 38 U/L (ref 0–44)
AST: 64 U/L — ABNORMAL HIGH (ref 15–41)
Albumin: 3.6 g/dL (ref 3.5–5.0)
Alkaline Phosphatase: 100 U/L (ref 38–126)
Anion gap: 7 (ref 5–15)
BUN: 6 mg/dL (ref 6–20)
CO2: 27 mmol/L (ref 22–32)
Calcium: 9.3 mg/dL (ref 8.9–10.3)
Chloride: 105 mmol/L (ref 98–111)
Creatinine, Ser: 0.83 mg/dL (ref 0.44–1.00)
GFR, Estimated: >60 mL/min (ref >60)
Glucose, Bld: 140 mg/dL — ABNORMAL HIGH (ref 70–99)
Potassium: 3.2 mmol/L — ABNORMAL LOW (ref 3.5–5.1)
Sodium: 139 mmol/L (ref 135–145)
Total Bilirubin: 0.9 mg/dL (ref 0.3–1.2)
Total Protein: 6.8 g/dL (ref 6.5–8.1)

## 2022-07-21 LAB — URINALYSIS, ROUTINE W REFLEX MICROSCOPIC
Bacteria, UA: NONE SEEN
Bilirubin Urine: NEGATIVE
Glucose, UA: NEGATIVE mg/dL
Hgb urine dipstick: NEGATIVE
Ketones, ur: 5 mg/dL — AB
Leukocytes,Ua: NEGATIVE
Nitrite: NEGATIVE
Protein, ur: 30 mg/dL — AB
Specific Gravity, Urine: 1.024 (ref 1.005–1.030)
pH: 5 (ref 5.0–8.0)

## 2022-07-21 LAB — TROPONIN I (HIGH SENSITIVITY): Troponin I (High Sensitivity): 3 ng/L (ref <18)

## 2022-07-21 NOTE — ED Triage Notes (Signed)
To triage via wheelchair by [facility] Pt states she took night meds and 15 mins expereinced full body hot flash then she became cold and clammy. Afterwards had fatigue, nausea and vomiting.  No new medications were taken but dosage was increased on one of them but she can't recall which one. Pt states she is also out of Klonopin and unable to get a refill. NAD noted in triage. Pt sitting in wheelchair, speaking in clear and complete sentences. No active vomiting.

## 2022-07-21 NOTE — ED Triage Notes (Signed)
EMS brings pt in for c/o chills and weakness, CP

## 2022-07-22 ENCOUNTER — Other Ambulatory Visit: Payer: Self-pay

## 2022-07-22 ENCOUNTER — Emergency Department
Admission: EM | Admit: 2022-07-22 | Discharge: 2022-07-22 | Disposition: A | Discharge status: Home / Self Care | Payer: BC Managed Care – PPO | Attending: Emergency Medicine | Admitting: Emergency Medicine

## 2022-07-22 DIAGNOSIS — R232 Flushing: Secondary | ICD-10-CM | POA: Diagnosis not present

## 2022-07-22 LAB — TROPONIN I (HIGH SENSITIVITY): Troponin I (High Sensitivity): 3 ng/L (ref <18)

## 2022-07-22 MED ORDER — CLONAZEPAM 0.5 MG PO TABS
1.0000 mg | ORAL_TABLET | Freq: Once | ORAL | Status: AC
  Administered 2022-07-22: 1 mg via ORAL
  Filled 2022-07-22: qty 2

## 2022-07-22 NOTE — ED Provider Notes (Signed)
[facility] Provider Note    Event Date/Time   First MD Initiated Contact with Patient 07/22/22 0006     (approximate)   History   Abdominal Pain   HPI  Audrey [name] is a 47 y.o. female   Past medical history of anxiety, arthritis, depression, pseudotumor cerebri, PTSD and schizoaffective who presents to the emergency department with a transient episode of flushing and chills earlier tonight.  She has been down uptitrating her buprenorphine while decreasing her morphine per doctors orders, but no other medication changes, and has otherwise been her regular state of health.  Denies respiratory complaints, pain, GI or GU complaints.  Feels well now.   External Medical Documents Reviewed: March 2024 emergency department visit for lower back pain documenting her past medical history      Physical Exam   Triage Vital Signs: ED Triage Vitals  Enc Vitals Group     BP 07/21/22 2124 101/62     Pulse Rate 07/21/22 2124 (!) 130     Resp 07/21/22 2124 18     Temp 07/21/22 2124 98.2 F (36.8 C)     Temp Source 07/21/22 2335 Oral     SpO2 07/21/22 2107 96 %     Weight 07/21/22 2122 285 lb (129.3 kg)     Height 07/21/22 2122  (1.6 m)     Head Circumference --      Peak Flow --      Pain Score 07/21/22 2122 10     Pain Loc --      Pain Edu? --      Excl. in GC? --     Most recent vital signs: Vitals:   07/21/22 2124 07/21/22 2335  BP: 101/62 120/77  Pulse: (!) 130 96  Resp: 18 20  Temp: 98.2 F (36.8 C) 98.1 F (36.7 C)  SpO2: 96% 99%    General: Awake, no distress.  CV:  Good peripheral perfusion.  Resp:  Normal effort.  Abd:  No distention.  Other:  Awake alert comfortable.  Lungs clear abdomen soft nontender skin appears warm well-perfused vital signs within normal limit.   ED Results / Procedures / Treatments   Labs (all labs ordered are listed, but only abnormal results are displayed) Labs Reviewed  COMPREHENSIVE  METABOLIC PANEL - Abnormal; Notable for the following components:      Result Value   Potassium 3.2 (*)    Glucose, Bld 140 (*)    AST 64 (*)    All other components within normal limits  URINALYSIS, ROUTINE W REFLEX MICROSCOPIC - Abnormal; Notable for the following components:   Color, Urine AMBER (*)    APPearance HAZY (*)    Ketones, ur 5 (*)    Protein, ur 30 (*)    All other components within normal limits  CBC WITH DIFFERENTIAL/PLATELET  TROPONIN I (HIGH SENSITIVITY)  TROPONIN I (HIGH SENSITIVITY)     I ordered and reviewed the above labs they are notable for mildly potassium at 3.2 other cell counts normal  EKG  ED ECG REPORT I, Pilar Jarvis, the attending physician, personally viewed and interpreted this ECG.   Date: 07/22/2022  EKG Time: 2123  Rate: 140  Rhythm: sinus tachycardia  Axis: nl  Intervals:long qtc  ST&T Change: none    RADIOLOGY I independently reviewed and interpreted chest x-ray and see no obvious focality or pneumothorax   PROCEDURES:  Critical Care performed: No  Procedures   MEDICATIONS ORDERED IN  ED: Medications  clonazePAM (KLONOPIN) tablet 1 mg (has no administration in time range)    IMPRESSION / MDM / ASSESSMENT AND PLAN / ED COURSE  I reviewed the triage vital signs and the nursing notes.                                Patient's presentation is most consistent with acute presentation with potential threat to life or bodily function.  Differential diagnosis includes, but is not limited to, menopausal symptoms, vasovagal syncope, dysrhythmia, electrolyte disturbance, infection and ACS   The patient is on the cardiac monitor to evaluate for evidence of arrhythmia and/or significant heart rate changes.  MDM: Patient with transient flushing and chills that has now completely resolved and no other accompanying symptoms or recent illnesses.  Labs unremarkable and EKG initially showed sinus tachycardia with long QTc and recheck  showed normal QTc at 480 and normal sinus rhythm.  Her troponins have been negative, no other chest pain or shortness of breath to suggest ACS.   I am not certain what has caused her symptoms but I do not think it is a life-threatening emergency right now given her asymptomatic and negative workup as above.  She will follow-up with PMD and return with any new or worsening symptoms.        FINAL CLINICAL IMPRESSION(S) / ED DIAGNOSES   Final diagnoses:  Flushing     Rx / DC Orders   ED Discharge Orders     None        Note:  This document was prepared using Dragon voice recognition software and may include unintentional dictation errors.    Pilar Jarvis, MD 07/22/22 [phone]

## 2022-07-22 NOTE — Discharge Instructions (Signed)
Your blood test and urine test were normal today.  I am not sure what accounted for your flushing feeling and chills earlier but since you have been several hours without a repeat of your symptoms with normal testing today I think it safe to go home and follow-up with your regular doctor.  Continue to closely monitor your symptoms and if you develop any new or worsening symptoms come back to the emergency department

## 2022-08-14 ENCOUNTER — Emergency Department
Admission: EM | Admit: 2022-08-14 | Discharge: 2022-08-14 | Disposition: A | Discharge status: Home / Self Care | Payer: BC Managed Care – PPO | Attending: Emergency Medicine | Admitting: Emergency Medicine

## 2022-08-14 ENCOUNTER — Other Ambulatory Visit: Payer: Self-pay

## 2022-08-14 DIAGNOSIS — G8929 Other chronic pain: Secondary | ICD-10-CM | POA: Diagnosis not present

## 2022-08-14 DIAGNOSIS — M545 Low back pain, unspecified: Secondary | ICD-10-CM | POA: Diagnosis not present

## 2022-08-14 DIAGNOSIS — Z1152 Encounter for screening for COVID-19: Secondary | ICD-10-CM | POA: Insufficient documentation

## 2022-08-14 DIAGNOSIS — J069 Acute upper respiratory infection, unspecified: Secondary | ICD-10-CM | POA: Insufficient documentation

## 2022-08-14 DIAGNOSIS — M546 Pain in thoracic spine: Secondary | ICD-10-CM | POA: Diagnosis not present

## 2022-08-14 DIAGNOSIS — R059 Cough, unspecified: Secondary | ICD-10-CM | POA: Diagnosis present

## 2022-08-14 LAB — RESP PANEL BY RT-PCR (RSV, FLU A&B, COVID)  RVPGX2
Influenza A by PCR: NEGATIVE
Influenza B by PCR: NEGATIVE
Resp Syncytial Virus by PCR: NEGATIVE
SARS Coronavirus 2 by RT PCR: NEGATIVE

## 2022-08-14 MED ORDER — DEXAMETHASONE SODIUM PHOSPHATE 10 MG/ML IJ SOLN
10.0000 mg | Freq: Once | INTRAMUSCULAR | Status: AC
  Administered 2022-08-14: 10 mg via INTRAVENOUS

## 2022-08-14 MED ORDER — DEXAMETHASONE SODIUM PHOSPHATE 10 MG/ML IJ SOLN
10.0000 mg | Freq: Once | INTRAMUSCULAR | Status: DC
  Filled 2022-08-14: qty 1

## 2022-08-14 MED ORDER — PSEUDOEPH-BROMPHEN-DM 30-2-10 MG/5ML PO SYRP: 2.5000 mL | ORAL_SOLUTION | Freq: Four times a day (QID) | ORAL | 0 refills | Status: DC | PRN

## 2022-08-14 MED ORDER — PREDNISONE 10 MG PO TABS: 10.0000 mg | ORAL_TABLET | Freq: Every day | ORAL | 0 refills | Status: DC

## 2022-08-14 MED ORDER — KETOROLAC TROMETHAMINE 10 MG PO TABS: 10.0000 mg | ORAL_TABLET | Freq: Four times a day (QID) | ORAL | 0 refills | Status: DC | PRN

## 2022-08-14 MED ORDER — LIDOCAINE 5 % EX PTCH
1.0000 | MEDICATED_PATCH | CUTANEOUS | Status: DC
  Administered 2022-08-14: 1 via TRANSDERMAL
  Filled 2022-08-14: qty 1

## 2022-08-14 MED ORDER — KETOROLAC TROMETHAMINE 15 MG/ML IJ SOLN
15.0000 mg | Freq: Once | INTRAMUSCULAR | Status: DC
  Filled 2022-08-14: qty 1

## 2022-08-14 MED ORDER — KETOROLAC TROMETHAMINE 15 MG/ML IJ SOLN
15.0000 mg | Freq: Once | INTRAMUSCULAR | Status: AC
  Administered 2022-08-14: 15 mg via INTRAVENOUS

## 2022-08-14 MED ORDER — ACETAMINOPHEN 500 MG PO TABS
500.0000 mg | ORAL_TABLET | Freq: Once | ORAL | Status: AC
  Administered 2022-08-14: 500 mg via ORAL
  Filled 2022-08-14: qty 1

## 2022-08-14 NOTE — ED Notes (Signed)
Pt calling out to first nurse stating that she feels like she is going to pass out. Pt states that her heart is beating very fast and it hurts. Pt taken by Emilee, EMT for EKG and vital signs

## 2022-08-14 NOTE — ED Provider Notes (Signed)
[facility] Emergency Department Provider Note     None    (approximate)   History   Cough, Nasal Congestion, and Palpitations   HPI  Audrey [name] is a 47 y.o. female presents to the emergency department with complaint of cough and chest congestion x 3 days.  Cough is dry.  No sputum.  No known sick contacts.  Associated symptoms include headache.  Endorses generalized bodyaches and pains, she reports this to be chronic.  Denies fever, sore throat, vomiting, shortness of breath and chest pain.  Patient reports she has a pain management appointment on Tuesday, and is requesting medication for pain over the weekend until her appointment.  No other complaints at this time.    Physical Exam   Triage Vital Signs: ED Triage Vitals  Enc Vitals Group     BP 08/14/22 1621 107/87     Pulse Rate 08/14/22 1621 (!) 103     Resp 08/14/22 1621 16     Temp 08/14/22 1621 98.6 F (37 C)     Temp Source 08/14/22 1621 Oral     SpO2 08/14/22 1621 97 %     Weight 08/14/22 1619 300 lb (136.1 kg)     Height 08/14/22 1619 5\' 3"  (1.6 m)     Head Circumference --      Peak Flow --      Pain Score 08/14/22 1619 10     Pain Loc --      Pain Edu? --      Excl. in GC? --     Most recent vital signs: Vitals:   08/14/22 1621 08/14/22 1945  BP: 107/87 112/86  Pulse: (!) 103 91  Resp: 16 16  Temp: 98.6 F (37 C)   SpO2: 97% 98%   General: Alert and oriented. INAD.  Skin:  Warm, dry and intact. No rashes or lesions noted.     Head:  NCAT.  Eyes:  PERRLA. EOMI. Conjunctivae clear. Ears:  EACs patent. TMs translucent.   Nose:   Mucosa is moist. Mild rhinorrhea. Throat: Uvula is midline. No tonsillar swelling or exudates.  Neck:   No cervical spine tenderness to palpation. No lymphadenopathy.  CV:  Good peripheral perfusion. RRR.  RESP:  Normal effort. LCTAB.  ABD:  No distention.  BACK:  Diffusely tender upon palpation along the paraspinal muscles of  thoracic and lower spine region.  MSK:   Patient freely moves all extremities.  Peripheral edema NEURO: Sensation and motor function intact.        ED Results / Procedures / Treatments   Labs (all labs ordered are listed, but only abnormal results are displayed) Labs Reviewed  RESP PANEL BY RT-PCR (RSV, FLU A&B, COVID)  [id number]   RADIOLOGY I personally viewed and evaluated these images as part of my medical decision making, as well as reviewing the written report by the radiologist.  No results found.   PROCEDURES:  Critical Care performed: No  Procedures   MEDICATIONS ORDERED IN ED: Medications  acetaminophen (TYLENOL) tablet 500 mg (500 mg Oral Given 08/14/22 1802)  dexamethasone (DECADRON) injection 10 mg (10 mg Intravenous Given 08/14/22 1848)  ketorolac (TORADOL) 15 MG/ML injection 15 mg (15 mg Intravenous Given 08/14/22 1848)     IMPRESSION / MDM / ASSESSMENT AND PLAN / ED COURSE  I reviewed the triage vital signs and the nursing notes.  Differential diagnosis includes, but is not limited to, bronchitis, COVID, influenza, pneumonia  Patient's presentation is most consistent with acute illness / injury with system symptoms.  47 year old female presents to the emergency department for evaluation and treatment of acute cough and chronic back pain.  The HPI for further details.  Patient is given Decadron, Toradol, and Tylenol for for pain in the ED. on reevaluation pain improved significantly.  Patient is requesting to go home. Patient's diagnosis is consistent with viral URI and chronic back pain. Patient will be discharged home with prescriptions for dose of Toradol and prednisone and antihistamine and decongestant combo for cough. Patient is to follow up with her primary care as needed or otherwise directed. Patient is given ED precautions to return to the ED for any worsening or new symptoms.     FINAL CLINICAL IMPRESSION(S) / ED DIAGNOSES    Final diagnoses:  Upper respiratory tract infection, unspecified type  Chronic back pain, unspecified back location, unspecified back pain laterality     Rx / DC Orders   ED Discharge Orders          Ordered    ketorolac (TORADOL) 10 MG tablet  Every 6 hours PRN        08/14/22 1921    predniSONE (DELTASONE) 10 MG tablet  Daily        08/14/22 1921    brompheniramine-pseudoephedrine-DM 30-2-10 MG/5ML syrup  4 times daily PRN        08/14/22 1921             Note:  This document was prepared using Dragon voice recognition software and may include unintentional dictation errors.    Romeo Apple, Kennan Detter A, PA-C 08/15/22 2113    Sharman Cheek, MD 08/17/22 [phone]

## 2022-08-14 NOTE — ED Triage Notes (Signed)
Pt to ED from home for cough and congestion since 3 days, had some palpitations while in lobby, and chronic generalized pain that is ongoing. Pt in no acute distress, skin dry. EKG performed.

## 2022-10-17 ENCOUNTER — Emergency Department: Payer: 59

## 2022-10-17 ENCOUNTER — Emergency Department
Admission: EM | Admit: 2022-10-17 | Discharge: 2022-10-17 | Disposition: A | Discharge status: Home / Self Care | Payer: 59 | Attending: Emergency Medicine | Admitting: Emergency Medicine

## 2022-10-17 ENCOUNTER — Other Ambulatory Visit: Payer: Self-pay

## 2022-10-17 DIAGNOSIS — G8929 Other chronic pain: Secondary | ICD-10-CM | POA: Insufficient documentation

## 2022-10-17 DIAGNOSIS — G894 Chronic pain syndrome: Secondary | ICD-10-CM

## 2022-10-17 DIAGNOSIS — B372 Candidiasis of skin and nail: Secondary | ICD-10-CM

## 2022-10-17 DIAGNOSIS — R079 Chest pain, unspecified: Secondary | ICD-10-CM | POA: Diagnosis present

## 2022-10-17 DIAGNOSIS — R0789 Other chest pain: Secondary | ICD-10-CM | POA: Insufficient documentation

## 2022-10-17 LAB — CBC WITH DIFFERENTIAL/PLATELET
Abs Immature Granulocytes: 0.02 10*3/uL (ref 0.00–0.07)
Basophils Absolute: 0.1 10*3/uL (ref 0.0–0.1)
Basophils Relative: 1 %
Eosinophils Absolute: 0.2 10*3/uL (ref 0.0–0.5)
Eosinophils Relative: 3 %
HCT: 45.1 % (ref 36.0–46.0)
Hemoglobin: 15 g/dL (ref 12.0–15.0)
Immature Granulocytes: 0 %
Lymphocytes Relative: 29 %
Lymphs Abs: 2.2 10*3/uL (ref 0.7–4.0)
MCH: 29.6 pg (ref 26.0–34.0)
MCHC: 33.3 g/dL (ref 30.0–36.0)
MCV: 89.1 fL (ref 80.0–100.0)
Monocytes Absolute: 0.5 10*3/uL (ref 0.1–1.0)
Monocytes Relative: 6 %
Neutro Abs: 4.6 10*3/uL (ref 1.7–7.7)
Neutrophils Relative %: 61 %
Platelets: 268 10*3/uL (ref 150–400)
RBC: 5.06 MIL/uL (ref 3.87–5.11)
RDW: 14.2 % (ref 11.5–15.5)
WBC: 7.5 10*3/uL (ref 4.0–10.5)
nRBC: 0 % (ref 0.0–0.2)

## 2022-10-17 LAB — LIPASE, BLOOD: Lipase: 27 U/L (ref 11–51)

## 2022-10-17 LAB — COMPREHENSIVE METABOLIC PANEL
ALT: 29 U/L (ref 0–44)
AST: 55 U/L — ABNORMAL HIGH (ref 15–41)
Albumin: 4 g/dL (ref 3.5–5.0)
Alkaline Phosphatase: 72 U/L (ref 38–126)
Anion gap: 9 (ref 5–15)
BUN: 7 mg/dL (ref 6–20)
CO2: 24 mmol/L (ref 22–32)
Calcium: 9.4 mg/dL (ref 8.9–10.3)
Chloride: 108 mmol/L (ref 98–111)
Creatinine, Ser: 0.92 mg/dL (ref 0.44–1.00)
GFR, Estimated: >60 mL/min (ref >60)
Glucose, Bld: 96 mg/dL (ref 70–99)
Potassium: 3.8 mmol/L (ref 3.5–5.1)
Sodium: 141 mmol/L (ref 135–145)
Total Bilirubin: 1.5 mg/dL — ABNORMAL HIGH (ref 0.3–1.2)
Total Protein: 7 g/dL (ref 6.5–8.1)

## 2022-10-17 LAB — TROPONIN I (HIGH SENSITIVITY)
Troponin I (High Sensitivity): 4 ng/L (ref <18)
Troponin I (High Sensitivity): 4 ng/L (ref <18)

## 2022-10-17 MED ORDER — CLOTRIMAZOLE 1 % EX CREA: 1.0000 | TOPICAL_CREAM | Freq: Two times a day (BID) | CUTANEOUS | 0 refills | Status: DC

## 2022-10-17 MED ORDER — ACETAMINOPHEN 500 MG PO TABS
1000.0000 mg | ORAL_TABLET | Freq: Once | ORAL | Status: AC
  Administered 2022-10-17: 1000 mg via ORAL
  Filled 2022-10-17: qty 2

## 2022-10-17 MED ORDER — CLONIDINE HCL 0.1 MG PO TABS
0.2000 mg | ORAL_TABLET | Freq: Once | ORAL | Status: AC
  Administered 2022-10-17: 0.2 mg via ORAL
  Filled 2022-10-17: qty 2

## 2022-10-17 MED ORDER — CLONIDINE HCL 0.1 MG PO TABS: 0.1000 mg | ORAL_TABLET | Freq: Two times a day (BID) | ORAL | 0 refills | Status: DC

## 2022-10-17 MED ORDER — LORAZEPAM 2 MG PO TABS
2.0000 mg | ORAL_TABLET | Freq: Once | ORAL | Status: AC
  Administered 2022-10-17: 2 mg via ORAL
  Filled 2022-10-17: qty 1

## 2022-10-17 MED ORDER — ONDANSETRON HCL 4 MG PO TABS: 4.0000 mg | ORAL_TABLET | Freq: Every day | ORAL | 1 refills | Status: DC | PRN

## 2022-10-17 MED ORDER — IBUPROFEN 600 MG PO TABS: 600.0000 mg | ORAL_TABLET | Freq: Once | ORAL | Status: DC

## 2022-10-17 MED ORDER — IBUPROFEN 800 MG PO TABS
800.0000 mg | ORAL_TABLET | Freq: Once | ORAL | Status: AC
  Administered 2022-10-17: 800 mg via ORAL
  Filled 2022-10-17: qty 1

## 2022-10-17 MED ORDER — ONDANSETRON 4 MG PO TBDP
4.0000 mg | ORAL_TABLET | Freq: Once | ORAL | Status: AC
  Administered 2022-10-17: 4 mg via ORAL
  Filled 2022-10-17: qty 1

## 2022-10-17 NOTE — ED Notes (Signed)
Pt not currently in room

## 2022-10-17 NOTE — ED Triage Notes (Signed)
Pt to er, pt states that she is here for chest pain, states that she feels hot and cold, states that she can't stand up without falling over, pt states that her hands and feet are tingly, states that she has been breathing fast since she woke up.  States that she has a hx of panic attacks, but this feels worse, states that a normal panic attack makes her vomit.

## 2022-10-17 NOTE — ED Provider Notes (Addendum)
[facility] Provider Note    Event Date/Time   First MD Initiated Contact with Patient 10/17/22 1142     (approximate)   History   Chest Pain   HPI  Audrey [name] is a 47 y.o. female   Past medical history of chronic pain, PTSD, schizoaffective, depression, anxiety, who presents to the emergency department with chest pain and whole body pain.  She was recently taken off of her hydromorphone and switched to Suboxone and "it did not work" so she self discontinued about a week ago and feels like she is going through withdrawal with diarrhea, chills, increased in her whole body chronic pain, and development of chest pain today.  No shortness of breath or cough.  No fever.  No other acute GI or GU complaints.  She has not used any narcotic medications for 1 week.  She is going through menopause and has had hot flashes unchanged from prior.  Red rash under the breasts.   Independent Historian contributed to assessment above: Her husband is at bedside to corroborate information given above       Physical Exam   Triage Vital Signs: ED Triage Vitals  Encounter Vitals Group     BP 10/17/22 1057 (!) 142/112     Systolic BP Percentile --      Diastolic BP Percentile --      Pulse Rate 10/17/22 1057 (!) 122     Resp 10/17/22 1057 (!) 24     Temp 10/17/22 1057 97.9 F (36.6 C)     Temp Source 10/17/22 1057 Oral     SpO2 10/17/22 1057 93 %     Weight 10/17/22 1055 260 lb (117.9 kg)     Height 10/17/22 1055 5\' 3"  (1.6 m)     Head Circumference --      Peak Flow --      Pain Score 10/17/22 1054 9     Pain Loc --      Pain Education --      Exclude from Growth Chart --     Most recent vital signs: Vitals:   10/17/22 1351 10/17/22 1352  BP: 111/62   Pulse: 96   Resp: 16   Temp:    SpO2: 93% 96%    General: Awake, no distress.  CV:  Good peripheral perfusion.  Resp:  Normal effort.  Abd:  No distention.  Other:  Initially tachycardic but  resolved on my reexamination, normal vital signs afebrile, lungs clear, soft nontender abdomen moving all extremities with full active range of motion appears euvolemic.   ED Results / Procedures / Treatments   Labs (all labs ordered are listed, but only abnormal results are displayed) Labs Reviewed  COMPREHENSIVE METABOLIC PANEL - Abnormal; Notable for the following components:      Result Value   AST 55 (*)    Total Bilirubin 1.5 (*)    All other components within normal limits  CBC WITH DIFFERENTIAL/PLATELET  LIPASE, BLOOD  TROPONIN I (HIGH SENSITIVITY)  TROPONIN I (HIGH SENSITIVITY)     I ordered and reviewed the above labs they are notable for flat troponin x 2  EKG  ED ECG REPORT I, Pilar Jarvis, the attending physician, personally viewed and interpreted this ECG.   Date: 10/17/2022  EKG Time: 1053  Rate: 132  Rhythm: sinus tachycardia  Axis: nl  Intervals:none  ST&T Change: no stemi    RADIOLOGY I independently reviewed and interpreted chest x-ray and I see  no obvious focality or pneumothorax   PROCEDURES:  Critical Care performed: No  Procedures   MEDICATIONS ORDERED IN ED: Medications  LORazepam (ATIVAN) tablet 2 mg (2 mg Oral Given 10/17/22 1201)  cloNIDine (CATAPRES) tablet 0.2 mg (0.2 mg Oral Given 10/17/22 1357)  ondansetron (ZOFRAN-ODT) disintegrating tablet 4 mg (4 mg Oral Given 10/17/22 1357)  acetaminophen (TYLENOL) tablet 1,000 mg (1,000 mg Oral Given 10/17/22 1357)  ibuprofen (ADVIL) tablet 800 mg (800 mg Oral Given 10/17/22 1413)     IMPRESSION / MDM / ASSESSMENT AND PLAN / ED COURSE  I reviewed the triage vital signs and the nursing notes.                                Patient's presentation is most consistent with acute presentation with potential threat to life or bodily function.  Differential diagnosis includes, but is not limited to, exacerbation of chronic pain, ACS, PE, dissection, respiratory infection pneumothorax   The  patient is on the cardiac monitor to evaluate for evidence of arrhythmia and/or significant heart rate changes.  MDM:   This patient with chronic pain 1 week out from last Suboxone dose going through symptoms of opioid withdrawal as well.  Would be very nonspecific atypical for cardiopulmonary emergencies like ACS, PE, dissection.  Initial tachycardia resolved with some Ativan, nonischemic EKG and serial troponins have been flat.  I doubt PE given no shortness of breath associated doubt dissection given very atypical symptoms for that diagnosis.  She was given some clonidine and Zofran and she states that this did not do anything for her.  She is reluctant to start on Suboxone because it did not do anything for her as well per patient report.  She got new insurance and states that she is unable to see her prior pain clinic.  I offered a multitude of nonnarcotic pain options for management of withdrawal of all of which she declined.  Yeast infection under the breasts, rx fungal cream  Plan will be for discharge at this time given unremarkable workup and pain clinic referral.        FINAL CLINICAL IMPRESSION(S) / ED DIAGNOSES   Final diagnoses:  Chronic pain syndrome  Nonspecific chest pain  Yeast infection of the skin     Rx / DC Orders   ED Discharge Orders          Ordered    Ambulatory referral to Pain Clinic        10/17/22 1435    cloNIDine (CATAPRES) 0.1 MG tablet  2 times daily        10/17/22 1435    ondansetron (ZOFRAN) 4 MG tablet  Daily PRN        10/17/22 1435    clotrimazole (CLOTRIMAZOLE ANTI-FUNGAL) 1 % cream  2 times daily        10/17/22 1507             Note:  This document was prepared using Dragon voice recognition software and may include unintentional dictation errors.    Pilar Jarvis, MD 10/17/22 1457    Pilar Jarvis, MD 10/17/22 [phone]

## 2022-12-15 ENCOUNTER — Ambulatory Visit: Payer: BC Managed Care – PPO | Attending: Psychiatry

## 2022-12-15 DIAGNOSIS — M5459 Other low back pain: Secondary | ICD-10-CM | POA: Insufficient documentation

## 2022-12-15 DIAGNOSIS — R262 Difficulty in walking, not elsewhere classified: Secondary | ICD-10-CM | POA: Diagnosis present

## 2022-12-15 NOTE — Therapy (Signed)
OUTPATIENT PHYSICAL THERAPY THORACOLUMBAR EVALUATION   Patient Name: Audrey [name] MRN: [id number] DOB:05/01/1975, 47 y.o., female Today's Date: 12/15/2022  END OF SESSION:  PT End of Session - 12/15/22 1349     Visit Number 1    Number of Visits 17    Date for PT Re-Evaluation 02/12/23    PT Start Time 1350    PT Stop Time 1429    PT Time Calculation (min) 39 min    Activity Tolerance Patient limited by pain    Behavior During Therapy [facility] for tasks assessed/performed             Past Medical History:  Diagnosis Date   Anxiety    Arthritis    DDD (degenerative disc disease), lumbar 12/10/2020   Depression    Heart murmur    Pseudotumor cerebri    PTSD (post-traumatic stress disorder)    Schizo affective schizophrenia (HCC)    Sciatica    Past Surgical History:  Procedure Laterality Date   ABDOMINAL HYSTERECTOMY     ABDOMINAL SURGERY     APPENDECTOMY     CARDIAC CATHETERIZATION     CHOLECYSTECTOMY     KNEE SURGERY     TONSILLECTOMY     TUBAL LIGATION     Patient Active Problem List   Diagnosis Date Noted   Acute gastroenteritis 12/07/2021   Elevated LFTs 12/06/2021   SIRS (systemic inflammatory response syndrome) (HCC) 12/06/2021   Pneumonia 12/06/2021   Syncopal episodes, suspect vasovagal 12/06/2021   Tachyarrhythmia 12/06/2021   History of cardiac arrest 12/06/2021   Vitamin D deficiency 08/15/2021   B12 deficiency 08/14/2021   MDD (major depressive disorder), recurrent episode, severe (HCC) 08/13/2021   Schizoaffective disorder, depressive type (HCC) 08/09/2021   Cardiac arrest (HCC) 08/02/2021   Goals of care, counseling/discussion    History of pericarditis 06/23/2021   BMI 45.0-49.9, adult (HCC) 06/23/2021   Vision loss 06/22/2021   AKI (acute kidney injury) (HCC) 06/22/2021   Pseudotumor cerebri 06/22/2021   At high risk for self harm 12/10/2020   Bradycardia 12/10/2020   DDD (degenerative disc disease), lumbar 12/10/2020    Hallucinations 12/10/2020   Heart murmur 12/10/2020   Migraine without aura and without status migrainosus, not intractable 12/10/2020   Mild intermittent asthma without complication 12/10/2020   PTSD (post-traumatic stress disorder) 12/10/2020   PUD (peptic ulcer disease) 12/10/2020   Essential hypertension 08/01/2018   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/01/2018   Recurrent major depressive disorder, in partial remission (HCC) 08/01/2018   Asthma 05/03/2017   Insomnia 05/03/2017   Spasm of back muscles 05/03/2017   Acute loss of vision, bilateral 12/04/2015   Dizziness 12/04/2015   Nausea without vomiting 12/04/2015   Neck stiffness 12/04/2015   Chronic pain syndrome 12/25/2014   Acute contact dermatitis 09/11/2014   Bilateral low back pain with sciatica 09/11/2014   Chronic migraine without aura, intractable, without status migrainosus 09/11/2014   Pain in joint, lower leg 09/11/2014   Pneumonia of right upper lobe due to infectious organism 09/11/2014   Benign intracranial hypertension 09/11/2014   Anxiety 07/26/2014    PCP: Nathaneil Canary, PA-C   REFERRING PROVIDER: Linton Ham, MD  REFERRING DIAG: [phone] (ICD-10-CM) - Lumbago with sciatica, left side  Lumbago with sciatica, both sides, fibromyalgia, DDD  Rationale for Evaluation and Treatment: Rehabilitation  THERAPY DIAG:  Other low back pain - Plan: PT plan of care cert/re-cert  Difficulty in walking, not elsewhere classified - Plan: PT plan of care cert/re-cert  ONSET DATE: 2010  SUBJECTIVE:                                                                                                                                                                                           SUBJECTIVE STATEMENT: Low back: 6/10 currently, 10/10 at worst for the past 3 months (pt fell backwards and hit her back at the edge of the bathtub. Has B posterior hip (along L5/S1 dermatome) and B inner thigh pain but not numbness.   PERTINENT  HISTORY:   Low back pain with B sciatica since 2010, sudden onset. Pt has naturally fused T11/T12, and her coccyx, and L1/L2. Pt also has sciatic going from hip to hip and osteoarthritis throughout her body. Also has R knee surgery in high school x 4 (one surgery per high school year) (tightened tendons R lateral and inferior patella). Pt demonstrates B patella alta. Needs B patellar surgery due to arthritis.  Pain has worsened since 2010.     Eyes start throbbing if she walks for too long, doctor knows about it and that's why she has a rollator. Used to be in a wheel chair and it took her about 1.5 years to get out of the wc. Currently using a rollator walker for the past 9 months for grocery shopping.     Has latex allergies Blood pressure is controlled per pt.     PAIN:  Are you having pain? Yes: NPRS scale: 6/10 Pain location: Low back Pain description: pain Aggravating factors: picking up a gallon of milk, bending over, sitting for a few minutes, standing (less than 5 minutes), walking more than 15 steps causes her low back muscle to lock up and feels bone pain Relieving factors: R S/L, with neck and arm pillow, as well as pillow between her knees, and hugging a thick pillow with pt slightly turned towards her stomach but her pillow stops her from rolling more.   PRECAUTIONS: no known precautions  RED FLAGS: Bowel or bladder incontinence: Yes: bladder since May 2023 and Cauda equina syndrome: No   WEIGHT BEARING RESTRICTIONS: No  FALLS:  Has patient fallen in last 6 months? Yes. Number of falls falls every other week per pt. Both knees give out or her back cramps up and pt goes forward causing her falls.   LIVING ENVIRONMENT: Lives with: lives with their spouse Lives in: House/apartment Stairs:  second floor apartment, 18 steps to get to the 2nd floor B rail, husband is always with her for the stairs.  Has following equipment at home: Single point cane, Wheelchair (manual),  rollator, and None. Pt tries not to use  an AD  OCCUPATION: Disabled  PLOF: Mod I with rollator.   PATIENT GOALS: Be able to stand longer and walk further.   NEXT MD VISIT: December 30, 2022  OBJECTIVE:   DIAGNOSTIC FINDINGS:   DG Lumbar Spine 2-3 Views 11/17/2021  CLINICAL DATA:  Fall, left hip pain   EXAM: LUMBAR SPINE - 2-3 VIEW   COMPARISON:  None Available.   FINDINGS: Lateral imaging is limited by underpenetration. There is no evidence of lumbar spine fracture. Alignment is normal. Intervertebral disc spaces are maintained.   IMPRESSION: No acute fracture or subluxation.     Electronically Signed   By: Helyn Numbers M.D.   On: 11/17/2021 19:37   CT Lumbar Spine Wo Contrast (05/23/2022)   CLINICAL DATA:  Mid-back pain, compression fracture suspected; Low back pain, increased fracture risk   EXAM: CT THORACIC AND LUMBAR SPINE WITHOUT CONTRAST   TECHNIQUE: Multidetector CT imaging of the thoracic and lumbar spine was performed without contrast. Multiplanar CT image reconstructions were also generated.   RADIATION DOSE REDUCTION: This exam was performed according to the departmental dose-optimization program which includes automated exposure control, adjustment of the mA and/or kV according to patient size and/or use of iterative reconstruction technique.   COMPARISON:  Correlation made with CT chest September 2022   FINDINGS: CT THORACIC SPINE FINDINGS   Alignment: No significant listhesis. There is focal kyphosis at T11-T12.   Vertebrae: Partial fusion across the T11-T12 disc space and fusion of the facets at this level. No acute fracture.   Paraspinal and other soft tissues: Unremarkable.   Disc levels: No significant osseous encroachment on the spinal canal or neural foramina. Mild left T10-T11 facet hypertrophy.   CT LUMBAR SPINE FINDINGS   Segmentation: 5 lumbar type vertebrae.   Alignment: Preserved.   Vertebrae: Vertebral body  heights are maintained. No acute fracture.   Paraspinal and other soft tissues: Unremarkable.   Disc levels: Intervertebral disc heights are maintained. Small disc bulge and mild facet hypertrophy at L5-S1.   IMPRESSION: No fracture or other acute abnormality.  Mild degenerative changes.     Electronically Signed   By: Guadlupe Spanish M.D.   On: 05/23/2021 15:47      PATIENT SURVEYS:  FOTO Lumbar spine FOTO 36  SCREENING FOR RED FLAGS: Bowel or bladder incontinence: Yes: bladder since May 2023  PCP Romie Jumper PA-C aware per pt)  Cauda equina syndrome: No  COGNITION: Overall cognitive status: Within functional limits for tasks assessed     SENSATION:      POSTURE: forward neck, decreased B hip extension, forward flexed, R lumbar side bend (L lumbar convexity)   Decreased low back pain with decreased L lumbar convexity in standing   PALPATION: TTP/sensitive lumbar spine  LUMBAR ROM:   AROM eval  Flexion Limited with lumbar pain; aberrant movement   Extension Very limited with very increased pain  Right lateral flexion Limited with pain  Left lateral flexion Limited with pain  Right rotation Very limited with pain  Left rotation Very limited with pain   (Blank rows = not tested)  LOWER EXTREMITY ROM:     Active  Right eval Left eval  Hip flexion    Hip extension    Hip abduction    Hip adduction    Hip internal rotation    Hip external rotation    Knee flexion    Knee extension -19 -30  Ankle dorsiflexion    Ankle plantarflexion    Ankle inversion  Ankle eversion     (Blank rows = not tested)  LOWER EXTREMITY MMT:    MMT Right eval Left eval  Hip flexion 4+/5 with lumbar and coccyx pain 4/5 with lumbar and coccyx pain  Hip extension (seated manually resisted) 4 3+  Hip abduction (seated manually resisted) 4 with low back tingling 4- with low back tingling  Hip adduction    Hip internal rotation    Hip external rotation    Knee  flexion 4+ 4  Knee extension 4+ with knee joint pain 4  Ankle dorsiflexion    Ankle plantarflexion    Ankle inversion    Ankle eversion     (Blank rows = not tested)  LUMBAR SPECIAL TESTS:    FUNCTIONAL TESTS:    GAIT: Distance walked: 40 ft Assistive device utilized: None Level of assistance: Complete Independence Comments: Antalgic, decreased stance L LE during stance phase, decreased B knee extension during stance phase. Guarded posture.   TODAY'S TREATMENT:                                                                                                                              DATE: 12/15/2022   Eval only  PATIENT EDUCATION:  Education details: POC Person educated: Patient Education method: Explanation Education comprehension: verbalized understanding  HOME EXERCISE PROGRAM:    ASSESSMENT:  CLINICAL IMPRESSION: Patient is a 47 y.o. female who was seen today for physical therapy evaluation and treatment for Low back and B sciatic pain. She also presents with altered gait pattern and posture, reproduction of symptoms with lumbar AROM, TTP to low back, decreased trunk and B hip strength, limited B knee extension L > R, and difficulty perform tasks which involve lifting, bending over, standing, sitting, and walking due to pain. Pt will benefit from skilled physical therapy services to address the aforementioned deficits.       OBJECTIVE IMPAIRMENTS: Abnormal gait, decreased activity tolerance, decreased mobility, difficulty walking, decreased strength, improper body mechanics, postural dysfunction, and pain.   ACTIVITY LIMITATIONS: carrying, lifting, bending, sitting, standing, squatting, stairs, transfers, bed mobility, bathing, toileting, dressing, hygiene/grooming, locomotion level, and caring for others  PARTICIPATION LIMITATIONS:   PERSONAL FACTORS: Fitness, Past/current experiences, Time since onset of injury/illness/exacerbation, and 3+ comorbidities:  anxiety, lumbar DDD, depression, PTSD, Schizo affective schizophrenia  are also affecting patient's functional outcome.   REHAB POTENTIAL: Fair    CLINICAL DECISION MAKING: Evolving/moderate complexity Pain is worse since onset per pt  EVALUATION COMPLEXITY: Moderate   GOALS: Goals reviewed with patient? Yes  SHORT TERM GOALS: Target date: 01/08/2023   Pt will be independent with her initial HEP to decrease pain, improve strength and function.  Baseline: Pt has not yet started her HEP (12/15/2022) Goal status: INITIAL    LONG TERM GOALS: Target date: 02/12/2023  Pt will have a decrease in low back pain to 5/10 or less at worst to promote ability to lift, bend over, sit, stand, and  walk more comfortably.  Baseline: 10/10 low back pain at worst for the past 3 months (12/15/2022) Goal status: INITIAL  2.  Pt will improve her B hip extension and abduction strength by at least 1/2 MMT grade to promote ability to lift, perform standing tasks as well as ambulate more comfortably.  Baseline:  MMT Right eval Left eval  Hip flexion 4+/5 with lumbar and coccyx pain 4/5 with lumbar and coccyx pain  Hip extension (seated manually resisted) 4 3+  Hip abduction (seated manually resisted) 4 with low back tingling 4- with low back tingling   Goal status: INITIAL  3.  Pt will improve her lumbar spine FOTO score by at least 10 points as a demonstration of improved function.  Baseline: Lumbar Spine FOTO 36 (12/15/2022) Goal status: INITIAL PLAN:  PT FREQUENCY: 2x/week  PT DURATION: 8 weeks  PLANNED INTERVENTIONS: Therapeutic exercises, Therapeutic activity, Neuromuscular re-education, Balance training, Gait training, Patient/Family education, Joint mobilization, Stair training, Aquatic Therapy, Dry Needling, Electrical stimulation, Spinal mobilization, Traction, Ionotophoresis 4mg /ml Dexamethasone, and Manual therapy.  PLAN FOR NEXT SESSION: posture, improve trunk and hip strength,  lumbopelvic stability and control, manual techniques, modalities PRN.    Loralyn Freshwater PT, DPT  12/15/2022, 5:32 PM

## 2022-12-21 ENCOUNTER — Ambulatory Visit: Payer: BC Managed Care – PPO

## 2022-12-23 ENCOUNTER — Ambulatory Visit: Payer: BC Managed Care – PPO

## 2022-12-29 ENCOUNTER — Ambulatory Visit: Payer: BC Managed Care – PPO | Attending: Psychiatry

## 2022-12-29 DIAGNOSIS — R262 Difficulty in walking, not elsewhere classified: Secondary | ICD-10-CM | POA: Insufficient documentation

## 2022-12-29 DIAGNOSIS — M5459 Other low back pain: Secondary | ICD-10-CM | POA: Diagnosis present

## 2022-12-29 NOTE — Therapy (Signed)
OUTPATIENT PHYSICAL THERAPY Treatment    Patient Name: TUNISHA MINICOZZI MRN: [id number] DOB:1975-05-24, 47 y.o., female Today's Date: 12/29/2022  END OF SESSION:  PT End of Session - 12/29/22 1338     Visit Number 2    Number of Visits 17    Date for PT Re-Evaluation 02/12/23    PT Start Time 1341    PT Stop Time 1424    PT Time Calculation (min) 43 min    Activity Tolerance Patient limited by pain    Behavior During Therapy [facility] for tasks assessed/performed              Past Medical History:  Diagnosis Date   Anxiety    Arthritis    DDD (degenerative disc disease), lumbar 12/10/2020   Depression    Heart murmur    Pseudotumor cerebri    PTSD (post-traumatic stress disorder)    Schizo affective schizophrenia (HCC)    Sciatica    Past Surgical History:  Procedure Laterality Date   ABDOMINAL HYSTERECTOMY     ABDOMINAL SURGERY     APPENDECTOMY     CARDIAC CATHETERIZATION     CHOLECYSTECTOMY     KNEE SURGERY     TONSILLECTOMY     TUBAL LIGATION     Patient Active Problem List   Diagnosis Date Noted   Acute gastroenteritis 12/07/2021   Elevated LFTs 12/06/2021   SIRS (systemic inflammatory response syndrome) (HCC) 12/06/2021   Pneumonia 12/06/2021   Syncopal episodes, suspect vasovagal 12/06/2021   Tachyarrhythmia 12/06/2021   History of cardiac arrest 12/06/2021   Vitamin D deficiency 08/15/2021   B12 deficiency 08/14/2021   MDD (major depressive disorder), recurrent episode, severe (HCC) 08/13/2021   Schizoaffective disorder, depressive type (HCC) 08/09/2021   Cardiac arrest (HCC) 08/02/2021   Goals of care, counseling/discussion    History of pericarditis 06/23/2021   BMI 45.0-49.9, adult (HCC) 06/23/2021   Vision loss 06/22/2021   AKI (acute kidney injury) (HCC) 06/22/2021   Pseudotumor cerebri 06/22/2021   At high risk for self harm 12/10/2020   Bradycardia 12/10/2020   DDD (degenerative disc disease), lumbar 12/10/2020   Hallucinations  12/10/2020   Heart murmur 12/10/2020   Migraine without aura and without status migrainosus, not intractable 12/10/2020   Mild intermittent asthma without complication 12/10/2020   PTSD (post-traumatic stress disorder) 12/10/2020   PUD (peptic ulcer disease) 12/10/2020   Essential hypertension 08/01/2018   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/01/2018   Recurrent major depressive disorder, in partial remission (HCC) 08/01/2018   Asthma 05/03/2017   Insomnia 05/03/2017   Spasm of back muscles 05/03/2017   Acute loss of vision, bilateral 12/04/2015   Dizziness 12/04/2015   Nausea without vomiting 12/04/2015   Neck stiffness 12/04/2015   Chronic pain syndrome 12/25/2014   Acute contact dermatitis 09/11/2014   Bilateral low back pain with sciatica 09/11/2014   Chronic migraine without aura, intractable, without status migrainosus 09/11/2014   Pain in joint, lower leg 09/11/2014   Pneumonia of right upper lobe due to infectious organism 09/11/2014   Benign intracranial hypertension 09/11/2014   Anxiety 07/26/2014    PCP: Nathaneil Canary, PA-C   REFERRING PROVIDER: Linton Ham, MD  REFERRING DIAG: [phone] (ICD-10-CM) - Lumbago with sciatica, left side  Lumbago with sciatica, both sides, fibromyalgia, DDD  Rationale for Evaluation and Treatment: Rehabilitation  THERAPY DIAG:  Other low back pain  Difficulty in walking, not elsewhere classified  ONSET DATE: 2010  SUBJECTIVE:  SUBJECTIVE STATEMENT: Trying to walk straighter, more upright. 4/10 low back pain currently. Also did chores such as laundry and dishes and did her seated stepper exercise for 15 minutes      PERTINENT HISTORY:   Low back pain with B sciatica since 2010, sudden onset. Pt has naturally fused T11/T12, and her coccyx, and L1/L2. Pt  also has sciatic going from hip to hip and osteoarthritis throughout her body. Also has R knee surgery in high school x 4 (one surgery per high school year) (tightened tendons R lateral and inferior patella). Pt demonstrates B patella alta. Needs B patellar surgery due to arthritis.  Pain has worsened since 2010.     Eyes start throbbing if she walks for too long, doctor knows about it and that's why she has a rollator. Used to be in a wheel chair and it took her about 1.5 years to get out of the wc. Currently using a rollator walker for the past 9 months for grocery shopping.     Has latex allergies Blood pressure is controlled per pt.     PAIN:  Are you having pain? Yes: NPRS scale: 6/10 Pain location: Low back Pain description: pain Aggravating factors: picking up a gallon of milk, bending over, sitting for a few minutes, standing (less than 5 minutes), walking more than 15 steps causes her low back muscle to lock up and feels bone pain Relieving factors: R S/L, with neck and arm pillow, as well as pillow between her knees, and hugging a thick pillow with pt slightly turned towards her stomach but her pillow stops her from rolling more.   PRECAUTIONS: no known precautions  RED FLAGS: Bowel or bladder incontinence: Yes: bladder since May 2023 and Cauda equina syndrome: No   WEIGHT BEARING RESTRICTIONS: No  FALLS:  Has patient fallen in last 6 months? Yes. Number of falls falls every other week per pt. Both knees give out or her back cramps up and pt goes forward causing her falls.   LIVING ENVIRONMENT: Lives with: lives with their spouse Lives in: House/apartment Stairs:  second floor apartment, 18 steps to get to the 2nd floor B rail, husband is always with her for the stairs.  Has following equipment at home: Single point cane, Wheelchair (manual), rollator, and None. Pt tries not to use an AD  OCCUPATION: Disabled  PLOF: Mod I with rollator.   PATIENT GOALS: Be able to  stand longer and walk further.   NEXT MD VISIT: December 30, 2022  OBJECTIVE:   DIAGNOSTIC FINDINGS:   DG Lumbar Spine 2-3 Views 11/17/2021  CLINICAL DATA:  Fall, left hip pain   EXAM: LUMBAR SPINE - 2-3 VIEW   COMPARISON:  None Available.   FINDINGS: Lateral imaging is limited by underpenetration. There is no evidence of lumbar spine fracture. Alignment is normal. Intervertebral disc spaces are maintained.   IMPRESSION: No acute fracture or subluxation.     Electronically Signed   By: Helyn Numbers M.D.   On: 11/17/2021 19:37   CT Lumbar Spine Wo Contrast (05/23/2022)   CLINICAL DATA:  Mid-back pain, compression fracture suspected; Low back pain, increased fracture risk   EXAM: CT THORACIC AND LUMBAR SPINE WITHOUT CONTRAST   TECHNIQUE: Multidetector CT imaging of the thoracic and lumbar spine was performed without contrast. Multiplanar CT image reconstructions were also generated.   RADIATION DOSE REDUCTION: This exam was performed according to the departmental dose-optimization program which includes automated exposure control, adjustment of the mA and/or  kV according to patient size and/or use of iterative reconstruction technique.   COMPARISON:  Correlation made with CT chest September 2022   FINDINGS: CT THORACIC SPINE FINDINGS   Alignment: No significant listhesis. There is focal kyphosis at T11-T12.   Vertebrae: Partial fusion across the T11-T12 disc space and fusion of the facets at this level. No acute fracture.   Paraspinal and other soft tissues: Unremarkable.   Disc levels: No significant osseous encroachment on the spinal canal or neural foramina. Mild left T10-T11 facet hypertrophy.   CT LUMBAR SPINE FINDINGS   Segmentation: 5 lumbar type vertebrae.   Alignment: Preserved.   Vertebrae: Vertebral body heights are maintained. No acute fracture.   Paraspinal and other soft tissues: Unremarkable.   Disc levels: Intervertebral disc  heights are maintained. Small disc bulge and mild facet hypertrophy at L5-S1.   IMPRESSION: No fracture or other acute abnormality.  Mild degenerative changes.     Electronically Signed   By: Guadlupe Spanish M.D.   On: 05/23/2021 15:47      PATIENT SURVEYS:  FOTO Lumbar spine FOTO 36  SCREENING FOR RED FLAGS: Bowel or bladder incontinence: Yes: bladder since May 2023  PCP Romie Jumper PA-C aware per pt)  Cauda equina syndrome: No  COGNITION: Overall cognitive status: Within functional limits for tasks assessed     SENSATION:      POSTURE: forward neck, decreased B hip extension, forward flexed, R lumbar side bend (L lumbar convexity)   Decreased low back pain with decreased L lumbar convexity in standing   PALPATION: TTP/sensitive lumbar spine  LUMBAR ROM:   AROM eval  Flexion Limited with lumbar pain; aberrant movement   Extension Very limited with very increased pain  Right lateral flexion Limited with pain  Left lateral flexion Limited with pain  Right rotation Very limited with pain  Left rotation Very limited with pain   (Blank rows = not tested)  LOWER EXTREMITY ROM:     Active  Right eval Left eval  Hip flexion    Hip extension    Hip abduction    Hip adduction    Hip internal rotation    Hip external rotation    Knee flexion    Knee extension -19 -30  Ankle dorsiflexion    Ankle plantarflexion    Ankle inversion    Ankle eversion     (Blank rows = not tested)  LOWER EXTREMITY MMT:    MMT Right eval Left eval  Hip flexion 4+/5 with lumbar and coccyx pain 4/5 with lumbar and coccyx pain  Hip extension (seated manually resisted) 4 3+  Hip abduction (seated manually resisted) 4 with low back tingling 4- with low back tingling  Hip adduction    Hip internal rotation    Hip external rotation    Knee flexion 4+ 4  Knee extension 4+ with knee joint pain 4  Ankle dorsiflexion    Ankle plantarflexion    Ankle inversion    Ankle  eversion     (Blank rows = not tested)  LUMBAR SPECIAL TESTS:    FUNCTIONAL TESTS:    GAIT: Distance walked: 40 ft Assistive device utilized: None Level of assistance: Complete Independence Comments: Antalgic, decreased stance L LE during stance phase, decreased B knee extension during stance phase. Guarded posture.   TODAY'S TREATMENT:  DATE: 12/29/2022   Therapeutic exercise  Reclined   SKTC  R 10x5 seconds   L 10x5 seconds     piriformis stretch  R 30 seconds x 3  L 30 seconds x 3    figure four stretch   L 30 seconds x 3   R 30 seconds x 3    transversus abdominis contraction 10x5 seconds for 3 sets   Hip adduction isometrics 10x with 5 second holds   Tailbone discomfort.    March with transversus abdominis muscle activation, 10x2 each LE alternating     Improved exercise technique, movement at target joints, use of target muscles after mod verbal, visual, tactile cues.     PATIENT EDUCATION:  Education details: There-ex, HEP Person educated: Patient Education method: Explanation, Demonstration, Tactile cues, Verbal cues, and Handouts Education comprehension: verbalized understanding and returned demonstration  HOME EXERCISE PROGRAM: Access Code: [email or web address] URL: [email or web address] Date: 12/29/2022 Prepared by: Loralyn Freshwater  Exercises - Hooklying Single Knee to Chest Stretch with Towel  - 1 x daily - 7 x weekly - 3 sets - 10 reps - 5 seconds hold - Supine Hip External Rotation Stretch  - 3 x daily - 7 x weekly - 1 sets - 3 reps - 30 seconds hold - Supine Piriformis Stretch with Foot on Ground  - 3 x daily - 7 x weekly - 1 sets - 3 reps - 30 seconds hold - Supine Transversus Abdominis Bracing - Hands on Stomach  - 5 x daily - 7 x weekly - 3 sets - 10 reps - 5 seconds hold    ASSESSMENT:  CLINICAL  IMPRESSION:  Worked on posterior hip stretches to decrease stress to her coccyx. Worked on transversus strengthening to decrease stress to her low back. Increased coccyx pain with hip strengthening exercises. No pain with stretches and transversus abdominis strengthening. Fair tolerance with PT, 5/10 back pain after session. Pt will benefit from continued skilled physical therapy services to decrease pain, improve strength and function.       OBJECTIVE IMPAIRMENTS: Abnormal gait, decreased activity tolerance, decreased mobility, difficulty walking, decreased strength, improper body mechanics, postural dysfunction, and pain.   ACTIVITY LIMITATIONS: carrying, lifting, bending, sitting, standing, squatting, stairs, transfers, bed mobility, bathing, toileting, dressing, hygiene/grooming, locomotion level, and caring for others  PARTICIPATION LIMITATIONS:   PERSONAL FACTORS: Fitness, Past/current experiences, Time since onset of injury/illness/exacerbation, and 3+ comorbidities: anxiety, lumbar DDD, depression, PTSD, Schizo affective schizophrenia  are also affecting patient's functional outcome.   REHAB POTENTIAL: Fair    CLINICAL DECISION MAKING: Evolving/moderate complexity Pain is worse since onset per pt  EVALUATION COMPLEXITY: Moderate   GOALS: Goals reviewed with patient? Yes  SHORT TERM GOALS: Target date: 01/08/2023   Pt will be independent with her initial HEP to decrease pain, improve strength and function.  Baseline: Pt has not yet started her HEP (12/15/2022) Goal status: INITIAL    LONG TERM GOALS: Target date: 02/12/2023  Pt will have a decrease in low back pain to 5/10 or less at worst to promote ability to lift, bend over, sit, stand, and walk more comfortably.  Baseline: 10/10 low back pain at worst for the past 3 months (12/15/2022) Goal status: INITIAL  2.  Pt will improve her B hip extension and abduction strength by at least 1/2 MMT grade to promote ability to  lift, perform standing tasks as well as ambulate more comfortably.  Baseline:  MMT Right eval Left eval  Hip flexion 4+/5 with lumbar and  coccyx pain 4/5 with lumbar and coccyx pain  Hip extension (seated manually resisted) 4 3+  Hip abduction (seated manually resisted) 4 with low back tingling 4- with low back tingling   Goal status: INITIAL  3.  Pt will improve her lumbar spine FOTO score by at least 10 points as a demonstration of improved function.  Baseline: Lumbar Spine FOTO 36 (12/15/2022) Goal status: INITIAL PLAN:  PT FREQUENCY: 2x/week  PT DURATION: 8 weeks  PLANNED INTERVENTIONS: Therapeutic exercises, Therapeutic activity, Neuromuscular re-education, Balance training, Gait training, Patient/Family education, Joint mobilization, Stair training, Aquatic Therapy, Dry Needling, Electrical stimulation, Spinal mobilization, Traction, Ionotophoresis 4mg /ml Dexamethasone, and Manual therapy.  PLAN FOR NEXT SESSION: posture, improve trunk and hip strength, lumbopelvic stability and control, manual techniques, modalities PRN.    Loralyn Freshwater PT, DPT  12/29/2022, 4:20 PM

## 2022-12-31 ENCOUNTER — Ambulatory Visit: Payer: BC Managed Care – PPO

## 2023-01-01 ENCOUNTER — Other Ambulatory Visit: Payer: Self-pay

## 2023-01-05 ENCOUNTER — Ambulatory Visit: Payer: BC Managed Care – PPO

## 2023-01-06 ENCOUNTER — Ambulatory Visit (INDEPENDENT_AMBULATORY_CARE_PROVIDER_SITE_OTHER): Payer: 59 | Admitting: Physician Assistant

## 2023-01-06 ENCOUNTER — Encounter: Payer: Self-pay | Admitting: Physician Assistant

## 2023-01-06 VITALS — BP 130/90 | HR 160 | Temp 98.4°F | Ht 62.0 in | Wt 261.8 lb

## 2023-01-06 DIAGNOSIS — Z1211 Encounter for screening for malignant neoplasm of colon: Secondary | ICD-10-CM

## 2023-01-06 DIAGNOSIS — R110 Nausea: Secondary | ICD-10-CM | POA: Diagnosis not present

## 2023-01-06 DIAGNOSIS — T402X5A Adverse effect of other opioids, initial encounter: Secondary | ICD-10-CM | POA: Diagnosis not present

## 2023-01-06 DIAGNOSIS — K5904 Chronic idiopathic constipation: Secondary | ICD-10-CM

## 2023-01-06 DIAGNOSIS — Z8711 Personal history of peptic ulcer disease: Secondary | ICD-10-CM | POA: Diagnosis not present

## 2023-01-06 DIAGNOSIS — K5903 Drug induced constipation: Secondary | ICD-10-CM | POA: Diagnosis not present

## 2023-01-06 MED ORDER — PEG 3350-KCL-NA BICARB-NACL 420 G PO SOLR: 4000.0000 mL | Freq: Once | ORAL | 0 refills | Status: AC

## 2023-01-06 NOTE — Progress Notes (Signed)
Celso Amy, PA-C [address]  [address]  [place], [place] [address]  Main: [phone]  Fax: [phone]   Gastroenterology Consultation  Referring Provider:     Nathaneil Canary, PA-C Primary Care Physician:  Nathaneil Canary, PA-C Primary Gastroenterologist:  Celso Amy, PA-C / Dr. [name]   Reason for Consultation:     Chronic nausea, chronic chest pain        HPI:   Audrey [name] is a 47 y.o. y/o female referred for consultation & management  by Nathaneil Canary, PA-C.    Patient states she is due for a 10-year repeat screening colonoscopy.  Patient states her last colonoscopy done in 2012 in [place] was normal.  She had previous history of anal sphincter surgery for anal stricture in 2011.  She reports having an EGD done in [place] in 2018 and was diagnosed with gastric ulcers.  Had a GI bleed.  She moved to [place] from [place] 1 year ago.  Is here to establish new GI care.  Current symptoms: She has chronic nausea but no vomiting.  She has chronic constipation.  No current treatment.  She denies heartburn or acid reflux.  She has had a lot of chest pain and chronic generalized body pain which has been attributed to anxiety.  Not currently on a PPI or H2 RB.  Previous negative cardiac workup.  Echocardiogram 11/2021 LVEF 55 to 60%.  Past medical history of chronic pain, chronic opioid use, PTSD, schizoaffective, depression, and anxiety.  Labs 10/17/2022 showed normal CBC with hemoglobin 15.0.  Normal CMP except slightly elevated AST 55, total bilirubin 1.5.  Normal lipase.  -Normal chest x-ray 09/2022 -Previous cholecystectomy, appendectomy, and hysterectomy.    Past Medical History:  Diagnosis Date   Anxiety    Arthritis    DDD (degenerative disc disease), lumbar 12/10/2020   Depression    Heart murmur    Pseudotumor cerebri    PTSD (post-traumatic stress disorder)    Schizo affective schizophrenia (HCC)    Sciatica     Past Surgical  History:  Procedure Laterality Date   ABDOMINAL HYSTERECTOMY     ABDOMINAL SURGERY     APPENDECTOMY     CARDIAC CATHETERIZATION     CHOLECYSTECTOMY     KNEE SURGERY     TONSILLECTOMY     TUBAL LIGATION      Prior to Admission medications   Medication Sig Start Date End Date Taking? Authorizing Provider  albuterol (VENTOLIN HFA) 108 (90 Base) MCG/ACT inhaler Inhale 2 puffs into the lungs every 6 (six) hours as needed for wheezing or shortness of breath. 04/10/22   Minna Antis, MD  brompheniramine-pseudoephedrine-DM 30-2-10 MG/5ML syrup Take 2.5 mLs by mouth 4 (four) times daily as needed. 08/14/22   Romeo Apple, Myah A, PA-C  clonazePAM (KLONOPIN) 1 MG tablet Take 1 mg by mouth 2 (two) times daily.    [provider]  cloNIDine (CATAPRES) 0.1 MG tablet Take 0.1 mg by mouth 2 (two) times daily. 10/17/22   [provider]  clotrimazole (CLOTRIMAZOLE ANTI-FUNGAL) 1 % cream Apply 1 Application topically 2 (two) times daily. 10/17/22   Pilar Jarvis, MD  docusate sodium (COLACE) 100 MG capsule Take 1 capsule (100 mg total) by mouth 2 (two) times daily as needed for mild constipation. 08/16/21   Karsten Ro, MD  DULoxetine (CYMBALTA) 60 MG capsule Take 1 capsule (60 mg total) by mouth daily. 08/16/21   Karsten Ro, MD  gabapentin (NEURONTIN)  800 MG tablet Take 800 mg by mouth 3 (three) times daily. 11/04/21   [provider]  HYDROmorphone (DILAUDID) 2 MG tablet Take 2 mg by mouth 2 (two) times daily as needed for severe pain.    [provider]  ketorolac (TORADOL) 10 MG tablet Take 1 tablet (10 mg total) by mouth every 6 (six) hours as needed. 08/14/22   Romeo Apple, Myah A, PA-C  losartan (COZAAR) 50 MG tablet Take 50 mg by mouth daily. 10/01/21   [provider]  metoprolol succinate (TOPROL-XL) 25 MG 24 hr tablet Take 0.5 tablets (12.5 mg total) by mouth daily. Patient not taking: Reported on 12/06/2021 08/14/21   Azucena Fallen, MD  mirtazapine  (REMERON SOL-TAB) 15 MG disintegrating tablet Take 15 mg by mouth at bedtime. 11/04/21   [provider]  naloxone [facility]) nasal spray 4 mg/0.1 mL Place 1 spray into the nose once as needed (for accidental overdose). 02/26/21   [provider]  ondansetron (ZOFRAN) 4 MG tablet Take 1 tablet (4 mg total) by mouth daily as needed for nausea or vomiting. 10/17/22 10/17/23  Pilar Jarvis, MD  predniSONE (DELTASONE) 10 MG tablet Take 1 tablet (10 mg total) by mouth daily. Day 1-3: take 4 tablets PO daily Day 4-6: take 3 tablets PO daily Day 7-9: take 2 tablets PO daily Day 10-12: take 1 tablet PO daily 04/10/22   Minna Antis, MD  predniSONE (DELTASONE) 10 MG tablet Take 1 tablet (10 mg total) by mouth daily. 08/14/22   Romeo Apple, Myah A, PA-C  QUEtiapine (SEROQUEL) 100 MG tablet Take 100 mg by mouth 3 (three) times daily. 11/04/21   [provider]  tiZANidine (ZANAFLEX) 2 MG tablet Take 1 tablet (2 mg total) by mouth 2 (two) times daily. 08/13/21   Azucena Fallen, MD    Family History  Problem Relation Age of Onset   Bipolar disorder Mother    Schizophrenia Sister    Schizophrenia Sister    Anxiety disorder Other    Depression Other    Suicidality Neg Hx      Social History   Tobacco Use   Smoking status: Never   Smokeless tobacco: Never  Vaping Use   Vaping status: Never Used  Substance Use Topics   Alcohol use: Never   Drug use: Never    Allergies as of 01/06/2023 - Review Complete 01/06/2023  Allergen Reaction Noted   Penicillins Anaphylaxis, Rash, and Hives 11/29/2020   Prochlorperazine Anaphylaxis and Nausea And Vomiting 11/29/2020   Propoxyphene Nausea And Vomiting and Other (See Comments) 07/31/2014   Acetazolamide Other (See Comments) 07/31/2014   Latex Hives and Swelling 09/11/2014   Nitroglycerin er Swelling and Other (See Comments) 11/29/2020    Review of Systems:    All systems reviewed and negative except where noted in HPI.   Physical  Exam:  BP (!) 130/90   Pulse (!) 160   Temp 98.4 F (36.9 C)   Ht 5\' 2"  (1.575 m)   Wt 261 lb 12.8 oz (118.8 kg)   BMI 47.88 kg/m  No LMP recorded. Patient has had a hysterectomy.  General:   Alert,  Well-developed, well-nourished, obese, pleasant and cooperative in NAD Lungs:  Respirations even and unlabored.  Clear throughout to auscultation.   No wheezes, crackles, or rhonchi. No acute distress. Heart:  Regular rate and rhythm; no murmurs, clicks, rubs, or gallops. Abdomen:  Normal bowel sounds.  No bruits.  Soft, and non-distended without masses, hepatosplenomegaly or hernias  noted.  Mild generalized tenderness throughout upper and lower abdomen bilaterally.  No guarding or rebound tenderness.    Neurologic:  Alert and oriented x3;  grossly normal neurologically. Psych:  Alert and cooperative.  Anxious mood and affect.  Imaging Studies: No results found.  Assessment and Plan:   Audrey [name] is a 47 y.o. y/o female has been referred for chronic nausea and chronic abdominal pain.  She has chronic generalized pain and chronic opioid use.  Patient reports last EGD in 2018 in [place] showed gastric ulcers.  She has had previous cholecystectomy, appendectomy and hysterectomy.  Last colonoscopy in 2012 reportedly negative.  She is due for a 10-year repeat screening colonoscopy due to age.  1.  Chronic nausea  H. Pylori Breath Test Today.  Scheduling EGD I discussed risks of EGD with patient to include risk of bleeding, perforation, and risk of sedation.  Patient expressed understanding and agrees to proceed with EGD.   2.  Chronic Constipation (Opiod Induced)  Start Miralax 1-2 capfuls daily   3.  History of Gastric Ulcer (2018 - EGD in [place]; report unavailable).  Repeat EGD due to Nausea.  Not currently on PPI.  Denies NSAID use.  4.  Colon cancer screening  Scheduling Colonoscopy I discussed risks of colonoscopy with patient to include risk of bleeding, colon  perforation, and risk of sedation.  Patient expressed understanding and agrees to proceed with colonoscopy.   2 day Prep: Clenpiq day 1, GoLytely Day 2.  Follow up in 3 months with TG.  Celso Amy, PA-C

## 2023-01-07 LAB — H. PYLORI BREATH TEST: H pylori Breath Test: NEGATIVE

## 2023-01-07 NOTE — Progress Notes (Signed)
Call and notify patient H. pylori breath test is negative.  This is good news.  Continue with current plan for EGD and colonoscopy as scheduled.

## 2023-01-08 ENCOUNTER — Telehealth: Payer: Self-pay

## 2023-01-08 NOTE — Telephone Encounter (Signed)
-----   Message from Celso Amy sent at 01/07/2023  4:43 PM EDT ----- Call and notify patient Audrey [name] breath test is negative.  This is good news.  Continue with current plan for EGD and colonoscopy as scheduled.

## 2023-01-08 NOTE — Telephone Encounter (Signed)
Called patient but unable to leave a message because voicemail is not set up

## 2023-01-12 ENCOUNTER — Ambulatory Visit: Payer: BC Managed Care – PPO

## 2023-01-12 ENCOUNTER — Telehealth: Payer: Self-pay

## 2023-01-12 NOTE — Telephone Encounter (Signed)
Unable to leave message-mailbox not set up- ----- Message from Celso Amy sent at 01/07/2023  4:43 PM EDT ----- Call and notify patient H. pylori breath test is negative.  This is good news.  Continue with current plan for EGD and colonoscopy as scheduled.

## 2023-01-12 NOTE — Telephone Encounter (Signed)
No show. Called pt but unable to leave a message secondary to voice mailbox not set up yet.

## 2023-01-13 NOTE — Telephone Encounter (Signed)
okay

## 2023-01-19 ENCOUNTER — Ambulatory Visit: Payer: BC Managed Care – PPO

## 2023-01-19 ENCOUNTER — Telehealth: Payer: Self-pay

## 2023-01-19 NOTE — Telephone Encounter (Signed)
No show. Called pt but unable to leave a message secondary to voice mailbox not set up yet.

## 2023-01-31 ENCOUNTER — Emergency Department: Payer: 59

## 2023-01-31 ENCOUNTER — Emergency Department
Admission: EM | Admit: 2023-01-31 | Discharge: 2023-01-31 | Disposition: A | Discharge status: Home / Self Care | Payer: 59 | Attending: Emergency Medicine | Admitting: Emergency Medicine

## 2023-01-31 ENCOUNTER — Other Ambulatory Visit: Payer: Self-pay

## 2023-01-31 DIAGNOSIS — R519 Headache, unspecified: Secondary | ICD-10-CM | POA: Diagnosis not present

## 2023-01-31 DIAGNOSIS — Y92000 Kitchen of unspecified non-institutional (private) residence as  the place of occurrence of the external cause: Secondary | ICD-10-CM | POA: Diagnosis not present

## 2023-01-31 DIAGNOSIS — W19XXXA Unspecified fall, initial encounter: Secondary | ICD-10-CM | POA: Diagnosis not present

## 2023-01-31 DIAGNOSIS — M25531 Pain in right wrist: Secondary | ICD-10-CM | POA: Diagnosis not present

## 2023-01-31 DIAGNOSIS — S39012A Strain of muscle, fascia and tendon of lower back, initial encounter: Secondary | ICD-10-CM | POA: Diagnosis not present

## 2023-01-31 DIAGNOSIS — R1013 Epigastric pain: Secondary | ICD-10-CM | POA: Diagnosis not present

## 2023-01-31 DIAGNOSIS — S3992XA Unspecified injury of lower back, initial encounter: Secondary | ICD-10-CM | POA: Diagnosis present

## 2023-01-31 LAB — BASIC METABOLIC PANEL
Anion gap: 10 (ref 5–15)
BUN: 9 mg/dL (ref 6–20)
CO2: 23 mmol/L (ref 22–32)
Calcium: 9.5 mg/dL (ref 8.9–10.3)
Chloride: 107 mmol/L (ref 98–111)
Creatinine, Ser: 0.83 mg/dL (ref 0.44–1.00)
GFR, Estimated: >60 mL/min (ref >60)
Glucose, Bld: 100 mg/dL — ABNORMAL HIGH (ref 70–99)
Potassium: 3.6 mmol/L (ref 3.5–5.1)
Sodium: 140 mmol/L (ref 135–145)

## 2023-01-31 LAB — URINALYSIS, ROUTINE W REFLEX MICROSCOPIC
Bacteria, UA: NONE SEEN
Bilirubin Urine: NEGATIVE
Glucose, UA: NEGATIVE mg/dL
Hgb urine dipstick: NEGATIVE
Ketones, ur: 5 mg/dL — AB
Nitrite: NEGATIVE
Protein, ur: NEGATIVE mg/dL
Specific Gravity, Urine: 1.026 (ref 1.005–1.030)
pH: 5 (ref 5.0–8.0)

## 2023-01-31 LAB — CBC
HCT: 45.5 % (ref 36.0–46.0)
Hemoglobin: 14.9 g/dL (ref 12.0–15.0)
MCH: 30.7 pg (ref 26.0–34.0)
MCHC: 32.7 g/dL (ref 30.0–36.0)
MCV: 93.8 fL (ref 80.0–100.0)
Platelets: 268 10*3/uL (ref 150–400)
RBC: 4.85 MIL/uL (ref 3.87–5.11)
RDW: 13.5 % (ref 11.5–15.5)
WBC: 7.8 10*3/uL (ref 4.0–10.5)
nRBC: 0 % (ref 0.0–0.2)

## 2023-01-31 MED ORDER — KETOROLAC TROMETHAMINE 15 MG/ML IJ SOLN
15.0000 mg | Freq: Once | INTRAMUSCULAR | Status: AC
  Administered 2023-01-31: 15 mg via INTRAVENOUS
  Filled 2023-01-31: qty 1

## 2023-01-31 MED ORDER — OXYCODONE-ACETAMINOPHEN 5-325 MG PO TABS: 1.0000 | ORAL_TABLET | ORAL | 0 refills | Status: AC | PRN

## 2023-01-31 MED ORDER — HYDROMORPHONE HCL 1 MG/ML IJ SOLN
1.0000 mg | Freq: Once | INTRAMUSCULAR | Status: AC
  Administered 2023-01-31: 1 mg via INTRAVENOUS
  Filled 2023-01-31: qty 1

## 2023-01-31 MED ORDER — IOHEXOL 350 MG/ML SOLN
100.0000 mL | Freq: Once | INTRAVENOUS | Status: AC | PRN
  Administered 2023-01-31: 100 mL via INTRAVENOUS

## 2023-01-31 MED ORDER — DIAZEPAM 5 MG PO TABS
5.0000 mg | ORAL_TABLET | Freq: Once | ORAL | Status: AC
  Administered 2023-01-31: 5 mg via ORAL
  Filled 2023-01-31: qty 1

## 2023-01-31 MED ORDER — OXYCODONE-ACETAMINOPHEN 5-325 MG PO TABS
2.0000 | ORAL_TABLET | Freq: Once | ORAL | Status: AC
Start: 2023-01-31 — End: 2023-01-31
  Administered 2023-01-31: 2 via ORAL
  Filled 2023-01-31: qty 2

## 2023-01-31 MED ORDER — DIAZEPAM 5 MG PO TABS: 5.0000 mg | ORAL_TABLET | Freq: Three times a day (TID) | ORAL | 0 refills | Status: DC | PRN

## 2023-01-31 MED ORDER — NAPROXEN 500 MG PO TABS: 500.0000 mg | ORAL_TABLET | Freq: Two times a day (BID) | ORAL | 0 refills | Status: DC

## 2023-01-31 NOTE — ED Notes (Signed)
Pt not yet in room.

## 2023-01-31 NOTE — ED Triage Notes (Signed)
Pt comes via [facility] from home with c/o syncopal. Pt was standing in kitchen felt pop in her back and then passed out and fell. Pt woke up and glass on face and right arm. Pt not on thinners. Pt given 4 IM zofran in left, fent in right deltoid.  198/91 98% 101 ST CBG-150

## 2023-01-31 NOTE — ED Notes (Signed)
See triage note. Pt also complains of HA, R eye pain, whole body pain, and is crying. Pt is ST on monitor.

## 2023-01-31 NOTE — ED Provider Notes (Signed)
[facility] Provider Note    Event Date/Time   First MD Initiated Contact with Patient 01/31/23 1705     (approximate)   History   Chief Complaint: Fall and Loss of Consciousness   HPI  Audrey [name] is a 47 y.o. female with a history of anxiety, schizoaffective disorder, pseudotumor cerebri who comes to the ED complaining of sudden low back pain, felt like a pop while leaning over the dishwasher.  With sudden onset of low back pain, she passed out and fell forward into the dishwasher, woke up with broken glass around.  No eye pain.  She does complain of pain at the right side of the face as well as right wrist.  Last tetanus shot was 2 years ago.          Physical Exam   Triage Vital Signs: ED Triage Vitals  Encounter Vitals Group     BP 01/31/23 1558 (!) 165/113     Systolic BP Percentile --      Diastolic BP Percentile --      Pulse Rate 01/31/23 1558 (!) 113     Resp 01/31/23 1558 18     Temp 01/31/23 1558 98.4 F (36.9 C)     Temp Source 01/31/23 1558 Oral     SpO2 01/31/23 1558 98 %     Weight 01/31/23 1555 260 lb (117.9 kg)     Height 01/31/23 1555 5\' 3"  (1.6 m)     Head Circumference --      Peak Flow --      Pain Score 01/31/23 1555 10     Pain Loc --      Pain Education --      Exclude from Growth Chart --     Most recent vital signs: Vitals:   01/31/23 1900 01/31/23 2100  BP: (!) 141/101 124/89  Pulse: 98 (!) 106  Resp: 18 19  Temp:    SpO2: 92% 93%    General: Awake, no distress.  CV:  Good peripheral perfusion.  Regular rate and rhythm Resp:  Normal effort.  Clear to auscultation bilaterally Abd:  No distention.  Soft with mild epigastric tenderness Other:  Midline tenderness over the lumbar spine, no step-off.  No tenderness over the right wrist snuffbox   ED Results / Procedures / Treatments   Labs (all labs ordered are listed, but only abnormal results are displayed) Labs Reviewed  BASIC METABOLIC  PANEL - Abnormal; Notable for the following components:      Result Value   Glucose, Bld 100 (*)    All other components within normal limits  URINALYSIS, ROUTINE W REFLEX MICROSCOPIC - Abnormal; Notable for the following components:   Color, Urine YELLOW (*)    APPearance HAZY (*)    Ketones, ur 5 (*)    Leukocytes,Ua TRACE (*)    All other components within normal limits  CBC     EKG    RADIOLOGY X-ray right wrist interpreted by me, negative for fracture.  Radiology report reviewed  CT abdomen pelvis with lumbar spine reformats negative for aortic pathology, compression fracture, herniated disc, or other acute findings.     PROCEDURES:  Procedures   MEDICATIONS ORDERED IN ED: Medications  oxyCODONE-acetaminophen (PERCOCET/ROXICET) 5-325 MG per tablet 2 tablet (has no administration in time range)  diazepam (VALIUM) tablet 5 mg (has no administration in time range)  HYDROmorphone (DILAUDID) injection 1 mg (1 mg Intravenous Given 01/31/23 1805)  HYDROmorphone (DILAUDID) injection  1 mg (1 mg Intravenous Given 01/31/23 2019)  ketorolac (TORADOL) 15 MG/ML injection 15 mg (15 mg Intravenous Given 01/31/23 2019)  iohexol (OMNIPAQUE) 350 MG/ML injection 100 mL (100 mLs Intravenous Contrast Given 01/31/23 2028)     IMPRESSION / MDM / ASSESSMENT AND PLAN / ED COURSE  I reviewed the triage vital signs and the nursing notes.  DDx: Aortic dissection, abdominal aortic aneurysm, lumbar compression fracture, lumbar herniated disc, lumbar strain  Patient's presentation is most consistent with acute presentation with potential threat to life or bodily function.  Patient presents with acute onset of low back pain, causing syncope likely due to vagal response.  No preceding chest pain shortness of breath or thunderclap headache.  In the ED, patient received multiple doses of IV Dilaudid for pain control along with Toradol.  Vital signs overall reassuring.  Serum labs and imaging  unremarkable.  Will treat with Valium and Percocet NSAIDs heat therapy Lidoderm for lumbar strain, follow-up with PCP.       FINAL CLINICAL IMPRESSION(S) / ED DIAGNOSES   Final diagnoses:  Strain of lumbar region, initial encounter     Rx / DC Orders   ED Discharge Orders          Ordered    diazepam (VALIUM) 5 MG tablet  Every 8 hours PRN        01/31/23 2245    oxyCODONE-acetaminophen (PERCOCET) 5-325 MG tablet  Every 4 hours PRN        01/31/23 2245    naproxen (NAPROSYN) 500 MG tablet  2 times daily with meals        01/31/23 2245             Note:  This document was prepared using Dragon voice recognition software and may include unintentional dictation errors.   Sharman Cheek, MD 01/31/23 2250

## 2023-02-01 ENCOUNTER — Institutional Professional Consult (permissible substitution): Payer: 59 | Admitting: Pulmonary Disease

## 2023-02-15 ENCOUNTER — Telehealth: Payer: Self-pay | Admitting: Physician Assistant

## 2023-02-15 NOTE — Telephone Encounter (Addendum)
Patient called in left a voicemail about her prep instruction. She like her prep instruction email to her for her procedure on 02/18/23. Email: [id number]. I called the patient back to let her know we received her voicemail. The patient did answer and was not able to leave a voicemail.

## 2023-02-18 ENCOUNTER — Ambulatory Visit: Payer: 59 | Admitting: Anesthesiology

## 2023-02-18 ENCOUNTER — Encounter: Payer: Self-pay | Admitting: Gastroenterology

## 2023-02-18 ENCOUNTER — Ambulatory Visit
Admission: RE | Admit: 2023-02-18 | Discharge: 2023-02-18 | Disposition: A | Discharge status: Home / Self Care | Payer: 59 | Attending: Gastroenterology | Admitting: Gastroenterology

## 2023-02-18 ENCOUNTER — Encounter
Admission: RE | Disposition: A | Discharge status: Home / Self Care | Payer: Self-pay | Source: Home / Self Care | Attending: Gastroenterology

## 2023-02-18 ENCOUNTER — Other Ambulatory Visit: Payer: Self-pay

## 2023-02-18 DIAGNOSIS — Z79891 Long term (current) use of opiate analgesic: Secondary | ICD-10-CM | POA: Diagnosis not present

## 2023-02-18 DIAGNOSIS — E66813 Obesity, class 3: Secondary | ICD-10-CM | POA: Insufficient documentation

## 2023-02-18 DIAGNOSIS — F209 Schizophrenia, unspecified: Secondary | ICD-10-CM | POA: Diagnosis not present

## 2023-02-18 DIAGNOSIS — Z1211 Encounter for screening for malignant neoplasm of colon: Secondary | ICD-10-CM | POA: Diagnosis present

## 2023-02-18 DIAGNOSIS — F418 Other specified anxiety disorders: Secondary | ICD-10-CM | POA: Diagnosis not present

## 2023-02-18 DIAGNOSIS — I10 Essential (primary) hypertension: Secondary | ICD-10-CM | POA: Insufficient documentation

## 2023-02-18 DIAGNOSIS — R110 Nausea: Secondary | ICD-10-CM

## 2023-02-18 HISTORY — PX: ESOPHAGOGASTRODUODENOSCOPY (EGD) WITH PROPOFOL: SHX5813

## 2023-02-18 HISTORY — PX: COLONOSCOPY WITH PROPOFOL: SHX5780

## 2023-02-18 SURGERY — COLONOSCOPY WITH PROPOFOL
Anesthesia: General

## 2023-02-18 MED ORDER — LIDOCAINE HCL URETHRAL/MUCOSAL 2 % EX GEL
CUTANEOUS | Status: DC | PRN
  Administered 2023-02-18: 1 via TOPICAL

## 2023-02-18 MED ORDER — DEXMEDETOMIDINE HCL IN NACL 80 MCG/20ML IV SOLN
INTRAVENOUS | Status: DC | PRN
  Administered 2023-02-18: 12 ug via INTRAVENOUS
  Administered 2023-02-18: 8 ug via INTRAVENOUS

## 2023-02-18 MED ORDER — LIDOCAINE HCL (PF) 2 % IJ SOLN
INTRAMUSCULAR | Status: DC | PRN
  Administered 2023-02-18 (×2): 100 mg via INTRADERMAL

## 2023-02-18 MED ORDER — SODIUM CHLORIDE 0.9 % IV SOLN
INTRAVENOUS | Status: DC
Start: 2023-02-18 — End: 2023-02-18

## 2023-02-18 MED ORDER — PROPOFOL 10 MG/ML IV BOLUS
INTRAVENOUS | Status: DC | PRN
  Administered 2023-02-18: 100 mg via INTRAVENOUS
  Administered 2023-02-18: 175 ug/kg/min via INTRAVENOUS
  Administered 2023-02-18: 100 mg via INTRAVENOUS

## 2023-02-18 NOTE — H&P (Signed)
Audrey Minium, MD [facility] [address] [place], [place] [address] Phone:[phone] Fax : [phone]  Primary Care Physician:  Audrey Canary, PA-C Primary Gastroenterologist:  Dr. Servando Snare  Pre-Procedure History & Physical: HPI:  Audrey [name] is a 47 y.o. female is here for an endoscopy and colonoscopy.   Past Medical History:  Diagnosis Date   Anxiety    Arthritis    DDD (degenerative disc disease), lumbar 12/10/2020   Depression    Heart murmur    Pseudotumor cerebri    PTSD (post-traumatic stress disorder)    Schizo affective schizophrenia (HCC)    Sciatica     Past Surgical History:  Procedure Laterality Date   ABDOMINAL HYSTERECTOMY     ABDOMINAL SURGERY     APPENDECTOMY     CARDIAC CATHETERIZATION     CHOLECYSTECTOMY     KNEE SURGERY     TONSILLECTOMY     TUBAL LIGATION      Prior to Admission medications   Medication Sig Start Date End Date Taking? Authorizing Provider  albuterol (VENTOLIN HFA) 108 (90 Base) MCG/ACT inhaler Inhale 2 puffs into the lungs every 6 (six) hours as needed for wheezing or shortness of breath. 04/10/22  Yes Paduchowski, Caryn Bee, MD  clonazePAM (KLONOPIN) 1 MG tablet Take 1 mg by mouth 2 (two) times daily.   Yes [provider]  cloNIDine (CATAPRES) 0.1 MG tablet Take 0.1 mg by mouth 2 (two) times daily. 10/17/22  Yes [provider]  losartan (COZAAR) 50 MG tablet Take 50 mg by mouth daily. 10/01/21  Yes [provider]  mirtazapine (REMERON SOL-TAB) 15 MG disintegrating tablet Take 15 mg by mouth at bedtime. 11/04/21  Yes [provider]  naproxen (NAPROSYN) 500 MG tablet Take 1 tablet (500 mg total) by mouth 2 (two) times daily with a meal. 01/31/23  Yes Sharman Cheek, MD  tiZANidine (ZANAFLEX) 2 MG tablet Take 1 tablet (2 mg total) by mouth 2 (two) times daily. 08/13/21  Yes Azucena Fallen, MD  diazepam (VALIUM) 5 MG tablet Take 1 tablet (5 mg total) by mouth every 8 (eight) hours as  needed for muscle spasms. Patient not taking: Reported on 02/18/2023 01/31/23   Sharman Cheek, MD  naloxone [facility]) nasal spray 4 mg/0.1 mL Place 1 spray into the nose once as needed (for accidental overdose). 02/26/21   [provider]  ondansetron (ZOFRAN) 4 MG tablet Take 1 tablet (4 mg total) by mouth daily as needed for nausea or vomiting. 10/17/22 10/17/23  Pilar Jarvis, MD  QUEtiapine (SEROQUEL) 100 MG tablet Take 100 mg by mouth 3 (three) times daily. Patient not taking: Reported on 02/18/2023 11/04/21   [provider]    Allergies as of 01/06/2023 - Review Complete 01/06/2023  Allergen Reaction Noted   Penicillins Anaphylaxis, Rash, and Hives 11/29/2020   Prochlorperazine Anaphylaxis and Nausea And Vomiting 11/29/2020   Propoxyphene Nausea And Vomiting and Other (See Comments) 07/31/2014   Acetazolamide Other (See Comments) 07/31/2014   Latex Hives and Swelling 09/11/2014   Nitroglycerin er Swelling and Other (See Comments) 11/29/2020    Family History  Problem Relation Age of Onset   Bipolar disorder Mother    Schizophrenia Sister    Schizophrenia Sister    Anxiety disorder Other    Depression Other    Suicidality Neg Hx     Social History   Socioeconomic History   Marital status: Married    Spouse name: Not on file   Number  of children: Not on file   Years of education: Not on file   Highest education level: Not on file  Occupational History   Not on file  Tobacco Use   Smoking status: Never   Smokeless tobacco: Never  Vaping Use   Vaping status: Never Used  Substance and Sexual Activity   Alcohol use: Never   Drug use: Never   Sexual activity: Yes  Other Topics Concern   Not on file  Social History Narrative   Not on file   Social Determinants of Health   Financial Resource Strain: Low Risk  (11/10/2022)   Received from [facility]   Overall Financial Resource Strain (CARDIA)    Difficulty of Paying Living Expenses: Not hard at  all  Food Insecurity: Food Insecurity Present (11/10/2022)   Received from [facility]   Hunger Vital Sign    Worried About Running Out of Food in the Last Year: Sometimes true    Ran Out of Food in the Last Year: Sometimes true  Transportation Needs: No Transportation Needs (11/10/2022)   Received from [facility] - Transportation    Lack of Transportation (Medical): No    Lack of Transportation (Non-Medical): No  Physical Activity: Sufficiently Active (11/10/2022)   Received from [facility]   Exercise Vital Sign    Days of Exercise per Week: 5 days    Minutes of Exercise per Session: 30 min  Stress: Stress Concern Present (11/10/2022)   Received from [facility] - Occupational Stress Questionnaire    Feeling of Stress : Very much  Social Connections: Somewhat Isolated (11/10/2022)   Received from [facility]   Social Network    How would you rate your social network (family, work, friends)?: Restricted participation with some degree of social isolation  Intimate Partner Violence: Not At Risk (11/10/2022)   Received from [facility]   HITS    Over the last 12 months how often did your partner physically hurt you?: Never    Over the last 12 months how often did your partner insult you or talk down to you?: Never    Over the last 12 months how often did your partner threaten you with physical harm?: Never    Over the last 12 months how often did your partner scream or curse at you?: Never    Review of Systems: See HPI, otherwise negative ROS  Physical Exam: BP 118/65   Pulse (!) 114   Temp 98.1 F (36.7 C) (Temporal)   Resp 18   Ht 5\' 3"  (1.6 m)   Wt 115 kg   SpO2 94%   BMI 44.92 kg/m  General:   Alert,  pleasant and cooperative in NAD Head:  Normocephalic and atraumatic. Neck:  Supple; no masses or thyromegaly. Lungs:  Clear throughout to auscultation.    Heart:  Regular rate and rhythm. Abdomen:  Soft,  nontender and nondistended. Normal bowel sounds, without guarding, and without rebound.   Neurologic:  Alert and  oriented x4;  grossly normal neurologically.  Impression/Plan: Marcelle Smiling Odor is here for an endoscopy and colonoscopy to be performed for nausea and screening  Risks, benefits, limitations, and alternatives regarding  endoscopy and colonoscopy have been reviewed with the patient.  Questions have been answered.  All parties agreeable.   Audrey Minium, MD  02/18/2023, 9:31 AM

## 2023-02-18 NOTE — Op Note (Signed)
[facility] Patient Name: Audrey [name] Procedure Date: 02/18/2023 9:34 AM MRN: [id number] Account #: [id number] Date of Birth: 1975-08-11 Admit Type: Outpatient Age: 47 Room: [facility] ENDO ROOM 4 Gender: Female Note Status: Finalized Instrument Name: Upper Endoscope [id number] Procedure:             Upper GI endoscopy Indications:           Nausea Providers:             [name] MD, MD Referring MD:          Nathaneil Canary (Referring MD) Medicines:             Propofol per Anesthesia Complications:         No immediate complications. Procedure:             Pre-Anesthesia Assessment:                        - Prior to the procedure, a History and Physical was                         performed, and patient medications and allergies were                         reviewed. The patient's tolerance of previous                         anesthesia was also reviewed. The risks and benefits                         of the procedure and the sedation options and risks                         were discussed with the patient. All questions were                         answered, and informed consent was obtained. Prior                         Anticoagulants: The patient has taken no anticoagulant                         or antiplatelet agents. ASA Grade Assessment: II - A                         patient with mild systemic disease. After reviewing                         the risks and benefits, the patient was deemed in                         satisfactory condition to undergo the procedure.                        After obtaining informed consent, the endoscope was                         passed under direct vision. Throughout the procedure,  the patient's blood pressure, pulse, and oxygen                         saturations were monitored continuously. The Endoscope                         was introduced through the mouth, and advanced to  the                         second part of duodenum. The upper GI endoscopy was                         accomplished without difficulty. The patient tolerated                         the procedure well. Findings:      The examined esophagus was normal.      The stomach was normal.      The examined duodenum was normal. Impression:            - Normal esophagus.                        - Normal stomach.                        - Normal examined duodenum.                        - No specimens collected. Recommendation:        - Discharge patient to home.                        - Resume previous diet.                        - Continue present medications. Procedure Code(s):     --- Professional ---                        [phone], Esophagogastroduodenoscopy, flexible,                         transoral; diagnostic, including collection of                         specimen(s) by brushing or washing, when performed                         (separate procedure) Diagnosis Code(s):     --- Professional ---                        R11.0, Nausea CPT copyright 2022 American Medical Association. All rights reserved. The codes documented in this report are preliminary and upon coder review may  be revised to meet current compliance requirements. [name] MD, MD 02/18/2023 9:45:49 AM This report has been signed electronically. Number of Addenda: 0 Note Initiated On: 02/18/2023 9:34 AM Estimated Blood Loss:  Estimated blood loss: none.      [facility]

## 2023-02-18 NOTE — Anesthesia Preprocedure Evaluation (Signed)
Anesthesia Evaluation  Patient identified by MRN, date of birth, ID band Patient awake    Reviewed: Allergy & Precautions, NPO status , Patient's Chart, lab work & pertinent test results  Airway Mallampati: III  TM Distance: >3 FB Neck ROM: full    Dental no notable dental hx.    Pulmonary asthma    Pulmonary exam normal        Cardiovascular hypertension, On Medications Normal cardiovascular exam     Neuro/Psych  PSYCHIATRIC DISORDERS Anxiety Depression  Schizophrenia   Neuromuscular disease (chronic narcotic use with past OD on dilaudid)    GI/Hepatic Neg liver ROS, PUD,,,  Endo/Other    Class 3 obesity  Renal/GU negative Renal ROS  negative genitourinary   Musculoskeletal   Abdominal   Peds  Hematology negative hematology ROS (+)   Anesthesia Other Findings Past Medical History: No date: Anxiety No date: Arthritis 12/10/2020: DDD (degenerative disc disease), lumbar No date: Depression No date: Heart murmur No date: Pseudotumor cerebri No date: PTSD (post-traumatic stress disorder) No date: Schizo affective schizophrenia (HCC) No date: Sciatica  Past Surgical History: No date: ABDOMINAL HYSTERECTOMY No date: ABDOMINAL SURGERY No date: APPENDECTOMY No date: CARDIAC CATHETERIZATION No date: CHOLECYSTECTOMY No date: KNEE SURGERY No date: TONSILLECTOMY No date: TUBAL LIGATION     Reproductive/Obstetrics negative OB ROS                             Anesthesia Physical Anesthesia Plan  ASA: 3  Anesthesia Plan: General   Post-op Pain Management: Minimal or no pain anticipated   Induction: Intravenous  PONV Risk Score and Plan: 2 and Propofol infusion and TIVA  Airway Management Planned: Natural Airway and Nasal Cannula  Additional Equipment:   Intra-op Plan:   Post-operative Plan:   Informed Consent: I have reviewed the patients History and Physical, chart, labs and  discussed the procedure including the risks, benefits and alternatives for the proposed anesthesia with the patient or authorized representative who has indicated his/her understanding and acceptance.     Dental Advisory Given  Plan Discussed with: Anesthesiologist, CRNA and Surgeon  Anesthesia Plan Comments: (Patient consented for risks of anesthesia including but not limited to:  - adverse reactions to medications - risk of airway placement if required - damage to eyes, teeth, lips or other oral mucosa - nerve damage due to positioning  - sore throat or hoarseness - Damage to heart, brain, nerves, lungs, other parts of body or loss of life  Patient voiced understanding and assent.)       Anesthesia Quick Evaluation

## 2023-02-18 NOTE — Transfer of Care (Signed)
Immediate Anesthesia Transfer of Care Note  Patient: Audrey [name]  Procedure(s) Performed: COLONOSCOPY WITH PROPOFOL ESOPHAGOGASTRODUODENOSCOPY (EGD) WITH PROPOFOL  Patient Location: PACU  Anesthesia Type:General  Level of Consciousness: awake, alert , and oriented  Airway & Oxygen Therapy: Patient Spontanous Breathing  Post-op Assessment: Report given to RN and Post -op Vital signs reviewed and stable  Post vital signs: stable  Last Vitals:  Vitals Value Taken Time  BP 107/68 02/18/23 1003  Temp    Pulse 93 02/18/23 1005  Resp 21 02/18/23 1005  SpO2 93 % 02/18/23 1005  Vitals shown include unfiled device data.  Last Pain:  Vitals:   02/18/23 1003  TempSrc:   PainSc: 0-No pain         Complications: No notable events documented.

## 2023-02-18 NOTE — Anesthesia Postprocedure Evaluation (Signed)
Anesthesia Post Note  Patient: [name]  Procedure(s) Performed: COLONOSCOPY WITH PROPOFOL ESOPHAGOGASTRODUODENOSCOPY (EGD) WITH PROPOFOL  Patient location during evaluation: Endoscopy Anesthesia Type: General Level of consciousness: awake and alert Pain management: pain level controlled Vital Signs Assessment: post-procedure vital signs reviewed and stable Respiratory status: spontaneous breathing, nonlabored ventilation, respiratory function stable and patient connected to nasal cannula oxygen Cardiovascular status: blood pressure returned to baseline and stable Postop Assessment: no apparent nausea or vomiting Anesthetic complications: no   There were no known notable events for this encounter.   Last Vitals:  Vitals:   02/18/23 1003 02/18/23 1023  BP:  129/84  Pulse:    Resp:    Temp: 36.7 C   SpO2:      Last Pain:  Vitals:   02/18/23 1023  TempSrc:   PainSc: 0-No pain                 Audrey [name]

## 2023-02-18 NOTE — Op Note (Signed)
[facility] Patient Name: Annetta Stentz Procedure Date: 02/18/2023 9:33 AM MRN: [id number] Account #: [id number] Date of Birth: Nov 10, 1975 Admit Type: Outpatient Age: 47 Room: [facility] ENDO ROOM 4 Gender: Female Note Status: Finalized Instrument Name: Nelda Marseille [id number] Procedure:             Colonoscopy Indications:           Screening for colorectal malignant neoplasm Providers:             [name] MD, MD Referring MD:          Nathaneil Canary (Referring MD) Medicines:             Propofol per Anesthesia Complications:         No immediate complications. Procedure:             Pre-Anesthesia Assessment:                        - Prior to the procedure, a History and Physical was                         performed, and patient medications and allergies were                         reviewed. The patient's tolerance of previous                         anesthesia was also reviewed. The risks and benefits                         of the procedure and the sedation options and risks                         were discussed with the patient. All questions were                         answered, and informed consent was obtained. Prior                         Anticoagulants: The patient has taken no anticoagulant                         or antiplatelet agents. ASA Grade Assessment: II - A                         patient with mild systemic disease. After reviewing                         the risks and benefits, the patient was deemed in                         satisfactory condition to undergo the procedure.                        After obtaining informed consent, the colonoscope was                         passed under direct vision. Throughout the procedure,  the patient's blood pressure, pulse, and oxygen                         saturations were monitored continuously. The                         Colonoscope was introduced  through the anus and                         advanced to the the cecum, identified by appendiceal                         orifice and ileocecal valve. The colonoscopy was                         performed without difficulty. The patient tolerated                         the procedure well. The quality of the bowel                         preparation was excellent. Findings:      The perianal and digital rectal examinations were normal. Impression:            - No specimens collected. Recommendation:        - Discharge patient to home.                        - Resume previous diet.                        - Continue present medications.                        - Repeat colonoscopy in 10 years for screening                         purposes. Procedure Code(s):     --- Professional ---                        [phone], Colonoscopy, flexible; diagnostic, including                         collection of specimen(s) by brushing or washing, when                         performed (separate procedure) Diagnosis Code(s):     --- Professional ---                        Z12.11, Encounter for screening for malignant neoplasm                         of colon CPT copyright 2022 American Medical Association. All rights reserved. The codes documented in this report are preliminary and upon coder review may  be revised to meet current compliance requirements. [name] MD, MD 02/18/2023 9:59:46 AM This report has been signed electronically. Number of Addenda: 0 Note Initiated On: 02/18/2023 9:33 AM Scope Withdrawal Time: 0 hours 7 minutes 55 seconds  Total Procedure Duration:  0 hours 10 minutes 14 seconds  Estimated Blood Loss:  Estimated blood loss: none.      [facility]

## 2023-02-19 ENCOUNTER — Encounter: Payer: Self-pay | Admitting: Gastroenterology

## 2023-02-23 ENCOUNTER — Encounter: Payer: 59 | Admitting: Physical Therapy

## 2023-03-28 ENCOUNTER — Other Ambulatory Visit: Payer: Self-pay

## 2023-03-28 ENCOUNTER — Emergency Department
Admission: EM | Admit: 2023-03-28 | Discharge: 2023-03-28 | Discharge status: Against Medical Advice | Payer: 59 | Attending: Emergency Medicine | Admitting: Emergency Medicine

## 2023-03-28 DIAGNOSIS — Z5321 Procedure and treatment not carried out due to patient leaving prior to being seen by health care provider: Secondary | ICD-10-CM | POA: Diagnosis not present

## 2023-03-28 DIAGNOSIS — R519 Headache, unspecified: Secondary | ICD-10-CM | POA: Insufficient documentation

## 2023-03-28 DIAGNOSIS — H538 Other visual disturbances: Secondary | ICD-10-CM | POA: Diagnosis not present

## 2023-03-28 NOTE — ED Triage Notes (Signed)
C/O waking up this morning with headache and blurred vision. Describes only being able to see shapes and feels that peripheral vision closing in. States has lost vision in the past for 5 days from pseudotumor cerebri.  AAOx3.  Skin warm and dry. NAD

## 2023-05-11 ENCOUNTER — Other Ambulatory Visit: Payer: Self-pay

## 2023-05-11 ENCOUNTER — Emergency Department
Admission: EM | Admit: 2023-05-11 | Discharge: 2023-05-11 | Disposition: A | Discharge status: Home / Self Care | Payer: 59 | Attending: Emergency Medicine | Admitting: Emergency Medicine

## 2023-05-11 ENCOUNTER — Emergency Department: Payer: 59

## 2023-05-11 DIAGNOSIS — G43811 Other migraine, intractable, with status migrainosus: Secondary | ICD-10-CM | POA: Diagnosis not present

## 2023-05-11 DIAGNOSIS — G932 Benign intracranial hypertension: Secondary | ICD-10-CM | POA: Insufficient documentation

## 2023-05-11 DIAGNOSIS — R519 Headache, unspecified: Secondary | ICD-10-CM | POA: Diagnosis present

## 2023-05-11 DIAGNOSIS — F259 Schizoaffective disorder, unspecified: Secondary | ICD-10-CM | POA: Insufficient documentation

## 2023-05-11 DIAGNOSIS — Z20822 Contact with and (suspected) exposure to covid-19: Secondary | ICD-10-CM | POA: Insufficient documentation

## 2023-05-11 LAB — CBC WITH DIFFERENTIAL/PLATELET
Abs Immature Granulocytes: 0.02 10*3/uL (ref 0.00–0.07)
Basophils Absolute: 0.1 10*3/uL (ref 0.0–0.1)
Basophils Relative: 1 %
Eosinophils Absolute: 0 10*3/uL (ref 0.0–0.5)
Eosinophils Relative: 0 %
HCT: 48 % — ABNORMAL HIGH (ref 36.0–46.0)
Hemoglobin: 15.3 g/dL — ABNORMAL HIGH (ref 12.0–15.0)
Immature Granulocytes: 0 %
Lymphocytes Relative: 62 %
Lymphs Abs: 4.6 10*3/uL — ABNORMAL HIGH (ref 0.7–4.0)
MCH: 29.9 pg (ref 26.0–34.0)
MCHC: 31.9 g/dL (ref 30.0–36.0)
MCV: 93.9 fL (ref 80.0–100.0)
Monocytes Absolute: 0.4 10*3/uL (ref 0.1–1.0)
Monocytes Relative: 6 %
Neutro Abs: 2.3 10*3/uL (ref 1.7–7.7)
Neutrophils Relative %: 31 %
Platelets: 276 10*3/uL (ref 150–400)
RBC: 5.11 MIL/uL (ref 3.87–5.11)
RDW: 13.6 % (ref 11.5–15.5)
WBC: 7.3 10*3/uL (ref 4.0–10.5)
nRBC: 0 % (ref 0.0–0.2)

## 2023-05-11 LAB — BASIC METABOLIC PANEL
Anion gap: 13 (ref 5–15)
BUN: 7 mg/dL (ref 6–20)
CO2: 26 mmol/L (ref 22–32)
Calcium: 9.4 mg/dL (ref 8.9–10.3)
Chloride: 99 mmol/L (ref 98–111)
Creatinine, Ser: 0.92 mg/dL (ref 0.44–1.00)
GFR, Estimated: >60 mL/min (ref >60)
Glucose, Bld: 93 mg/dL (ref 70–99)
Potassium: 3.9 mmol/L (ref 3.5–5.1)
Sodium: 138 mmol/L (ref 135–145)

## 2023-05-11 LAB — RESP PANEL BY RT-PCR (RSV, FLU A&B, COVID)  RVPGX2
Influenza A by PCR: NEGATIVE
Influenza B by PCR: NEGATIVE
Resp Syncytial Virus by PCR: NEGATIVE
SARS Coronavirus 2 by RT PCR: NEGATIVE

## 2023-05-11 MED ORDER — DIPHENHYDRAMINE HCL 50 MG/ML IJ SOLN
12.5000 mg | Freq: Once | INTRAMUSCULAR | Status: AC
  Administered 2023-05-11: 12.5 mg via INTRAVENOUS
  Filled 2023-05-11: qty 1

## 2023-05-11 MED ORDER — TOPIRAMATE 25 MG PO TABS: 25.0000 mg | ORAL_TABLET | Freq: Every day | ORAL | 1 refills | Status: DC

## 2023-05-11 MED ORDER — BUTALBITAL-APAP-CAFFEINE 50-325-40 MG PO TABS
2.0000 | ORAL_TABLET | Freq: Once | ORAL | Status: AC
  Administered 2023-05-11: 2 via ORAL
  Filled 2023-05-11: qty 2

## 2023-05-11 MED ORDER — PROMETHAZINE HCL 12.5 MG PO TABS: 12.5000 mg | ORAL_TABLET | Freq: Four times a day (QID) | ORAL | 0 refills | Status: DC | PRN

## 2023-05-11 MED ORDER — SODIUM CHLORIDE 0.9 % IV BOLUS
500.0000 mL | Freq: Once | INTRAVENOUS | Status: AC
  Administered 2023-05-11: 500 mL via INTRAVENOUS

## 2023-05-11 MED ORDER — MORPHINE SULFATE (PF) 4 MG/ML IV SOLN
4.0000 mg | Freq: Once | INTRAVENOUS | Status: AC
  Administered 2023-05-11: 4 mg via INTRAVENOUS
  Filled 2023-05-11: qty 1

## 2023-05-11 MED ORDER — SODIUM CHLORIDE 0.9 % IV SOLN
12.5000 mg | Freq: Once | INTRAVENOUS | Status: AC
  Administered 2023-05-11: 12.5 mg via INTRAVENOUS
  Filled 2023-05-11: qty 12.5

## 2023-05-11 MED ORDER — DEXAMETHASONE SODIUM PHOSPHATE 10 MG/ML IJ SOLN
10.0000 mg | Freq: Once | INTRAMUSCULAR | Status: AC
  Administered 2023-05-11: 10 mg via INTRAVENOUS
  Filled 2023-05-11: qty 1

## 2023-05-11 MED ORDER — KETOROLAC TROMETHAMINE 30 MG/ML IJ SOLN
15.0000 mg | Freq: Once | INTRAMUSCULAR | Status: AC
  Administered 2023-05-11: 15 mg via INTRAVENOUS
  Filled 2023-05-11: qty 1

## 2023-05-11 NOTE — ED Notes (Addendum)
Pt was not able to read the letters starting at the 20/70. Stated vision is double but usually  goes away 3 days post treatment

## 2023-05-11 NOTE — ED Notes (Signed)
This RN attempted x2 IV sticks.

## 2023-05-11 NOTE — ED Notes (Signed)
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

## 2023-05-11 NOTE — ED Provider Notes (Signed)
[facility] Emergency Department Provider Note ____________________________________________  Time seen: Approximately 8:19 AM  I have reviewed the triage vital signs and the nursing notes.   HISTORY  Chief Complaint Headache   HPI Audrey [name] is a 48 y.o. female with longstanding history of migraines, ocular migraine, and pseudotumor cerebri, schizoaffective disorder presents to the emergency department for headache that started yesterday and awakened her at 130 this morning.  She has also had some double vision and nausea which is common during migraine.  She denies extremity weakness or slurred speech.  She reports tingling in the fingertips and toes which is also common when she develops these headaches. Headaches had been well controlled until about November when she again started having 1-2 headaches per week.  Location: [place] Similar to previous headaches: Yes Duration: 1:30 AM TIMING: No known trigger SEVERITY: 8 QUALITY: Throb CONTEXT: No known exposures MODIFYING FACTORS: None ASSOCIATED SYMPTOMS: Double vision and nausea  Past Medical History:  Diagnosis Date   Anxiety    Arthritis    DDD (degenerative disc disease), lumbar 12/10/2020   Depression    Heart murmur    Pseudotumor cerebri    PTSD (post-traumatic stress disorder)    Schizo affective schizophrenia (HCC)    Sciatica    ___________________________________________   PHYSICAL EXAM:  VITAL SIGNS: ED Triage Vitals  Encounter Vitals Group     BP 05/11/23 0553 136/89     Systolic BP Percentile --      Diastolic BP Percentile --      Pulse Rate 05/11/23 0553 (!) 118     Resp 05/11/23 0553 20     Temp 05/11/23 0553 97.9 F (36.6 C)     Temp Source 05/11/23 0553 Oral     SpO2 05/11/23 0553 96 %     Weight 05/11/23 0552 250 lb (113.4 kg)     Height 05/11/23 0552 5\' 3"  (1.6 m)     Head Circumference --      Peak Flow --      Pain Score 05/11/23 0552 8     Pain Loc --       Pain Education --      Exclude from Growth Chart --     Constitutional: Alert and oriented. Well appearing and in no acute distress. Eyes: Conjunctivae are normal. PERRL. EOMI without expressed pain. No evidence of papilledema on limited exam. Head: Atraumatic. Nose: No congestion/rhinnorhea. Mouth/Throat: Mucous membranes are moist.  Oropharynx non-erythematous. Neck: No stridor. Supple, no meningismus.  Cardiovascular: Normal rate, regular rhythm. Grossly normal heart sounds.  Good peripheral circulation. Respiratory: Normal respiratory effort.  No retractions. Lungs CTAB. Gastrointestinal: Soft and nontender. No distention.  Musculoskeletal: No lower extremity tenderness nor edema.  No joint effusions. Neurologic:  Normal speech and language. No gross focal neurologic deficits are appreciated. No gait instability. Cranial nerves: 2-10 normal as tested. Cerebellar: normal gait. Sensorimotor: No aphasia, pronator drift, clonus, sensory loss or abnormal reflexes.  Skin:  Skin is warm, dry and intact. No rash noted. Psychiatric: Mood and affect are normal. Speech and behavior are normal. Normal thought process and cognition.  ____________________________________________   LABS (all labs ordered are listed, but only abnormal results are displayed)  Labs Reviewed  CBC WITH DIFFERENTIAL/PLATELET - Abnormal; Notable for the following components:      Result Value   Hemoglobin 15.3 (*)    HCT 48.0 (*)    Lymphs Abs 4.6 (*)    All other components within normal  limits  RESP PANEL BY RT-PCR (RSV, FLU A&B, COVID)  RVPGX2  BASIC METABOLIC PANEL   ____________________________________________  EKG  Not indicated. ____________________________________________  RADIOLOGY  CT HEAD WO CONTRAST ( ) Result Date: 05/11/2023 CLINICAL DATA:  Headache, double vision, and nausea. Classic migraine. EXAM: CT HEAD WITHOUT CONTRAST TECHNIQUE: Contiguous axial images were obtained from the base  of the skull through the vertex without intravenous contrast. RADIATION DOSE REDUCTION: This exam was performed according to the departmental dose-optimization program which includes automated exposure control, adjustment of the mA and/or kV according to patient size and/or use of iterative reconstruction technique. COMPARISON:  08/02/2021 head CT FINDINGS: Brain: No evidence of acute infarction, hemorrhage, hydrocephalus, extra-axial collection or mass lesion/mass effect. Streak artifact across the ventral brain. Vascular: No hyperdense vessel or unexpected calcification. Skull: Normal. Negative for fracture or focal lesion. Sinuses/Orbits: No acute finding. IMPRESSION: No acute finding Electronically Signed   By: Tiburcio Pea M.D.   On: 05/11/2023 06:24   ____________________________________________   PROCEDURES  Procedure(s) performed:  Procedures  Critical Care performed: No ____________________________________________   INITIAL IMPRESSION / ASSESSMENT AND PLAN / ED COURSE  48 year old female presenting to the emergency department for acute on chronic headache.  Patient denies new symptoms associated with this headache.  She states that in the past she has been given migraine cocktail with improvement of all of her symptoms except for the blurred vision or tunnel vision.  Plan will be to proceed with the same.  Patient states that she is able to take the Phenergan but the Reglan typically causes her to be tachycardic and have a panic attack for which she typically needs Ativan.  Phenergan, Toradol, and Benadryl ordered.  ----------------------------------------- 10:35 AM on 05/11/2023 ----------------------------------------- Nausea has resolved, headache and vision changes remain. Decadron and morphine ordered.  ----------------------------------------- 12:02 PM on 05/11/2023 ----------------------------------------- Focal headache has resolved, but she continues to report general  headache over crown of head with throbbing pain and visual disturbance.  She tells me that since moving to this area from [place], her pain management provider has been treating her headaches and she has not establish care with a neurologist.  Review of outside records indicate that while in [place] the neurologist was prescribing Emgality and Fioricet for rescue.  Patient states that the combination of the 2 was working well but when she moved here pain management chose to discontinue those medications.  She was admitted March 2023 at [facility] secondary to headache symptoms as she is describing today.  Therapeutic LP has provided relief in the past. Last LP was during the admission at [facility]. Patient states that it requires fluoroscopy secondary to scar tissue.   Although headache symptoms are improving, vision disturbance is not. Plan will be to discuss with neurology.   ----------------------------------------- 1:10 PM on 05/11/2023 ----------------------------------------- Consulted with Dr. Selina Cooley, neurology on call. She was able to view past records. She and I agree that patient is a candidate for outpatient follow up.  Patient is not currently on Topiramate and not established with neurology. I will start her on 25mg  and have her increase it to 50mg  after 1-2 weeks if headaches are not becoming less frequent. Patient will be given very strict ER return precautions as well.   Patient appears more comfortable. She is now in the room without sunglasses on.  This was all discussed with the patient and family. They are comfortable with the plan and she agrees to return to the ER if vision worsens or persists  for more than 2 days.  Pertinent labs & imaging results that were available during my care of the patient were reviewed by me and considered in my medical decision making (see chart for details). ____________________________________________   FINAL CLINICAL IMPRESSION(S) / ED  DIAGNOSES  Final diagnoses:  Other migraine with status migrainosus, intractable  Pseudotumor cerebri    ED Discharge Orders          Ordered    topiramate (TOPAMAX) 25 MG tablet  Daily at bedtime        05/11/23 1304    promethazine (PHENERGAN) 12.5 MG tablet  Every 6 hours PRN,   Status:  Discontinued        05/11/23 1304    promethazine (PHENERGAN) 12.5 MG tablet  Every 6 hours PRN        05/11/23 1305              Chinita Pester, FNP 05/11/23 1317    Sharyn Creamer, MD 05/12/23 2320

## 2023-05-11 NOTE — ED Triage Notes (Addendum)
Pt reports waking up at 0130 this morning with a headache doubled vision and nausea. Pt reports she had headache yesterday also but no other symptoms. Pt reports hx migraines.

## 2023-06-30 IMAGING — CR DG CHEST 2V
2 series · 2 of 2 positions shown · non-contrast
Comparison: None.

CLINICAL DATA: Shortness of breath

EXAM:
CHEST - 2 VIEW

[w chest pa]
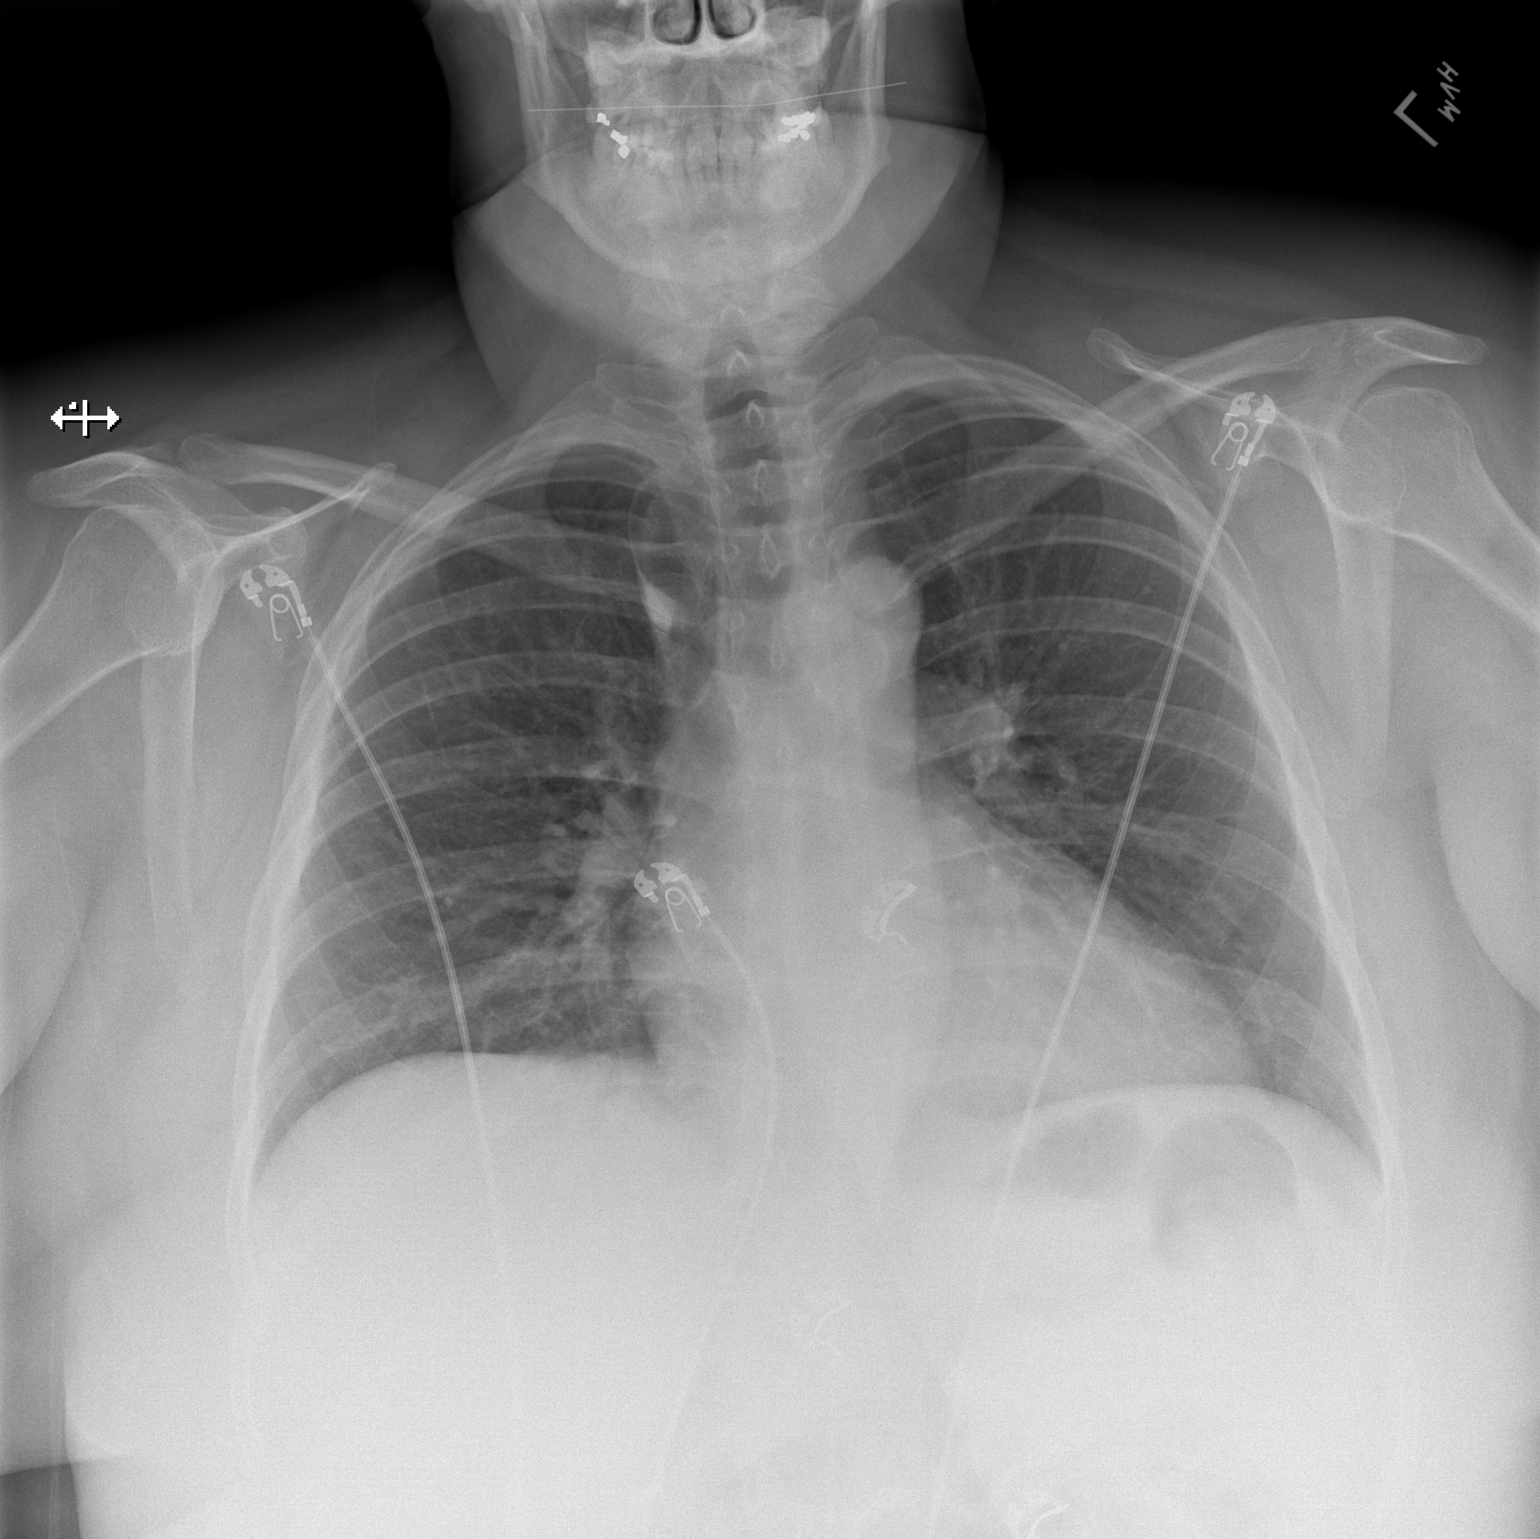

[w chest lat]
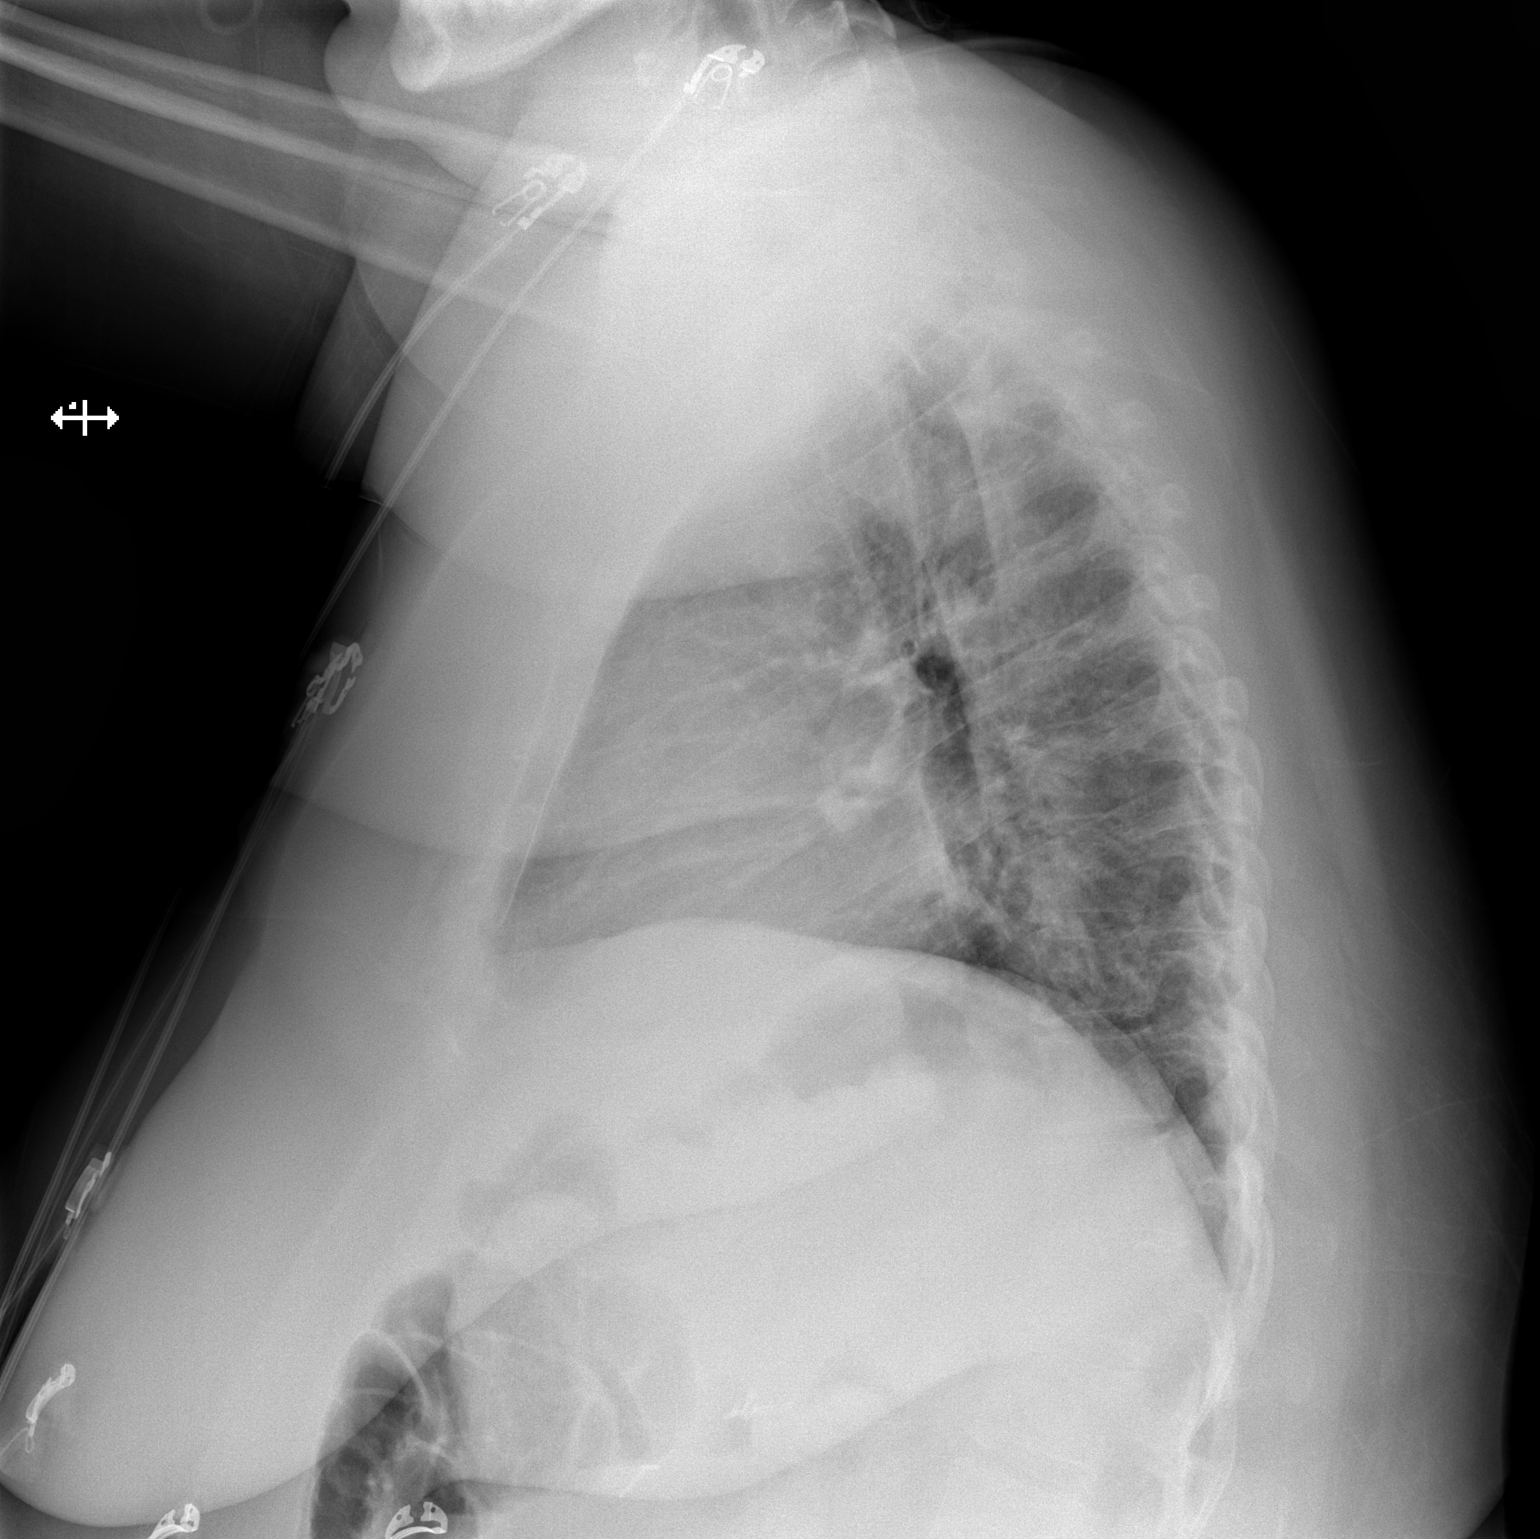

[2 of 2 positions shown; findings below may reference images not displayed]

FINDINGS: Borderline enlarged cardiac silhouette. There is no focal airspace
disease. There is no large pleural effusion or visible pneumothorax.
Incidental azygous fissure, normal variant. There is no acute
osseous abnormality.
IMPRESSION: Borderline enlarged cardiac silhouette.  No focal airspace disease.

## 2023-07-12 ENCOUNTER — Other Ambulatory Visit: Payer: Self-pay

## 2023-07-12 ENCOUNTER — Encounter: Payer: Self-pay | Admitting: Emergency Medicine

## 2023-07-12 ENCOUNTER — Emergency Department
Admission: EM | Admit: 2023-07-12 | Discharge: 2023-07-12 | Disposition: A | Discharge status: Home / Self Care | Attending: Emergency Medicine | Admitting: Emergency Medicine

## 2023-07-12 DIAGNOSIS — F1193 Opioid use, unspecified with withdrawal: Secondary | ICD-10-CM | POA: Insufficient documentation

## 2023-07-12 DIAGNOSIS — R45851 Suicidal ideations: Secondary | ICD-10-CM | POA: Diagnosis not present

## 2023-07-12 MED ORDER — ONDANSETRON 4 MG PO TBDP
4.0000 mg | ORAL_TABLET | Freq: Once | ORAL | Status: AC
  Administered 2023-07-12: 4 mg via ORAL
  Filled 2023-07-12: qty 1

## 2023-07-12 MED ORDER — HYDROXYZINE HCL 25 MG PO TABS
25.0000 mg | ORAL_TABLET | Freq: Once | ORAL | Status: AC
  Administered 2023-07-12: 25 mg via ORAL
  Filled 2023-07-12: qty 1

## 2023-07-12 MED ORDER — HYDROXYZINE HCL 25 MG PO TABS: 25.0000 mg | ORAL_TABLET | Freq: Three times a day (TID) | ORAL | 0 refills | Status: DC | PRN

## 2023-07-12 MED ORDER — CLONIDINE HCL 0.1 MG PO TABS
0.2000 mg | ORAL_TABLET | Freq: Once | ORAL | Status: AC
  Administered 2023-07-12: 0.2 mg via ORAL
  Filled 2023-07-12: qty 2

## 2023-07-12 MED ORDER — ONDANSETRON 4 MG PO TBDP: 4.0000 mg | ORAL_TABLET | Freq: Three times a day (TID) | ORAL | 0 refills | Status: AC | PRN

## 2023-07-12 NOTE — ED Notes (Signed)
 Lab called to come draw labs RN tried x2, small veins. They stated they will come draw labs.

## 2023-07-12 NOTE — ED Notes (Signed)
 Pt given taxi voucher, [employer] called for [employer] copied.

## 2023-07-12 NOTE — ED Notes (Signed)
 Pt states she does not want her spouse contacted or know that she is here, registration contacted

## 2023-07-12 NOTE — ED Provider Notes (Signed)
 [facility] Provider Note    Event Date/Time   First MD Initiated Contact with Patient 07/12/23 [phone]     (approximate)   History   Suicidal   HPI  Audrey [name] is a 48 y.o. female   Past medical history of PTSD, schizoaffective schizophrenia, chronic back pain with sciatica, depression who presents emergency department with symptoms of withdrawal.  She states that she has been without her narcotics for which she was on oral morphine and her pain prescriber retired and she has been without any pain medications for 10 d.  She denies drug use or alcohol use.  She has clonidine as well and she has run out of this for last 2 days as well.  She reports anxiety, chills, nausea.  These are consistent with narcotic withdrawal in the past.  External Medical Documents Reviewed: Gastroenterology notes from October 2024 for chronic nausea chronic constipation and chronic generalized whole body pain.      Physical Exam   Triage Vital Signs: ED Triage Vitals  Encounter Vitals Group     BP 07/12/23 0534 (!) 150/102     Systolic BP Percentile --      Diastolic BP Percentile --      Pulse Rate 07/12/23 0534 (!) 130     Resp 07/12/23 0534 19     Temp 07/12/23 0534 98.3 F (36.8 C)     Temp Source 07/12/23 0534 Oral     SpO2 07/12/23 0534 100 %     Weight 07/12/23 0529 250 lb (113.4 kg)     Height 07/12/23 0529 5\' 3"  (1.6 m)     Head Circumference --      Peak Flow --      Pain Score 07/12/23 0528 10     Pain Loc --      Pain Education --      Exclude from Growth Chart --     Most recent vital signs: Vitals:   07/12/23 0609 07/12/23 0638  BP: (!) 150/102 (!) 145/109  Pulse:  95  Resp:    Temp:    SpO2:  100%    General: Awake, no distress.  CV:  Good peripheral perfusion.  Resp:  Normal effort.  Abd:  No distention.  Other:  She is tachycardic and has high blood pressure.  She is tearful but cooperative and interactive with me.  She has  no tongue fasciculations or hand tremors.  She has clear lungs and a soft abdomen.   ED Results / Procedures / Treatments   Labs (all labs ordered are listed, but only abnormal results are displayed) Labs Reviewed - No data to display   EKG  ED ECG REPORT I, Pilar Jarvis, the attending physician, personally viewed and interpreted this ECG.   Date: 07/12/2023  EKG Time: 0615  Rate: 100  Rhythm: sinus tachycardia  Axis: nl  Intervals:nl  ST&T Change: no stemi   PROCEDURES:  Critical Care performed: No  Procedures   MEDICATIONS ORDERED IN ED: Medications  cloNIDine (CATAPRES) tablet 0.2 mg (0.2 mg Oral Given 07/12/23 0609)  ondansetron (ZOFRAN-ODT) disintegrating tablet 4 mg (4 mg Oral Given 07/12/23 0609)  hydrOXYzine (ATARAX) tablet 25 mg (25 mg Oral Given 07/12/23 9604)     IMPRESSION / MDM / ASSESSMENT AND PLAN / ED COURSE  I reviewed the triage vital signs and the nursing notes.  Patient's presentation is most consistent with acute presentation with potential threat to life or bodily function.  Differential diagnosis includes, but is not limited to, narcotic withdrawal, benzodiazepine withdrawal, anxiety   The patient is on the cardiac monitor to evaluate for evidence of arrhythmia and/or significant heart rate changes.  MDM:    She is anxious here with passive suicidal ideation in the setting of feeling frustrated with her pain management and lack of medications due to loss connection with her prescriber in [place] who recently retired.  I told her I would not prescribe her narcotics but can help with symptom management and will give Atarax, Zofran as well as prescriptions for the same.  Will give a dose of clonidine here as well.    SHe does not drink alcohol and has not had clonidine for 2 days I think she is outside of benzodiazepine like that and withdrawals.  Furthermore her PDMP review shows a 30-day supply of clonidine  filled in December.  She has passive suicidal ideation without a plan.  She does not want to see psychiatry.  I do not see a reason to hold her against her well.  Plan will be to medicate, obtain EKG given her tachycardia, and if normal plan will be for discharge with prescription for Zofran and Atarax.  She appears much more comfortable and tachycardia is improved.  I will make a referral for her for pain management clinic here in [place].       FINAL CLINICAL IMPRESSION(S) / ED DIAGNOSES   Final diagnoses:  Suicidal ideation  Narcotic withdrawal (HCC)     Rx / DC Orders   ED Discharge Orders          Ordered    Ambulatory referral to Pain Clinic        07/12/23 0611    hydrOXYzine (ATARAX) 25 MG tablet  3 times daily PRN        Pending    ondansetron (ZOFRAN-ODT) 4 MG disintegrating tablet  Every 8 hours PRN        Pending             Note:  This document was prepared using Dragon voice recognition software and may include unintentional dictation errors.    Buell Carmin, MD 07/12/23 [phone]
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
## 2023-07-12 NOTE — ED Notes (Signed)
Vol /psych consult pending 

## 2023-07-12 NOTE — ED Notes (Signed)
 Pt refuses to dress out. Has cell phone and clothing are only personal belongings. Primary RN aware. Pt wanded by security
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
## 2023-07-16 ENCOUNTER — Inpatient Hospital Stay
Admission: AD | Admit: 2023-07-16 | Discharge: 2023-07-22 | DRG: 897 | Disposition: A | Discharge status: Home / Self Care | Source: Intra-hospital | Attending: Psychiatry | Admitting: Psychiatry

## 2023-07-16 ENCOUNTER — Emergency Department
Admission: EM | Admit: 2023-07-16 | Discharge: 2023-07-16 | Disposition: A | Discharge status: Other Acute Inpatient Hospital | Attending: Emergency Medicine | Admitting: Emergency Medicine

## 2023-07-16 ENCOUNTER — Encounter: Payer: Self-pay | Admitting: Emergency Medicine

## 2023-07-16 ENCOUNTER — Other Ambulatory Visit: Payer: Self-pay

## 2023-07-16 DIAGNOSIS — F1123 Opioid dependence with withdrawal: Principal | ICD-10-CM | POA: Diagnosis present

## 2023-07-16 DIAGNOSIS — F419 Anxiety disorder, unspecified: Secondary | ICD-10-CM | POA: Diagnosis present

## 2023-07-16 DIAGNOSIS — G8929 Other chronic pain: Secondary | ICD-10-CM | POA: Diagnosis present

## 2023-07-16 DIAGNOSIS — Z9049 Acquired absence of other specified parts of digestive tract: Secondary | ICD-10-CM | POA: Diagnosis not present

## 2023-07-16 DIAGNOSIS — Z880 Allergy status to penicillin: Secondary | ICD-10-CM | POA: Diagnosis not present

## 2023-07-16 DIAGNOSIS — F1193 Opioid use, unspecified with withdrawal: Secondary | ICD-10-CM | POA: Diagnosis present

## 2023-07-16 DIAGNOSIS — Z9109 Other allergy status, other than to drugs and biological substances: Secondary | ICD-10-CM

## 2023-07-16 DIAGNOSIS — M51369 Other intervertebral disc degeneration, lumbar region without mention of lumbar back pain or lower extremity pain: Secondary | ICD-10-CM | POA: Diagnosis present

## 2023-07-16 DIAGNOSIS — F4310 Post-traumatic stress disorder, unspecified: Secondary | ICD-10-CM | POA: Diagnosis present

## 2023-07-16 DIAGNOSIS — Z91040 Latex allergy status: Secondary | ICD-10-CM | POA: Insufficient documentation

## 2023-07-16 DIAGNOSIS — Z818 Family history of other mental and behavioral disorders: Secondary | ICD-10-CM

## 2023-07-16 DIAGNOSIS — Z888 Allergy status to other drugs, medicaments and biological substances status: Secondary | ICD-10-CM

## 2023-07-16 DIAGNOSIS — G4700 Insomnia, unspecified: Secondary | ICD-10-CM | POA: Diagnosis present

## 2023-07-16 DIAGNOSIS — M1990 Unspecified osteoarthritis, unspecified site: Secondary | ICD-10-CM | POA: Diagnosis present

## 2023-07-16 DIAGNOSIS — F329 Major depressive disorder, single episode, unspecified: Secondary | ICD-10-CM | POA: Insufficient documentation

## 2023-07-16 DIAGNOSIS — F331 Major depressive disorder, recurrent, moderate: Secondary | ICD-10-CM | POA: Diagnosis not present

## 2023-07-16 DIAGNOSIS — R011 Cardiac murmur, unspecified: Secondary | ICD-10-CM | POA: Diagnosis present

## 2023-07-16 DIAGNOSIS — R45851 Suicidal ideations: Secondary | ICD-10-CM | POA: Diagnosis not present

## 2023-07-16 DIAGNOSIS — Z79899 Other long term (current) drug therapy: Secondary | ICD-10-CM

## 2023-07-16 LAB — COMPREHENSIVE METABOLIC PANEL WITH GFR
ALT: 24 U/L (ref 0–44)
AST: 34 U/L (ref 15–41)
Albumin: 3.7 g/dL (ref 3.5–5.0)
Alkaline Phosphatase: 73 U/L (ref 38–126)
Anion gap: 7 (ref 5–15)
BUN: 7 mg/dL (ref 6–20)
CO2: 23 mmol/L (ref 22–32)
Calcium: 9.2 mg/dL (ref 8.9–10.3)
Chloride: 109 mmol/L (ref 98–111)
Creatinine, Ser: 0.94 mg/dL (ref 0.44–1.00)
GFR, Estimated: >60 mL/min (ref >60)
Glucose, Bld: 92 mg/dL (ref 70–99)
Potassium: 3.3 mmol/L — ABNORMAL LOW (ref 3.5–5.1)
Sodium: 139 mmol/L (ref 135–145)
Total Bilirubin: 1.3 mg/dL — ABNORMAL HIGH (ref 0.0–1.2)
Total Protein: 6.5 g/dL (ref 6.5–8.1)

## 2023-07-16 LAB — URINE DRUG SCREEN, QUALITATIVE (ARMC ONLY)
Amphetamines, Ur Screen: NOT DETECTED
Barbiturates, Ur Screen: NOT DETECTED
Benzodiazepine, Ur Scrn: POSITIVE — AB
Cannabinoid 50 Ng, Ur ~~LOC~~: NOT DETECTED
Cocaine Metabolite,Ur ~~LOC~~: NOT DETECTED
MDMA (Ecstasy)Ur Screen: NOT DETECTED
Methadone Scn, Ur: NOT DETECTED
Opiate, Ur Screen: POSITIVE — AB
Phencyclidine (PCP) Ur S: NOT DETECTED
Tricyclic, Ur Screen: POSITIVE — AB

## 2023-07-16 LAB — SALICYLATE LEVEL: Salicylate Lvl: <7 mg/dL — ABNORMAL LOW (ref 7.0–30.0)

## 2023-07-16 LAB — CBC
HCT: 44.9 % (ref 36.0–46.0)
Hemoglobin: 14.5 g/dL (ref 12.0–15.0)
MCH: 29.5 pg (ref 26.0–34.0)
MCHC: 32.3 g/dL (ref 30.0–36.0)
MCV: 91.3 fL (ref 80.0–100.0)
Platelets: 244 10*3/uL (ref 150–400)
RBC: 4.92 MIL/uL (ref 3.87–5.11)
RDW: 13 % (ref 11.5–15.5)
WBC: 7.5 10*3/uL (ref 4.0–10.5)
nRBC: 0 % (ref 0.0–0.2)

## 2023-07-16 LAB — ETHANOL: Alcohol, Ethyl (B): <10 mg/dL (ref <10)

## 2023-07-16 LAB — ACETAMINOPHEN LEVEL: Acetaminophen (Tylenol), Serum: <10 ug/mL — ABNORMAL LOW (ref 10–30)

## 2023-07-16 MED ORDER — ACETAMINOPHEN 325 MG PO TABS
650.0000 mg | ORAL_TABLET | Freq: Once | ORAL | Status: AC
  Administered 2023-07-16: 650 mg via ORAL
  Filled 2023-07-16: qty 2

## 2023-07-16 MED ORDER — HALOPERIDOL LACTATE 5 MG/ML IJ SOLN: 5.0000 mg | Freq: Three times a day (TID) | INTRAMUSCULAR | Status: DC | PRN

## 2023-07-16 MED ORDER — TRAZODONE HCL 50 MG PO TABS
50.0000 mg | ORAL_TABLET | Freq: Every evening | ORAL | Status: DC | PRN
  Administered 2023-07-17 – 2023-07-19 (×3): 50 mg via ORAL
  Filled 2023-07-16 (×3): qty 1

## 2023-07-16 MED ORDER — HALOPERIDOL LACTATE 5 MG/ML IJ SOLN: 10.0000 mg | Freq: Three times a day (TID) | INTRAMUSCULAR | Status: DC | PRN

## 2023-07-16 MED ORDER — HALOPERIDOL 5 MG PO TABS: 5.0000 mg | ORAL_TABLET | Freq: Three times a day (TID) | ORAL | Status: DC | PRN

## 2023-07-16 MED ORDER — DIPHENHYDRAMINE HCL 50 MG/ML IJ SOLN
50.0000 mg | Freq: Once | INTRAMUSCULAR | Status: AC
  Administered 2023-07-16: 50 mg via INTRAMUSCULAR
  Filled 2023-07-16: qty 1

## 2023-07-16 MED ORDER — MAGNESIUM HYDROXIDE 400 MG/5ML PO SUSP: 30.0000 mL | Freq: Every day | ORAL | Status: DC | PRN

## 2023-07-16 MED ORDER — LORAZEPAM 2 MG/ML IJ SOLN: 2.0000 mg | Freq: Three times a day (TID) | INTRAMUSCULAR | Status: DC | PRN

## 2023-07-16 MED ORDER — DIPHENHYDRAMINE HCL 25 MG PO CAPS: 50.0000 mg | ORAL_CAPSULE | Freq: Three times a day (TID) | ORAL | Status: DC | PRN

## 2023-07-16 MED ORDER — HYDROXYZINE HCL 25 MG PO TABS
25.0000 mg | ORAL_TABLET | Freq: Three times a day (TID) | ORAL | Status: DC | PRN
  Administered 2023-07-17: 25 mg via ORAL
  Filled 2023-07-16: qty 1

## 2023-07-16 MED ORDER — HALOPERIDOL LACTATE 5 MG/ML IJ SOLN
5.0000 mg | Freq: Once | INTRAMUSCULAR | Status: AC
  Administered 2023-07-16: 5 mg via INTRAMUSCULAR
  Filled 2023-07-16: qty 1

## 2023-07-16 MED ORDER — ALUM & MAG HYDROXIDE-SIMETH 200-200-20 MG/5ML PO SUSP: 30.0000 mL | ORAL | Status: DC | PRN

## 2023-07-16 MED ORDER — LORAZEPAM 2 MG/ML IJ SOLN
2.0000 mg | Freq: Once | INTRAMUSCULAR | Status: AC
  Administered 2023-07-16: 2 mg via INTRAMUSCULAR
  Filled 2023-07-16: qty 1

## 2023-07-16 MED ORDER — POTASSIUM CHLORIDE 20 MEQ PO PACK
40.0000 meq | PACK | Freq: Once | ORAL | Status: AC
  Administered 2023-07-16: 40 meq via ORAL
  Filled 2023-07-16: qty 2

## 2023-07-16 MED ORDER — DIPHENHYDRAMINE HCL 50 MG/ML IJ SOLN: 50.0000 mg | Freq: Three times a day (TID) | INTRAMUSCULAR | Status: DC | PRN

## 2023-07-16 MED ORDER — ACETAMINOPHEN 325 MG PO TABS: 650.0000 mg | ORAL_TABLET | Freq: Four times a day (QID) | ORAL | Status: DC | PRN

## 2023-07-16 NOTE — ED Notes (Signed)
 Pt allowed this RN to draw blood with no issues. Given ice chips per request

## 2023-07-16 NOTE — ED Notes (Signed)
 Ice pack given to patient. Straps and plastic piece taken off of ice pack before given to pt.

## 2023-07-16 NOTE — ED Notes (Signed)
ivc/psych consult ordered/pending.Marland Kitchen
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
## 2023-07-16 NOTE — ED Notes (Signed)
 IVC INPATIENT PSYCH ADMIT

## 2023-07-16 NOTE — ED Notes (Signed)
 Pt Refused to take a shower

## 2023-07-16 NOTE — ED Notes (Signed)
 NP at the bedside for pt evaluation
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
## 2023-07-16 NOTE — ED Notes (Signed)
 RN called report to [employer].  Discussed pt Dx, Hx, Sx, current condition, labs.  All questions asked were answered.
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
## 2023-07-16 NOTE — ED Notes (Addendum)
 Pt ambulatory to restroom with steady gait.

## 2023-07-16 NOTE — ED Notes (Signed)
Lunch tray and beverage provided 

## 2023-07-16 NOTE — ED Notes (Signed)
 Pt dressed out into burgundy scrubs, all items placed into personal belongings bag, includes: - black shirt, green pants, white slippers, glasses and phone  Wanded by security

## 2023-07-17 ENCOUNTER — Encounter: Payer: Self-pay | Admitting: Psychiatry

## 2023-07-17 ENCOUNTER — Other Ambulatory Visit: Payer: Self-pay

## 2023-07-17 MED ORDER — CLONAZEPAM 0.5 MG PO TABS
0.5000 mg | ORAL_TABLET | Freq: Every day | ORAL | Status: DC
  Administered 2023-07-17 – 2023-07-22 (×5): 0.5 mg via ORAL
  Filled 2023-07-17 (×6): qty 1

## 2023-07-17 MED ORDER — ONDANSETRON 4 MG PO TBDP
4.0000 mg | ORAL_TABLET | Freq: Three times a day (TID) | ORAL | Status: DC | PRN
  Administered 2023-07-19: 4 mg via ORAL
  Filled 2023-07-17 (×2): qty 1

## 2023-07-17 MED ORDER — GABAPENTIN 400 MG PO CAPS
800.0000 mg | ORAL_CAPSULE | Freq: Three times a day (TID) | ORAL | Status: DC
  Administered 2023-07-17: 800 mg via ORAL
  Filled 2023-07-17: qty 2

## 2023-07-17 MED ORDER — ALBUTEROL SULFATE HFA 108 (90 BASE) MCG/ACT IN AERS
2.0000 | INHALATION_SPRAY | Freq: Four times a day (QID) | RESPIRATORY_TRACT | Status: DC | PRN
  Administered 2023-07-20: 2 via RESPIRATORY_TRACT
  Filled 2023-07-17 (×3): qty 6.7

## 2023-07-17 MED ORDER — METHOCARBAMOL 500 MG PO TABS
500.0000 mg | ORAL_TABLET | Freq: Three times a day (TID) | ORAL | Status: AC | PRN
  Administered 2023-07-17 – 2023-07-21 (×4): 500 mg via ORAL
  Filled 2023-07-17 (×4): qty 1

## 2023-07-17 MED ORDER — CLONIDINE HCL 0.1 MG PO TABS
0.1000 mg | ORAL_TABLET | Freq: Every day | ORAL | Status: DC
  Administered 2023-07-22: 0.1 mg via ORAL
  Filled 2023-07-17: qty 1

## 2023-07-17 MED ORDER — ONDANSETRON 4 MG PO TBDP
4.0000 mg | ORAL_TABLET | Freq: Once | ORAL | Status: AC
  Administered 2023-07-18: 4 mg via ORAL
  Filled 2023-07-17: qty 1

## 2023-07-17 MED ORDER — HYDROXYZINE HCL 25 MG PO TABS
25.0000 mg | ORAL_TABLET | Freq: Four times a day (QID) | ORAL | Status: DC | PRN
Start: 2023-07-17 — End: 2023-07-17

## 2023-07-17 MED ORDER — GABAPENTIN 400 MG PO CAPS
1000.0000 mg | ORAL_CAPSULE | Freq: Three times a day (TID) | ORAL | Status: DC
Start: 2023-07-17 — End: 2023-07-22
  Administered 2023-07-17 – 2023-07-22 (×16): 1000 mg via ORAL
  Filled 2023-07-17 (×16): qty 2

## 2023-07-17 MED ORDER — TRAMADOL HCL 50 MG PO TABS
50.0000 mg | ORAL_TABLET | Freq: Three times a day (TID) | ORAL | Status: DC | PRN
  Administered 2023-07-17 – 2023-07-21 (×6): 50 mg via ORAL
  Filled 2023-07-17 (×6): qty 1

## 2023-07-17 MED ORDER — HYDROXYZINE HCL 25 MG PO TABS
25.0000 mg | ORAL_TABLET | Freq: Three times a day (TID) | ORAL | Status: DC | PRN
  Administered 2023-07-19: 25 mg via ORAL
  Filled 2023-07-17: qty 1

## 2023-07-17 MED ORDER — TIZANIDINE HCL 4 MG PO TABS
4.0000 mg | ORAL_TABLET | Freq: Three times a day (TID) | ORAL | Status: DC
  Administered 2023-07-17 – 2023-07-22 (×16): 4 mg via ORAL
  Filled 2023-07-17 (×17): qty 1

## 2023-07-17 MED ORDER — DICYCLOMINE HCL 20 MG PO TABS: 20.0000 mg | ORAL_TABLET | Freq: Four times a day (QID) | ORAL | Status: AC | PRN

## 2023-07-17 MED ORDER — LOPERAMIDE HCL 2 MG PO CAPS: 2.0000 mg | ORAL_CAPSULE | ORAL | Status: AC | PRN

## 2023-07-17 MED ORDER — CLONIDINE HCL 0.1 MG PO TABS
0.1000 mg | ORAL_TABLET | ORAL | Status: AC
  Administered 2023-07-19 – 2023-07-21 (×4): 0.1 mg via ORAL
  Filled 2023-07-17 (×4): qty 1

## 2023-07-17 MED ORDER — BUSPIRONE HCL 5 MG PO TABS: 10.0000 mg | ORAL_TABLET | Freq: Every day | ORAL | Status: DC | PRN

## 2023-07-17 MED ORDER — NAPROXEN 500 MG PO TABS: 500.0000 mg | ORAL_TABLET | Freq: Two times a day (BID) | ORAL | Status: AC | PRN

## 2023-07-17 MED ORDER — VERAPAMIL HCL ER 240 MG PO TBCR
240.0000 mg | EXTENDED_RELEASE_TABLET | Freq: Every day | ORAL | Status: DC
  Administered 2023-07-17 – 2023-07-22 (×6): 240 mg via ORAL
  Filled 2023-07-17 (×6): qty 1

## 2023-07-17 MED ORDER — CLONIDINE HCL 0.1 MG PO TABS
0.1000 mg | ORAL_TABLET | Freq: Four times a day (QID) | ORAL | Status: AC
  Administered 2023-07-17 – 2023-07-19 (×10): 0.1 mg via ORAL
  Filled 2023-07-17 (×10): qty 1

## 2023-07-17 MED ORDER — ONDANSETRON 4 MG PO TBDP: 4.0000 mg | ORAL_TABLET | Freq: Four times a day (QID) | ORAL | Status: DC | PRN

## 2023-07-17 NOTE — Progress Notes (Signed)
 22 year caucasian female with history of MDD, and suicidal ideation. Upon admission patient  is alert and oriented x 4, affect is blunted, she was cooperative with the admission process. Patient appears anxious, apprehensive, sad  and withdrawn, she forwards very little information, thoughts are organized, mood is congruent with affect. she was sometimes tearful in the admission process. Patient was offered emotional support, 15 minutes safety checks maintained will continue to monitor.

## 2023-07-17 NOTE — Progress Notes (Signed)
   07/17/23 1000  Psych Admission Type (Psych Patients Only)  Admission Status Involuntary  Psychosocial Assessment  Patient Complaints Anxiety;Depression;Insomnia (patient states that she hasn't slept in four days; her anxiety and depression is high due to being in the hospital.)  Eye Contact Fair  Facial Expression Fixed smile;Pained (patient states that she has a lot of chronic pain)  Affect Anxious;Preoccupied  Speech Logical/coherent  Interaction Needy;Assertive (med-seeking due to opiate withdrawals)  Motor Activity Slow  Appearance/Hygiene In scrubs  Behavior Characteristics Cooperative;Appropriate to situation  Mood Anxious;Preoccupied  Aggressive Behavior  Effect No apparent injury  Thought Process  Coherency Circumstantial  Content Preoccupation  Delusions None reported or observed  Perception WDL  Hallucination None reported or observed  Judgment WDL  Confusion None  Danger to Self  Current suicidal ideation? Denies  Danger to Others  Danger to Others None reported or observed

## 2023-07-17 NOTE — Tx Team (Signed)
 Initial Treatment Plan 07/17/2023 3:37 AM Audrey [name] [id number]    PATIENT STRESSORS: Health problems   Medication change or noncompliance     PATIENT STRENGTHS: Motivation for treatment/growth  Supportive family/friends    PATIENT IDENTIFIED PROBLEMS: Depression     Suicidal Ideation                  DISCHARGE CRITERIA:  Improved stabilization in mood, thinking, and/or behavior Motivation to continue treatment in a less acute level of care  PRELIMINARY DISCHARGE PLAN: Outpatient therapy  PATIENT/FAMILY INVOLVEMENT: This treatment plan has been presented to and reviewed with the patient, Audrey [name],  The patient and family have been given the opportunity to ask questions and make suggestions.  Enes Wegener Abisola Breelynn Bankert, RN 07/17/2023, 3:37 AM

## 2023-07-17 NOTE — [facility] Suicide Risk Assessment (Signed)
 [facility] Admission Suicide Risk Assessment   Nursing information obtained from:  Patient Demographic factors:  Low socioeconomic status, Unemployed, Caucasian, Adolescent or young adult Current Mental Status:  Suicidal ideation indicated by patient Loss Factors:  Decline in physical health Historical Factors:  Impulsivity Risk Reduction Factors:  Living with another person, especially a relative, Positive social support  Total Time spent with patient: 45 minutes Principal Problem: MDD (major depressive disorder) Diagnosis:  Principal Problem:   MDD (major depressive disorder) Active Problems:   Opioid use with withdrawal (HCC)  Subjective Data: Patient is a 48 year old female who currently lives with her husband has been reporting to me that she was on morphine  30 mg p.o. twice daily and her psychiatrist resigned her provider resigned and she has not been on medication for a week reports significant challenges with withdrawal symptoms she is quite dysphoric wants immediate medications somewhat medication seeking at time  Continued Clinical Symptoms:  Alcohol Use Disorder Identification Test Final Score (AUDIT): 3 The "Alcohol Use Disorders Identification Test", Guidelines for Use in Primary Care, Second Edition.  World Science writer [facility]). Score between 0-7:  no or low risk or alcohol related problems. Score between 8-15:  moderate risk of alcohol related problems. Score between 16-19:  high risk of alcohol related problems. Score 20 or above:  warrants further diagnostic evaluation for alcohol dependence and treatment.   CLINICAL FACTORS:   Severe Anxiety and/or Agitation   Musculoskeletal: Strength & Muscle Tone: within normal limits Gait & Station: normal Patient leans: N/A  Psychiatric Specialty Exam:  Presentation  General Appearance:  Casual  Eye Contact: Fair  Speech: Pressured  Speech Volume: Normal  Handedness:No data recorded  Mood and Affect   Mood: Anxious  Affect: Appropriate   Thought Process  Thought Processes: Coherent  Descriptions of Associations:Intact  Orientation:Full (Time, Place and Person)  Thought Content:Logical  History of Schizophrenia/Schizoaffective disorder:No  Duration of Psychotic Symptoms:No data recorded Hallucinations:No data recorded Ideas of Reference:No data recorded Suicidal Thoughts:Suicidal Thoughts: No  Homicidal Thoughts:Homicidal Thoughts: No   Sensorium  Memory:No data recorded Judgment:No data recorded Insight: Fair   Art therapist  Concentration:No data recorded Attention Span: Poor  Recall: [employer] of Knowledge: Fair  Language: Fair   Psychomotor Activity  Psychomotor Activity: Psychomotor Activity: Normal   Assets  Assets: Communication Skills   Sleep  Sleep: Sleep: Fair    Physical Exam: Physical Exam ROS Blood pressure (!) 135/107, pulse (!) 129, temperature 98.6 F (37 C), temperature source Oral, resp. rate 20, height 5\' 2"  (1.575 m), weight 108.9 kg, SpO2 99%. Body mass index is 43.9 kg/m.   COGNITIVE FEATURES THAT CONTRIBUTE TO RISK:  Closed-mindedness    SUICIDE RISK:   Minimal: No identifiable suicidal ideation.  Patients presenting with no risk factors but with morbid ruminations; may be classified as minimal risk based on the severity of the depressive symptoms  PLAN OF CARE: Patient main concern at this time is opioid withdrawal and we are addressing with high dose of Neurontin  she is not an imminent risk to self or others main focuses on pain and opiate withdrawal.  I certify that inpatient services furnished can reasonably be expected to improve the patient's condition.   Lindajo Resides, MD 07/17/2023, 9:24 AM

## 2023-07-17 NOTE — Progress Notes (Signed)
   07/17/23 1600  Clinical Opiate Withdrawal Scale (COWS)  Resting Pulse Rate 2  Sweating 0  Restlessness 0  Pupil Size 0  Bone or Joint Aches 2  Runny Nose or Tearing 0  GI Upset 0  Tremor 2  Yawning 0  Anxiety or Irritability 0  Gooseflesh Skin 0  COWS Total Score 6

## 2023-07-17 NOTE — H&P (Addendum)
 I see that patient received her morphine  2 months ago and she has not been on morphine , will change Neurontin  to 1000 mg p.o. 3 times daily and monitor for any withdrawals via COWS     this is a 48 year old female who has significant history of opioid dependence and chronic pain issues she is focused on withdrawal symptoms because of recent history of not having access to morphine  she reports that she has been taking morphine  for a long time she has also taken Suboxone her last use of morphine  was couple of days ago.  Will treat her as opioid withdrawal/major depression.           Psychiatric Admission Assessment Adult  Patient Identification: Audrey [name] MRN:  [id number] Date of Evaluation:  07/17/2023 Chief Complaint:  MDD (major depressive disorder) [F32.9] Principal Diagnosis: MDD (major depressive disorder) Diagnosis:  Principal Problem:   MDD (major depressive disorder) Active Problems:   Opioid use with withdrawal (HCC)  History of Present Illness: Patient is a 48 year old female who currently lives with her husband has significant challenges with opioid dependence patient reports that she was on morphine  30 mg p.o. twice daily for significant amount of time about a week ago she reports that she ran out because she was only given 30-day supply with her psychiatrist or a provider who was retiring she is is significantly dysphoric and in active withdrawal very subjective presentation demanding for medications immediately patient is denying any symptoms of mania hypomania psychosis with Associated Signs/Symptoms: Depression Symptoms:  depressed mood, (Hypo) Manic Symptoms:   none Anxiety Symptoms:   none Psychotic Symptoms:   none PTSD Symptoms: NA Total Time spent with patient: 30 minutes  Past Psychiatric History: Multiple psychiatric hospitalizations in the context of pain issues  Is the patient at risk to self? No.  Has the patient been a risk to self in the past  6 months? Yes.    Has the patient been a risk to self within the distant past? Yes.    Is the patient a risk to others? No.  Has the patient been a risk to others in the past 6 months? No.  Has the patient been a risk to others within the distant past? No.   [place] Scale:  Flowsheet Row Admission (Current) from 07/16/2023 in [facility] INPATIENT BEHAVIORAL MEDICINE Most recent reading at 07/17/2023  3:13 AM ED from 07/16/2023 in [facility] Emergency Department at [facility] Most recent reading at 07/16/2023  2:21 PM ED from 05/11/2023 in [facility] Emergency Department at [facility] Most recent reading at 05/11/2023  5:53 AM  C-SSRS RISK CATEGORY High Risk High Risk No Risk        Prior Inpatient Therapy: Yes.   If yes, describe has admitted admitted 4 times before Prior Outpatient Therapy: Yes.   If yes, describe psychiatrist  Alcohol Screening: 1. How often do you have a drink containing alcohol?: Monthly or less 2. How many drinks containing alcohol do you have on a typical day when you are drinking?: 3 or 4 3. How often do you have six or more drinks on one occasion?: Less than monthly AUDIT-C Score: 3 4. How often during the last year have you found that you were not able to stop drinking once you had started?: Never 5. How often during the last year have you failed to do what was normally expected from you because of drinking?: Never 6. How often during the last year have you needed a first  drink in the morning to get yourself going after a heavy drinking session?: Never 7. How often during the last year have you had a feeling of guilt of remorse after drinking?: Never 8. How often during the last year have you been unable to remember what happened the night before because you had been drinking?: Never 9. Have you or someone else been injured as a result of your drinking?: No 10. Has a relative or friend or a doctor or another health worker been concerned about your drinking  or suggested you cut down?: No Alcohol Use Disorder Identification Test Final Score (AUDIT): 3 Alcohol Brief Interventions/Follow-up: Alcohol education/Brief advice Substance Abuse History in the last 12 months:  No.DENIES  Consequences of Substance Abuse: Negative Previous Psychotropic Medications: Yes  Psychological Evaluations: Yes  Past Medical History:  Past Medical History:  Diagnosis Date   Anxiety    Arthritis    DDD (degenerative disc disease), lumbar 12/10/2020   Depression    Heart murmur    Pseudotumor cerebri    PTSD (post-traumatic stress disorder)    Schizo affective schizophrenia (HCC)    Sciatica     Past Surgical History:  Procedure Laterality Date   ABDOMINAL HYSTERECTOMY     ABDOMINAL SURGERY     APPENDECTOMY     CARDIAC CATHETERIZATION     CHOLECYSTECTOMY     COLONOSCOPY WITH PROPOFOL  N/A 02/18/2023   Procedure: COLONOSCOPY WITH PROPOFOL ;  Surgeon: Marnee Sink, MD;  Location: [facility] ENDOSCOPY;  Service: Endoscopy;  Laterality: N/A;   ESOPHAGOGASTRODUODENOSCOPY (EGD) WITH PROPOFOL  N/A 02/18/2023   Procedure: ESOPHAGOGASTRODUODENOSCOPY (EGD) WITH PROPOFOL ;  Surgeon: Marnee Sink, MD;  Location: [facility] ENDOSCOPY;  Service: Endoscopy;  Laterality: N/A;   KNEE SURGERY     TONSILLECTOMY     TUBAL LIGATION     Family History:  Family History  Problem Relation Age of Onset   Bipolar disorder Mother    Schizophrenia Sister    Schizophrenia Sister    Anxiety disorder Other    Depression Other    Suicidality Neg Hx    Family Psychiatric  History: Denies any suicidal attempts Tobacco Screening:  Social History   Tobacco Use  Smoking Status Never  Smokeless Tobacco Never    BH Tobacco Counseling     Are you interested in Tobacco Cessation Medications?  No value filed. Counseled patient on smoking cessation:  No value filed. Reason Tobacco Screening Not Completed: No value filed.       Social History:  Social History   Substance and Sexual Activity   Alcohol Use Never     Social History   Substance and Sexual Activity  Drug Use Never    Additional Social History:                           Allergies:   Allergies  Allergen Reactions   Other Anaphylaxis and Swelling    Kdc:Red Dye+Yellow Dye+Nitroglycerin+Brilliant Blue Fcf   Penicillins Anaphylaxis, Rash and Hives    Tolerates ceftriaxone    Prochlorperazine Anaphylaxis and Nausea And Vomiting   Propoxyphene Nausea And Vomiting and Other (See Comments)   Acetazolamide  Other (See Comments)    Joint pain   Latex Hives and Swelling   Nitroglycerin Er Swelling and Other (See Comments)    Body swells, but no breathing issues   Lab Results:  Results for orders placed or performed during the hospital encounter of 07/16/23 (from the past 48 hours)  Comprehensive metabolic  panel     Status: Abnormal   Collection Time: 07/16/23  4:11 AM  Result Value Ref Range   Sodium 139 135 - 145 mmol/L   Potassium 3.3 (L) 3.5 - 5.1 mmol/L   Chloride 109 98 - 111 mmol/L   CO2 23 22 - 32 mmol/L   Glucose, Bld 92 70 - 99 mg/dL    Comment: Glucose reference range applies only to samples taken after fasting for at least 8 hours.   BUN 7 6 - 20 mg/dL   Creatinine, Ser 1.61 0.44 - 1.00 mg/dL   Calcium 9.2 8.9 - 09.6 mg/dL   Total Protein 6.5 6.5 - 8.1 g/dL   Albumin 3.7 3.5 - 5.0 g/dL   AST 34 15 - 41 U/L   ALT 24 0 - 44 U/L   Alkaline Phosphatase 73 38 - 126 U/L   Total Bilirubin 1.3 (H) 0.0 - 1.2 mg/dL   GFR, Estimated >04 >54 mL/min    Comment: (NOTE) Calculated using the CKD-EPI Creatinine Equation (2021)    Anion gap 7 5 - 15    Comment: Performed at [facility], [address] [place], [place] [address]  Ethanol     Status: None   Collection Time: 07/16/23  4:11 AM  Result Value Ref Range   Alcohol, Ethyl (B) <10 <10 mg/dL    Comment: (NOTE) For medical purposes only. Performed at [facility], [address] [place], [place] [address]    Salicylate level     Status: Abnormal   Collection Time: 07/16/23  4:11 AM  Result Value Ref Range   Salicylate Lvl <7.0 (L) 7.0 - 30.0 mg/dL    Comment: Performed at [facility], [address] [place], [place] [address]  Acetaminophen  level     Status: Abnormal   Collection Time: 07/16/23  4:11 AM  Result Value Ref Range   Acetaminophen  (Tylenol ), Serum <10 (L) 10 - 30 ug/mL    Comment: (NOTE) Therapeutic concentrations vary significantly. A range of 10-30 ug/mL  may be an effective concentration for many patients. However, some  are best treated at concentrations outside of this range. Acetaminophen  concentrations >150 ug/mL at 4 hours after ingestion  and >50 ug/mL at 12 hours after ingestion are often associated with  toxic reactions.  Performed at [facility], [address] [place], [place] [address]   cbc     Status: None   Collection Time: 07/16/23  4:11 AM  Result Value Ref Range   WBC 7.5 4.0 - 10.5 K/uL   RBC 4.92 3.87 - 5.11 MIL/uL   Hemoglobin 14.5 12.0 - 15.0 g/dL   HCT 57.8 46.9 - 62.9 %   MCV 91.3 80.0 - 100.0 fL   MCH 29.5 26.0 - 34.0 pg   MCHC 32.3 30.0 - 36.0 g/dL   RDW 52.8 41.3 - 24.4 %   Platelets 244 150 - 400 K/uL   nRBC 0.0 0.0 - 0.2 %    Comment: Performed at [facility], [address], [place], [place] [address]  Urine Drug Screen, Qualitative     Status: Abnormal   Collection Time: 07/16/23  4:11 AM  Result Value Ref Range   Tricyclic, Ur Screen POSITIVE (A) NONE DETECTED   Amphetamines, Ur Screen NONE DETECTED NONE DETECTED   MDMA (Ecstasy)Ur Screen NONE DETECTED NONE DETECTED   Cocaine Metabolite,Ur [place] NONE DETECTED NONE DETECTED   Opiate, Ur Screen POSITIVE (A) NONE DETECTED   Phencyclidine (PCP) Ur  S NONE DETECTED NONE DETECTED   Cannabinoid 50 Ng, Ur [place] NONE DETECTED NONE DETECTED   Barbiturates, Ur Screen NONE DETECTED NONE DETECTED   Benzodiazepine, Ur Scrn POSITIVE (A) NONE DETECTED   Methadone  Scn, Ur NONE DETECTED NONE DETECTED    Comment: (NOTE) Tricyclics + metabolites, urine    Cutoff 1000 ng/mL Amphetamines + metabolites, urine  Cutoff 1000 ng/mL MDMA (Ecstasy), urine              Cutoff 500 ng/mL Cocaine Metabolite, urine          Cutoff 300 ng/mL Opiate + metabolites, urine        Cutoff 300 ng/mL Phencyclidine (PCP), urine         Cutoff 25 ng/mL Cannabinoid, urine                 Cutoff 50 ng/mL Barbiturates + metabolites, urine  Cutoff 200 ng/mL Benzodiazepine, urine              Cutoff 200 ng/mL Methadone, urine                   Cutoff 300 ng/mL  The urine drug screen provides only a preliminary, unconfirmed analytical test result and should not be used for non-medical purposes. Clinical consideration and professional judgment should be applied to any positive drug screen result due to possible interfering substances. A more specific alternate chemical method must be used in order to obtain a confirmed analytical result. Gas chromatography / mass spectrometry (GC/MS) is the preferred confirm atory method. Performed at [facility], [address] [place], [place] [address]     Blood Alcohol level:  Lab Results  Component Value Date   [facility] <10 07/16/2023   ETH <10 06/22/2021    Metabolic Disorder Labs:  Lab Results  Component Value Date   HGBA1C 4.8 08/15/2021   MPG 91.06 08/15/2021   No results found for: "PROLACTIN" Lab Results  Component Value Date   CHOL 219 (H) 08/15/2021   TRIG 203 (H) 08/15/2021   HDL 48 08/15/2021   CHOLHDL 4.6 08/15/2021   VLDL 41 (H) 08/15/2021   LDLCALC 130 (H) 08/15/2021    Current Medications: Current Facility-Administered Medications  Medication Dose Route Frequency Provider Last Rate Last Admin   acetaminophen  (TYLENOL ) tablet 650 mg  650 mg Oral Q6H PRN Ajibola, Ene A, NP       alum & mag hydroxide-simeth (MAALOX/MYLANTA) 200-200-20 MG/5ML suspension 30 mL  30 mL Oral Q4H PRN Ajibola, Ene A, NP        busPIRone  (BUSPAR ) tablet 10-20 mg  10-20 mg Oral Daily PRN Bobbitt, Shalon E, NP       cloNIDine  (CATAPRES ) tablet 0.1 mg  0.1 mg Oral QID Bobbitt, Shalon E, NP   0.1 mg at 07/17/23 0715   Followed by   Cecily Cohen ON 07/19/2023] cloNIDine  (CATAPRES ) tablet 0.1 mg  0.1 mg Oral BH-qamhs Bobbitt, Shalon E, NP       Followed by   Cecily Cohen ON 07/22/2023] cloNIDine  (CATAPRES ) tablet 0.1 mg  0.1 mg Oral QAC breakfast Bobbitt, Shalon E, NP       dicyclomine  (BENTYL ) tablet 20 mg  20 mg Oral Q6H PRN Bobbitt, Shalon E, NP       haloperidol  (HALDOL ) tablet 5 mg  5 mg Oral TID PRN Ajibola, Ene A, NP       And   diphenhydrAMINE  (BENADRYL ) capsule 50 mg  50 mg Oral TID PRN Ajibola, Ene A,  NP       haloperidol  lactate (HALDOL ) injection 5 mg  5 mg Intramuscular TID PRN Ajibola, Ene A, NP       And   diphenhydrAMINE  (BENADRYL ) injection 50 mg  50 mg Intramuscular TID PRN Ajibola, Ene A, NP       And   LORazepam  (ATIVAN ) injection 2 mg  2 mg Intramuscular TID PRN Ajibola, Ene A, NP       haloperidol  lactate (HALDOL ) injection 10 mg  10 mg Intramuscular TID PRN Ajibola, Ene A, NP       And   diphenhydrAMINE  (BENADRYL ) injection 50 mg  50 mg Intramuscular TID PRN Ajibola, Ene A, NP       And   LORazepam  (ATIVAN ) injection 2 mg  2 mg Intramuscular TID PRN Ajibola, Ene A, NP       gabapentin  (NEURONTIN ) capsule 800 mg  800 mg Oral TID Bobbitt, Shalon E, NP   800 mg at 07/17/23 0908   hydrOXYzine  (ATARAX ) tablet 25 mg  25 mg Oral TID PRN Ajibola, Ene A, NP   25 mg at 07/17/23 1610   hydrOXYzine  (ATARAX ) tablet 25 mg  25 mg Oral Q6H PRN Bobbitt, Shalon E, NP       loperamide  (IMODIUM ) capsule 2-4 mg  2-4 mg Oral PRN Bobbitt, Shalon E, NP       magnesium  hydroxide (MILK OF MAGNESIA) suspension 30 mL  30 mL Oral Daily PRN Ajibola, Ene A, NP       methocarbamol  (ROBAXIN ) tablet 500 mg  500 mg Oral Q8H PRN Bobbitt, Shalon E, NP   500 mg at 07/17/23 9604   naproxen  (NAPROSYN ) tablet 500 mg  500 mg Oral BID PRN Bobbitt,  Shalon E, NP       ondansetron  (ZOFRAN -ODT) disintegrating tablet 4 mg  4 mg Oral Once Bobbitt, Shalon E, NP       ondansetron  (ZOFRAN -ODT) disintegrating tablet 4 mg  4 mg Oral Q6H PRN Bobbitt, Shalon E, NP       traMADol  (ULTRAM ) tablet 50 mg  50 mg Oral TID PRN Bobbitt, Shalon E, NP   50 mg at 07/17/23 0511   traZODone  (DESYREL ) tablet 50 mg  50 mg Oral QHS PRN Ajibola, Ene A, NP       verapamil  (CALAN -SR) CR tablet 240 mg  240 mg Oral Daily Bobbitt, Shalon E, NP   240 mg at 07/17/23 0908   PTA Medications: Medications Prior to Admission  Medication Sig Dispense Refill Last Dose/Taking   albuterol  (VENTOLIN  HFA) 108 (90 Base) MCG/ACT inhaler Inhale 2 puffs into the lungs every 6 (six) hours as needed for wheezing or shortness of breath. 8 g 2    busPIRone  (BUSPAR ) 10 MG tablet Take 10-20 mg by mouth daily as needed (Panic Attack).      clonazePAM  (KLONOPIN ) 0.5 MG tablet Take 0.5 mg by mouth daily.      clonazePAM  (KLONOPIN ) 1 MG tablet Take 1 mg by mouth 2 (two) times daily. (Patient not taking: Reported on 07/16/2023)      cloNIDine  (CATAPRES ) 0.1 MG tablet Take 0.1 mg by mouth 2 (two) times daily. (Patient not taking: Reported on 07/16/2023)      diazepam  (VALIUM ) 5 MG tablet Take 1 tablet (5 mg total) by mouth every 8 (eight) hours as needed for muscle spasms. (Patient not taking: Reported on 02/18/2023) 12 tablet 0    estradiol  (CLIMARA  - DOSED IN MG/24 HR) 0.05 mg/24hr patch Place 0.05 mg onto the skin  once a week.      gabapentin  (NEURONTIN ) 800 MG tablet Take 800 mg by mouth 3 (three) times daily.      hydrOXYzine  (ATARAX ) 25 MG tablet Take 1 tablet (25 mg total) by mouth 3 (three) times daily as needed. 30 tablet 0    mirtazapine  (REMERON  SOL-TAB) 15 MG disintegrating tablet Take 15 mg by mouth at bedtime. (Patient not taking: Reported on 07/16/2023)      morphine  (MS CONTIN ) 30 MG 12 hr tablet Take 30 mg by mouth 2 (two) times daily.      naloxone  (NARCAN ) nasal spray 4 mg/0.1 mL Place  1 spray into the nose once as needed (for accidental overdose).      naproxen  (NAPROSYN ) 500 MG tablet Take 1 tablet (500 mg total) by mouth 2 (two) times daily with a meal. (Patient not taking: Reported on 07/16/2023) 20 tablet 0    ondansetron  (ZOFRAN -ODT) 4 MG disintegrating tablet Take 1 tablet (4 mg total) by mouth every 8 (eight) hours as needed for nausea or vomiting. 20 tablet 0    promethazine  (PHENERGAN ) 12.5 MG tablet Take 1 tablet (12.5 mg total) by mouth every 6 (six) hours as needed for nausea or vomiting. (Patient not taking: Reported on 07/16/2023) 30 tablet 0    QUEtiapine  (SEROQUEL ) 100 MG tablet Take 100 mg by mouth 3 (three) times daily. (Patient not taking: Reported on 02/18/2023)      tiZANidine  (ZANAFLEX ) 4 MG tablet Take 4 mg by mouth 3 (three) times daily.      topiramate  (TOPAMAX ) 25 MG tablet Take 1 tablet (25 mg total) by mouth at bedtime. (Patient not taking: Reported on 07/16/2023) 30 tablet 1    traMADol  (ULTRAM ) 50 MG tablet Take 50 mg by mouth 3 (three) times daily as needed.      verapamil  (CALAN -SR) 240 MG CR tablet Take 240 mg by mouth daily.       Musculoskeletal: Strength & Muscle Tone: within normal limits Gait & Station: normal Patient leans: N/A            Psychiatric Specialty Exam:  Presentation  General Appearance:  Casual  Eye Contact: Fair  Speech: Pressured  Speech Volume: Normal  Handedness:No data recorded  Mood and Affect  Mood: Anxious  Affect: Appropriate   Thought Process  Thought Processes: Coherent  Duration of Psychotic Symptoms:N/A Past Diagnosis of Schizophrenia or Psychoactive disorder: No  Descriptions of Associations:Intact  Orientation:Full (Time, Place and Person)  Thought Content:Logical  Hallucinations:No data recorded Ideas of Reference:No data recorded Suicidal Thoughts:Suicidal Thoughts: No  Homicidal Thoughts:Homicidal Thoughts: No   Sensorium  Memory:No data recorded Judgment:No  data recorded Insight: Fair   Art therapist  Concentration:No data recorded Attention Span: Poor  Recall: [employer] of Knowledge: Fair  Language: Fair   Psychomotor Activity  Psychomotor Activity: Psychomotor Activity: Normal   Assets  Assets: Communication Skills   Sleep  Sleep: Sleep: Fair    Physical Exam: Physical Exam ROS Blood pressure (!) 135/107, pulse (!) 129, temperature 98.6 F (37 C), temperature source Oral, resp. rate 20, height 5\' 2"  (1.575 m), weight 108.9 kg, SpO2 99%. Body mass index is 43.9 kg/m.  Treatment Plan Summary: Daily contact with patient to assess and evaluate symptoms and progress in treatment  Observation Level/Precautions:  Detox  Laboratory:   Already ordered labs  Psychotherapy: Supportive psychotherapy  Medications: Will start her on Neurontin  but increase it to 1200 3 times daily  Consultations: None at this time  Discharge Concerns: Pain management continuation  Estimated LOS: 7 days  Other:     Physician Treatment Plan for Primary Diagnosis: MDD (major depressive disorder) opiate use disorder currently in withdrawal Long Term Goal(s): Improvement in symptoms so as ready for discharge  Short Term Goals: Ability to identify changes in lifestyle to reduce recurrence of condition will improve  Physician Treatment Plan for Secondary Diagnosis: Principal Problem:   MDD (major depressive disorder) Active Problems:   Opioid use with withdrawal (HCC)  Long Term Goal(s): Improvement in symptoms so as ready for discharge  Short Term Goals: Ability to identify changes in lifestyle to reduce recurrence of condition will improve  I certify that inpatient services furnished can reasonably be expected to improve the patient's condition.    Lindajo Resides, MD 4/19/20259:29 AM

## 2023-07-17 NOTE — Progress Notes (Signed)
   07/17/23 0800  Clinical Opiate Withdrawal Scale (COWS)  Resting Pulse Rate 4  Sweating 0  Restlessness 3  Pupil Size 0  Bone or Joint Aches 4  Runny Nose or Tearing 0  GI Upset 0  Tremor 1  Yawning 0  Anxiety or Irritability 2  Gooseflesh Skin 0  COWS Total Score 14

## 2023-07-17 NOTE — Plan of Care (Signed)

## 2023-07-17 NOTE — Group Note (Signed)
 Date:  07/17/2023 Time:  9:52 PM  Group Topic/Focus:  Wrap-Up Group:   The focus of this group is to help patients review their daily goal of treatment and discuss progress on daily workbooks.    Participation Level:  Did Not Attend   Audrey [name] 07/17/2023, 9:52 PM

## 2023-07-18 ENCOUNTER — Encounter: Payer: Self-pay | Admitting: Psychiatry

## 2023-07-18 NOTE — Plan of Care (Signed)

## 2023-07-18 NOTE — Plan of Care (Signed)
   Problem: Education: Goal: Knowledge of [facility] Education information/materials will improve Outcome: Progressing Goal: Emotional status will improve Outcome: Progressing Goal: Mental status will improve Outcome: Progressing Goal: Verbalization of understanding the information provided will improve Outcome: Progressing

## 2023-07-18 NOTE — Progress Notes (Signed)
   07/18/23 0900  Psych Admission Type (Psych Patients Only)  Admission Status Involuntary  Psychosocial Assessment  Patient Complaints Anxiety;Depression (patient states "it's ok", and that she is still a little anxious, "I think just being here" is why she's feeling this way.)  Eye Contact Fair  Facial Expression Fixed smile;Pained (patient states that she has a lot of chronic pain)  Affect Anxious;Preoccupied  Speech Logical/coherent  Interaction Needy;Assertive (med-seeking due to opiate withdrawals)  Motor Activity Slow  Appearance/Hygiene In scrubs  Behavior Characteristics Cooperative;Appropriate to situation  Mood Pleasant (patient states "everyday it gets a little bit better".)  Aggressive Behavior  Effect No apparent injury  Thought Process  Coherency WDL  Content WDL  Delusions None reported or observed  Perception WDL  Hallucination None reported or observed  Judgment WDL  Confusion None  Danger to Self  Current suicidal ideation? Denies  Agreement Not to Harm Self Yes  Description of Agreement Verbal  Danger to Others  Danger to Others None reported or observed

## 2023-07-18 NOTE — Group Note (Signed)
 Date:  07/18/2023 Time:  9:37 PM  Group Topic/Focus:  Wrap-Up Group:   The focus of this group is to help patients review their daily goal of treatment and discuss progress on daily workbooks.    Participation Level:  Active  Participation Quality:  Appropriate  Affect:  Appropriate  Cognitive:  Alert and Appropriate  Insight: Appropriate  Engagement in Group:  Engaged  Modes of Intervention:  Activity and Socialization  Additional Comments:     Maglione,Arianie Couse E 07/18/2023, 9:37 PM

## 2023-07-18 NOTE — Progress Notes (Signed)
   07/18/23 1600  Clinical Opiate Withdrawal Scale (COWS)  Resting Pulse Rate 0  Sweating 0  Restlessness 0  Pupil Size 0  Bone or Joint Aches 1  Runny Nose or Tearing 0  GI Upset 2  Tremor 2  Yawning 0  Anxiety or Irritability 1  Gooseflesh Skin 0  COWS Total Score 6
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
## 2023-07-18 NOTE — Progress Notes (Signed)
 Pt in bed, mood depressed, affect flat, withdrawn and isolative.  No peers/staff interaction.  Denied SI/HI and AVH.    07/17/23 2100  Psych Admission Type (Psych Patients Only)  Admission Status Involuntary  Psychosocial Assessment  Patient Complaints Anxiety;Depression  Eye Contact Fair  Facial Expression Flat  Affect Anxious  Speech Logical/coherent  Interaction Assertive  Motor Activity Slow  Appearance/Hygiene In scrubs  Behavior Characteristics Cooperative  Mood Anxious  Aggressive Behavior  Effect No apparent injury  Thought Process  Coherency Circumstantial  Content Preoccupation  Delusions None reported or observed  Perception WDL  Hallucination None reported or observed  Judgment WDL  Confusion None  Danger to Self  Current suicidal ideation? Denies  Danger to Others  Danger to Others None reported or observed

## 2023-07-18 NOTE — Progress Notes (Signed)
 PRN Zofran  was given, per patient request, as well as Gatorade. Patient encouraged to increase fluid intake and to notify staff if symptoms persist and/or worsen. Patient verbalized understanding and went back to her room to lay down.   07/18/23 1649  Vital Signs  Pulse Rate 73  Pulse Rate Source Monitor  Resp 16  BP (!) 90/44  BP Location Left Arm  BP Method Automatic  Patient Position (if appropriate) Sitting  Oxygen  Therapy  SpO2 98 %  O2 Device Room Air  Complaints & Interventions  Complains of Nausea /  Vomiting;Other (Comment) (dizziness)  Interventions Medication (see MAR)  Nausea relieved by Antiemetic

## 2023-07-18 NOTE — Progress Notes (Signed)
 [facility] Progress Note  07/18/2023 6:33 AM Audrey [name]  MRN:  [id number] Subjective:  Patient reports that she is feeling much better today she has not having that much withdrawal symptoms she attributes Neurontin  helping.  She denies any symptoms of mania hypomania psychosis denies any suicidal homicidal thoughts no overt withdrawal symptoms noted as such. Principal Problem: MDD (major depressive disorder) Diagnosis: Principal Problem:   MDD (major depressive disorder) Active Problems:   Opioid use with withdrawal (HCC)  Total Time spent with patient: 20 minutes  Past Psychiatric History:   History of anxiety and opiate dependence  Past Medical History:  Past Medical History:  Diagnosis Date   Anxiety    Arthritis    DDD (degenerative disc disease), lumbar 12/10/2020   Depression    Heart murmur    Pseudotumor cerebri    PTSD (post-traumatic stress disorder)         Sciatica     Past Surgical History:  Procedure Laterality Date   ABDOMINAL HYSTERECTOMY     ABDOMINAL SURGERY     APPENDECTOMY     CARDIAC CATHETERIZATION     CHOLECYSTECTOMY     COLONOSCOPY WITH PROPOFOL  N/A 02/18/2023   Procedure: COLONOSCOPY WITH PROPOFOL ;  Surgeon: Marnee Sink, MD;  Location: [facility] ENDOSCOPY;  Service: Endoscopy;  Laterality: N/A;   ESOPHAGOGASTRODUODENOSCOPY (EGD) WITH PROPOFOL  N/A 02/18/2023   Procedure: ESOPHAGOGASTRODUODENOSCOPY (EGD) WITH PROPOFOL ;  Surgeon: Marnee Sink, MD;  Location: [facility] ENDOSCOPY;  Service: Endoscopy;  Laterality: N/A;   KNEE SURGERY     TONSILLECTOMY     TUBAL LIGATION     Family History:  Family History  Problem Relation Age of Onset   Bipolar disorder Mother    Schizophrenia Sister    Schizophrenia Sister    Anxiety disorder Other    Depression Other    Suicidality Neg Hx    Family Psychiatric  History:  Social History:  Social History   Substance and Sexual Activity  Alcohol Use Never     Social History   Substance and Sexual Activity   Drug Use Never    Social History   Socioeconomic History   Marital status: Married    Spouse name: Not on file   Number of children: Not on file   Years of education: Not on file   Highest education level: Not on file  Occupational History   Not on file  Tobacco Use   Smoking status: Never   Smokeless tobacco: Never  Vaping Use   Vaping status: Never Used  Substance and Sexual Activity   Alcohol use: Never   Drug use: Never   Sexual activity: Yes  Other Topics Concern   Not on file  Social History Narrative   Not on file   Social Drivers of Health   Financial Resource Strain: Low Risk  (11/10/2022)   Received from [facility]   Overall Financial Resource Strain (CARDIA)    Difficulty of Paying Living Expenses: Not hard at all  Food Insecurity: No Food Insecurity (07/17/2023)   Hunger Vital Sign    Worried About Running Out of Food in the Last Year: Never true    Ran Out of Food in the Last Year: Never true  Transportation Needs: No Transportation Needs (07/17/2023)   PRAPARE - Administrator, Civil Service (Medical): No    Lack of Transportation (Non-Medical): No  Physical Activity: Sufficiently Active (11/10/2022)   Received from [facility]   Exercise Vital Sign  Days of Exercise per Week: 5 days    Minutes of Exercise per Session: 30 min  Stress: Stress Concern Present (11/10/2022)   Received from [facility] - Occupational Stress Questionnaire    Feeling of Stress : Very much  Social Connections: Unknown (07/17/2023)   Social Connection and Isolation Panel [NHANES]    Frequency of Communication with Friends and Family: Three times a week    Frequency of Social Gatherings with Friends and Family: Never    Attends Religious Services: 1 to 4 times per year    Active Member of [employer] or Organizations: Yes    Attends Banker Meetings: Never    Marital Status: Not on file   Additional Social  History:                         Sleep: Good  Appetite:  Good  Current Medications: Current Facility-Administered Medications  Medication Dose Route Frequency Provider Last Rate Last Admin   acetaminophen  (TYLENOL ) tablet 650 mg  650 mg Oral Q6H PRN Ajibola, Ene A, NP       albuterol  (VENTOLIN  HFA) 108 (90 Base) MCG/ACT inhaler 2 puff  2 puff Inhalation Q6H PRN Ilah Boule R, MD       alum & mag hydroxide-simeth (MAALOX/MYLANTA) 200-200-20 MG/5ML suspension 30 mL  30 mL Oral Q4H PRN Ajibola, Ene A, NP       busPIRone  (BUSPAR ) tablet 10-20 mg  10-20 mg Oral Daily PRN Bobbitt, Shalon E, NP       clonazePAM  (KLONOPIN ) tablet 0.5 mg  0.5 mg Oral Daily Kiptyn Rafuse R, MD   0.5 mg at 07/17/23 1828   cloNIDine  (CATAPRES ) tablet 0.1 mg  0.1 mg Oral QID Bobbitt, Shalon E, NP   0.1 mg at 07/17/23 2311   Followed by   Cecily Cohen ON 07/19/2023] cloNIDine  (CATAPRES ) tablet 0.1 mg  0.1 mg Oral BH-qamhs Bobbitt, Shalon E, NP       Followed by   Cecily Cohen ON 07/22/2023] cloNIDine  (CATAPRES ) tablet 0.1 mg  0.1 mg Oral QAC breakfast Bobbitt, Shalon E, NP       dicyclomine  (BENTYL ) tablet 20 mg  20 mg Oral Q6H PRN Bobbitt, Shalon E, NP       haloperidol  (HALDOL ) tablet 5 mg  5 mg Oral TID PRN Ajibola, Ene A, NP       And   diphenhydrAMINE  (BENADRYL ) capsule 50 mg  50 mg Oral TID PRN Ajibola, Ene A, NP       haloperidol  lactate (HALDOL ) injection 5 mg  5 mg Intramuscular TID PRN Ajibola, Ene A, NP       And   diphenhydrAMINE  (BENADRYL ) injection 50 mg  50 mg Intramuscular TID PRN Ajibola, Ene A, NP       And   LORazepam  (ATIVAN ) injection 2 mg  2 mg Intramuscular TID PRN Ajibola, Ene A, NP       haloperidol  lactate (HALDOL ) injection 10 mg  10 mg Intramuscular TID PRN Ajibola, Ene A, NP       And   diphenhydrAMINE  (BENADRYL ) injection 50 mg  50 mg Intramuscular TID PRN Ajibola, Ene A, NP       And   LORazepam  (ATIVAN ) injection 2 mg  2 mg Intramuscular TID PRN Ajibola, Ene A, NP        gabapentin  (NEURONTIN ) capsule 1,000 mg  1,000 mg Oral TID Montzerrat Brunell R, MD   1,000  mg at 07/17/23 1828   hydrOXYzine  (ATARAX ) tablet 25 mg  25 mg Oral TID PRN Latania Bascomb R, MD       loperamide  (IMODIUM ) capsule 2-4 mg  2-4 mg Oral PRN Bobbitt, Shalon E, NP       magnesium  hydroxide (MILK OF MAGNESIA) suspension 30 mL  30 mL Oral Daily PRN Ajibola, Ene A, NP       methocarbamol  (ROBAXIN ) tablet 500 mg  500 mg Oral Q8H PRN Bobbitt, Shalon E, NP   500 mg at 07/17/23 4782   naproxen  (NAPROSYN ) tablet 500 mg  500 mg Oral BID PRN Bobbitt, Shalon E, NP       ondansetron  (ZOFRAN -ODT) disintegrating tablet 4 mg  4 mg Oral Once Bobbitt, Shalon E, NP       ondansetron  (ZOFRAN -ODT) disintegrating tablet 4 mg  4 mg Oral Q8H PRN Peyton Spengler R, MD       tiZANidine  (ZANAFLEX ) tablet 4 mg  4 mg Oral TID Simran Bomkamp R, MD   4 mg at 07/17/23 1828   traMADol  (ULTRAM ) tablet 50 mg  50 mg Oral TID PRN Bobbitt, Shalon E, NP   50 mg at 07/17/23 0511   traZODone  (DESYREL ) tablet 50 mg  50 mg Oral QHS PRN Ajibola, Ene A, NP   50 mg at 07/17/23 2311   verapamil  (CALAN -SR) CR tablet 240 mg  240 mg Oral Daily Bobbitt, Shalon E, NP   240 mg at 07/17/23 0908    Lab Results: No results found for this or any previous visit (from the past 48 hours).  Blood Alcohol level:  Lab Results  Component Value Date   ETH <10 07/16/2023   ETH <10 06/22/2021    Metabolic Disorder Labs: Lab Results  Component Value Date   HGBA1C 4.8 08/15/2021   MPG 91.06 08/15/2021   No results found for: "PROLACTIN" Lab Results  Component Value Date   CHOL 219 (H) 08/15/2021   TRIG 203 (H) 08/15/2021   HDL 48 08/15/2021   CHOLHDL 4.6 08/15/2021   VLDL 41 (H) 08/15/2021   LDLCALC 130 (H) 08/15/2021    Physical Findings: AIMS: Facial and Oral Movements Muscles of Facial Expression: None Lips and Perioral Area: None Jaw: None Tongue: None,Extremity Movements Upper (arms, wrists, hands, fingers): None Lower (legs,  knees, ankles, toes): None, Trunk Movements Neck, shoulders, hips: None, Global Judgements Severity of abnormal movements overall : None Incapacitation due to abnormal movements: None Patient's awareness of abnormal movements: No Awareness, Dental Status Current problems with teeth and/or dentures?: No Does patient usually wear dentures?: No Edentia?: No  CIWA:    COWS:  COWS Total Score: 3  Musculoskeletal: Strength & Muscle Tone: within normal limits Gait & Station: normal Patient leans: N/A  Psychiatric Specialty Exam:  Presentation  General Appearance:  Casual  Eye Contact: Fair  Speech: Pressured  Speech Volume: Normal  Handedness:No data recorded  Mood and Affect  Mood: Anxious  Affect: Appropriate   Thought Process  Thought Processes: Coherent  Descriptions of Associations:Intact  Orientation:Full (Time, Place and Person)  Thought Content:Logical  History of Schizophrenia/Schizoaffective disorder:No  Duration of Psychotic Symptoms:No data recorded Hallucinations:No data recorded Ideas of Reference:No data recorded Suicidal Thoughts:Suicidal Thoughts: No  Homicidal Thoughts:Homicidal Thoughts: No   Sensorium  Memory:No data recorded Judgment:No data recorded Insight: Fair   Art therapist  Concentration:No data recorded Attention Span: Poor  Recall: [employer] of Knowledge: Fair  Language: Fair   Psychomotor Activity  Psychomotor Activity: Psychomotor Activity:  Normal   Assets  Assets: Communication Skills   Sleep  Sleep: Sleep: Fair    Physical Exam: Physical Exam ROS Blood pressure (!) 148/62, pulse 93, temperature 97.8 F (36.6 C), temperature source Oral, resp. rate 19, height 5\' 2"  (1.575 m), weight 108.9 kg, SpO2 99%. Body mass index is 43.9 kg/m.   Treatment Plan Summary: Daily contact with patient to assess and evaluate symptoms and progress in treatment  Lindajo Resides, MD 07/18/2023,  6:33 AM

## 2023-07-18 NOTE — Progress Notes (Signed)
   07/18/23 0800  Clinical Opiate Withdrawal Scale (COWS)  Resting Pulse Rate 1  Sweating 0  Restlessness 0  Pupil Size 0  Bone or Joint Aches 2  Runny Nose or Tearing 0  GI Upset 0  Tremor 2  Yawning 0  Anxiety or Irritability 1  Gooseflesh Skin 0  COWS Total Score 6

## 2023-07-19 DIAGNOSIS — F331 Major depressive disorder, recurrent, moderate: Secondary | ICD-10-CM | POA: Diagnosis not present

## 2023-07-19 NOTE — Group Note (Signed)
 Date:  07/19/2023 Time:  4:49 PM  Group Topic/Focus:  Building Self Esteem:   The Focus of this group is helping patients become aware of the effects of self-esteem on their lives, the things they and others do that enhance or undermine their self-esteem, seeing the relationship between their level of self-esteem and the choices they make and learning ways to enhance self-esteem.    Participation Level:  Did Not Attend   Audrey [name] Audrey [name] 07/19/2023, 4:49 PM

## 2023-07-19 NOTE — Group Note (Signed)
 [facility] LCSW Group Therapy Note    Group Date: 07/19/2023 Start Time: 1300 End Time: 1400  Type of Therapy and Topic:  Group Therapy:  Overcoming Obstacles  Participation Level:  [facility] PARTICIPATION LEVEL: Did Not Attend   Description of Group:   In this group patients will be encouraged to explore what they see as obstacles to their own wellness and recovery. They will be guided to discuss their thoughts, feelings, and behaviors related to these obstacles. The group will process together ways to cope with barriers, with attention given to specific choices patients can make. Each patient will be challenged to identify changes they are motivated to make in order to overcome their obstacles. This group will be process-oriented, with patients participating in exploration of their own experiences as well as giving and receiving support and challenge from other group members.  Therapeutic Goals: 1. Patient will identify personal and current obstacles as they relate to admission. 2. Patient will identify barriers that currently interfere with their wellness or overcoming obstacles.  3. Patient will identify feelings, thought process and behaviors related to these barriers. 4. Patient will identify two changes they are willing to make to overcome these obstacles:    Summary of Patient Progress Patient did not attend group.   Therapeutic Modalities:   Cognitive Behavioral Therapy Solution Focused Therapy Motivational Interviewing Relapse Prevention Therapy   Randolm Butte, LCSW

## 2023-07-19 NOTE — Progress Notes (Signed)
 [facility] MD Progress Note  07/19/2023 9:21 AM Audrey [name]  MRN:  [id number] Subjective:  Patient reports that she is feeling much better today she has not having that much withdrawal symptoms she attributes Neurontin  helping.  She denies any symptoms of mania hypomania psychosis denies any suicidal homicidal thoughts no overt withdrawal symptoms noted as such. Principal Problem: MDD (major depressive disorder) Diagnosis: Principal Problem:   MDD (major depressive disorder) Active Problems:   Opioid use with withdrawal (HCC)  Total Time spent with patient: 20 minutes  Past Psychiatric History:   History of anxiety and opiate dependence  Past Medical History:  Past Medical History:  Diagnosis Date   Anxiety    Arthritis    DDD (degenerative disc disease), lumbar 12/10/2020   Depression    Heart murmur    Pseudotumor cerebri    PTSD (post-traumatic stress disorder)         Sciatica     Past Surgical History:  Procedure Laterality Date   ABDOMINAL HYSTERECTOMY     ABDOMINAL SURGERY     APPENDECTOMY     CARDIAC CATHETERIZATION     CHOLECYSTECTOMY     COLONOSCOPY WITH PROPOFOL  N/A 02/18/2023   Procedure: COLONOSCOPY WITH PROPOFOL ;  Surgeon: Marnee Sink, MD;  Location: [facility] ENDOSCOPY;  Service: Endoscopy;  Laterality: N/A;   ESOPHAGOGASTRODUODENOSCOPY (EGD) WITH PROPOFOL  N/A 02/18/2023   Procedure: ESOPHAGOGASTRODUODENOSCOPY (EGD) WITH PROPOFOL ;  Surgeon: Marnee Sink, MD;  Location: [facility] ENDOSCOPY;  Service: Endoscopy;  Laterality: N/A;   KNEE SURGERY     TONSILLECTOMY     TUBAL LIGATION     Family History:  Family History  Problem Relation Age of Onset   Bipolar disorder Mother    Schizophrenia Sister    Schizophrenia Sister    Anxiety disorder Other    Depression Other    Suicidality Neg Hx    Family Psychiatric  History:  Social History:  Social History   Substance and Sexual Activity  Alcohol Use Never     Social History   Substance and Sexual Activity   Drug Use Never    Social History   Socioeconomic History   Marital status: Married    Spouse name: Not on file   Number of children: Not on file   Years of education: Not on file   Highest education level: Not on file  Occupational History   Not on file  Tobacco Use   Smoking status: Never   Smokeless tobacco: Never  Vaping Use   Vaping status: Never Used  Substance and Sexual Activity   Alcohol use: Never   Drug use: Never   Sexual activity: Yes  Other Topics Concern   Not on file  Social History Narrative   Not on file   Social Drivers of Health   Financial Resource Strain: Low Risk  (11/10/2022)   Received from [facility]   Overall Financial Resource Strain (CARDIA)    Difficulty of Paying Living Expenses: Not hard at all  Food Insecurity: No Food Insecurity (07/17/2023)   Hunger Vital Sign    Worried About Running Out of Food in the Last Year: Never true    Ran Out of Food in the Last Year: Never true  Transportation Needs: No Transportation Needs (07/17/2023)   PRAPARE - Administrator, Civil Service (Medical): No    Lack of Transportation (Non-Medical): No  Physical Activity: Sufficiently Active (11/10/2022)   Received from [facility]   Exercise Vital Sign  Days of Exercise per Week: 5 days    Minutes of Exercise per Session: 30 min  Stress: Stress Concern Present (11/10/2022)   Received from [facility] - Occupational Stress Questionnaire    Feeling of Stress : Very much  Social Connections: Unknown (07/17/2023)   Social Connection and Isolation Panel [NHANES]    Frequency of Communication with Friends and Family: Three times a week    Frequency of Social Gatherings with Friends and Family: Never    Attends Religious Services: 1 to 4 times per year    Active Member of [employer] or Organizations: Yes    Attends Banker Meetings: Never    Marital Status: Not on file   Additional Social  History:                         Sleep: Good  Appetite:  Good  Current Medications: Current Facility-Administered Medications  Medication Dose Route Frequency Provider Last Rate Last Admin   acetaminophen  (TYLENOL ) tablet 650 mg  650 mg Oral Q6H PRN Ajibola, Ene A, NP       albuterol  (VENTOLIN  HFA) 108 (90 Base) MCG/ACT inhaler 2 puff  2 puff Inhalation Q6H PRN Madaram, Kondal R, MD       alum & mag hydroxide-simeth (MAALOX/MYLANTA) 200-200-20 MG/5ML suspension 30 mL  30 mL Oral Q4H PRN Ajibola, Ene A, NP       busPIRone  (BUSPAR ) tablet 10-20 mg  10-20 mg Oral Daily PRN Bobbitt, Shalon E, NP       clonazePAM  (KLONOPIN ) tablet 0.5 mg  0.5 mg Oral Daily Madaram, Kondal R, MD   0.5 mg at 07/19/23 9528   cloNIDine  (CATAPRES ) tablet 0.1 mg  0.1 mg Oral QID Bobbitt, Shalon E, NP   0.1 mg at 07/19/23 4132   Followed by   cloNIDine  (CATAPRES ) tablet 0.1 mg  0.1 mg Oral BH-qamhs Bobbitt, Shalon E, NP       Followed by   Cecily Cohen ON 07/22/2023] cloNIDine  (CATAPRES ) tablet 0.1 mg  0.1 mg Oral QAC breakfast Bobbitt, Shalon E, NP       dicyclomine  (BENTYL ) tablet 20 mg  20 mg Oral Q6H PRN Bobbitt, Shalon E, NP       haloperidol  (HALDOL ) tablet 5 mg  5 mg Oral TID PRN Ajibola, Ene A, NP       And   diphenhydrAMINE  (BENADRYL ) capsule 50 mg  50 mg Oral TID PRN Ajibola, Ene A, NP       haloperidol  lactate (HALDOL ) injection 5 mg  5 mg Intramuscular TID PRN Ajibola, Ene A, NP       And   diphenhydrAMINE  (BENADRYL ) injection 50 mg  50 mg Intramuscular TID PRN Ajibola, Ene A, NP       And   LORazepam  (ATIVAN ) injection 2 mg  2 mg Intramuscular TID PRN Ajibola, Ene A, NP       haloperidol  lactate (HALDOL ) injection 10 mg  10 mg Intramuscular TID PRN Ajibola, Ene A, NP       And   diphenhydrAMINE  (BENADRYL ) injection 50 mg  50 mg Intramuscular TID PRN Ajibola, Ene A, NP       And   LORazepam  (ATIVAN ) injection 2 mg  2 mg Intramuscular TID PRN Ajibola, Ene A, NP       gabapentin  (NEURONTIN ) capsule  1,000 mg  1,000 mg Oral TID Madaram, Kondal R, MD   1,000 mg at 07/19/23  1610   hydrOXYzine  (ATARAX ) tablet 25 mg  25 mg Oral TID PRN Madaram, Kondal R, MD       loperamide  (IMODIUM ) capsule 2-4 mg  2-4 mg Oral PRN Bobbitt, Shalon E, NP       magnesium  hydroxide (MILK OF MAGNESIA) suspension 30 mL  30 mL Oral Daily PRN Ajibola, Ene A, NP       methocarbamol  (ROBAXIN ) tablet 500 mg  500 mg Oral Q8H PRN Bobbitt, Shalon E, NP   500 mg at 07/18/23 1235   naproxen  (NAPROSYN ) tablet 500 mg  500 mg Oral BID PRN Bobbitt, Shalon E, NP       ondansetron  (ZOFRAN -ODT) disintegrating tablet 4 mg  4 mg Oral Q8H PRN Madaram, Kondal R, MD       tiZANidine  (ZANAFLEX ) tablet 4 mg  4 mg Oral TID Madaram, Kondal R, MD   4 mg at 07/19/23 0830   traMADol  (ULTRAM ) tablet 50 mg  50 mg Oral TID PRN Bobbitt, Shalon E, NP   50 mg at 07/18/23 2149   traZODone  (DESYREL ) tablet 50 mg  50 mg Oral QHS PRN Ajibola, Ene A, NP   50 mg at 07/18/23 2149   verapamil  (CALAN -SR) CR tablet 240 mg  240 mg Oral Daily Bobbitt, Shalon E, NP   240 mg at 07/19/23 0830    Lab Results: No results found for this or any previous visit (from the past 48 hours).  Blood Alcohol level:  Lab Results  Component Value Date   ETH <10 07/16/2023   ETH <10 06/22/2021    Metabolic Disorder Labs: Lab Results  Component Value Date   HGBA1C 4.8 08/15/2021   MPG 91.06 08/15/2021   No results found for: "PROLACTIN" Lab Results  Component Value Date   CHOL 219 (H) 08/15/2021   TRIG 203 (H) 08/15/2021   HDL 48 08/15/2021   CHOLHDL 4.6 08/15/2021   VLDL 41 (H) 08/15/2021   LDLCALC 130 (H) 08/15/2021    Physical Findings: AIMS: Facial and Oral Movements Muscles of Facial Expression: None Lips and Perioral Area: None Jaw: None Tongue: None,Extremity Movements Upper (arms, wrists, hands, fingers): None Lower (legs, knees, ankles, toes): None, Trunk Movements Neck, shoulders, hips: None, Global Judgements Severity of abnormal movements  overall : None Incapacitation due to abnormal movements: None Patient's awareness of abnormal movements: No Awareness, Dental Status Current problems with teeth and/or dentures?: No Does patient usually wear dentures?: No Ed[place]?: No  CIWA:    COWS:  COWS Total Score: 6  Musculoskeletal: Strength & Muscle Tone: within normal limits Gait & Station: normal Patient leans: N/A  Psychiatric Specialty Exam:  Presentation  General Appearance:  Casual  Eye Contact: Fair  Speech: Pressured  Speech Volume: Normal  Handedness:No data recorded  Mood and Affect  Mood: Anxious  Affect: Appropriate   Thought Process  Thought Processes: Coherent  Descriptions of Associations:Intact  Orientation:Full (Time, Place and Person)  Thought Content:Logical  History of Schizophrenia/Schizoaffective disorder:No  Duration of Psychotic Symptoms:No data recorded Hallucinations:No data recorded Ideas of Reference:No data recorded Suicidal Thoughts:No data recorded  Homicidal Thoughts:No data recorded   Sensorium  Memory:No data recorded Judgment:No data recorded Insight: Fair   Art therapist  Concentration:No data recorded Attention Span: Poor  Recall: [employer] of Knowledge: Fair  Language: Fair   Psychomotor Activity  Psychomotor Activity: No data recorded   Assets  Assets: Communication Skills   Sleep  Sleep: No data recorded    Physical Exam: Physical Exam ROS  Blood pressure 123/65, pulse (!) 130, temperature 98 F (36.7 C), temperature source Oral, resp. rate 18, height 5\' 2"  (1.575 m), weight 108.9 kg, SpO2 98%. Body mass index is 43.9 kg/m.   Treatment Plan Summary: Daily contact with patient to assess and evaluate symptoms and progress in treatment  Audrey Luberto, PA-C 07/19/2023, 9:21 AM

## 2023-07-19 NOTE — Group Note (Signed)
 Date:  07/19/2023 Time:  11:00 PM  Group Topic/Focus:  Wrap-Up Group:   The focus of this group is to help patients review their daily goal of treatment and discuss progress on daily workbooks.    Participation Level:  None  Participation Quality:  Drowsy  Affect:  Blunted  Cognitive:  Lacking  Insight: Lacking  Engagement in Group:  Limited  Modes of Intervention:  Discussion   Fabiola Holy 07/19/2023, 11:00 PM

## 2023-07-19 NOTE — BH Assessment (Signed)
 Patient denies si,hi and avh. Patient received tramadol  x1 prn for pain. Patient remains safe on the unit q15 safety checks begin performed.

## 2023-07-19 NOTE — Group Note (Signed)
 Recreation Therapy Group Note   Group Topic:Healthy Support Systems  Group Date: 07/19/2023 Start Time: 1035 End Time: 1130 Facilitators: Yvonna Herder, CTRS Location: Craft Room  Group Description: Straw Bridge. In groups or individually, patients were given 10 plastic drinking straws and an equal length of masking tape. Using the materials provided, patients were instructed to build a free-standing bridge-like structure to suspend an everyday item (ex: deck of cards) off the floor or table surface. All materials were required to be used in Secondary school teacher. LRT facilitated post-activity discussion reviewing the importance of having strong and healthy support systems in our lives. LRT discussed how the people in our lives serve as the tape and the deck of cards we placed on top of our straw structure are the stressors we face in daily life. LRT and pts discussed what happens in our life when things get too heavy for us , and we don't have strong supports outside of the hospital. Pt shared 2 of their healthy supports in their life aloud in the group.   Goal Area(s) Addressed:  Patient will identify 2 healthy supports in their life. Patient will identify skills to successfully complete activity. Patient will identify correlation of this activity to life post-discharge.  Patient will build on frustration tolerance skills. Patient will increase team building and communication skills.    Affect/Mood: Appropriate   Participation Level: Active and Engaged   Participation Quality: Independent   Behavior: Appropriate, Calm, and Cooperative   Speech/Thought Process: Coherent   Insight: Good   Judgement: Good   Modes of Intervention: Education, Exploration, Group work, and Support   Patient Response to Interventions:  Attentive, Engaged, Interested , and Receptive   Education Outcome:  Acknowledges education   Clinical Observations/Individualized Feedback: [place] was active in  their participation of session activities and group discussion. Pt identified "my husband, kids and grandson" as healthy supports. Pt interacted well with LRT and peers duration of session.    Plan: Continue to engage patient in RT group sessions 2-3x/week.   Deatrice Factor, LRT, CTRS 07/19/2023 11:51 AM

## 2023-07-19 NOTE — Progress Notes (Signed)
   07/19/23 1000  Psych Admission Type (Psych Patients Only)  Admission Status Involuntary  Psychosocial Assessment  Patient Complaints Anxiety  Eye Contact Fair  Facial Expression Animated  Affect Anxious  Speech Logical/coherent  Interaction Assertive  Motor Activity Slow  Appearance/Hygiene In scrubs  Behavior Characteristics Cooperative;Unable to participate  Mood Pleasant  Thought Process  Coherency WDL  Content WDL  Delusions None reported or observed  Perception WDL  Hallucination None reported or observed  Judgment WDL  Confusion None  Danger to Self  Current suicidal ideation? Denies  Agreement Not to Harm Self Yes  Description of Agreement verbal  Danger to Others  Danger to Others None reported or observed

## 2023-07-19 NOTE — Group Note (Signed)
 Recreation Therapy Group Note   Group Topic:General Recreation  Group Date: 07/19/2023 Start Time: 1500 End Time: 1600 Facilitators: Deatrice Factor, LRT, CTRS Location: Courtyard  Group Description: [employer]. LRT and patients played games of basketball, drew with chalk, and played corn hole while outside in the courtyard while getting fresh air and sunlight. Music was being played in the background. LRT and peers conversed about different games they have played before, what they do in their free time and anything else that is on their minds. LRT encouraged pts to drink water after being outside, sweating and getting their heart rate up.  Goal Area(s) Addressed: Patient will build on frustration tolerance skills. Patients will partake in a competitive play game with peers. Patients will gain knowledge of new leisure interest/hobby.    Affect/Mood: Appropriate   Participation Level: Active   Participation Quality: Independent   Behavior: Appropriate   Speech/Thought Process: Coherent   Insight: Fair   Judgement: Fair    Modes of Intervention: Activity   Patient Response to Interventions:  Receptive   Education Outcome:  Acknowledges education   Clinical Observations/Individualized Feedback: [place] was active in their participation of session activities and group discussion. Pt interacted well with LRT and peers duration of session.    Plan: Continue to engage patient in RT group sessions 2-3x/week.   Deatrice Factor, LRT, CTRS 07/19/2023 5:51 PM

## 2023-07-19 NOTE — Plan of Care (Signed)
   Problem: Education: Goal: Emotional status will improve Outcome: Progressing Goal: Mental status will improve Outcome: Progressing

## 2023-07-19 NOTE — BH IP Treatment Plan (Signed)
 Interdisciplinary Treatment and Diagnostic Plan Update  07/19/2023 Time of Session: 10:19 Audrey [name] MRN: [id number]  Principal Diagnosis: MDD (major depressive disorder)  Secondary Diagnoses: Principal Problem:   MDD (major depressive disorder) Active Problems:   Opioid use with withdrawal (HCC)   Current Medications:  Current Facility-Administered Medications  Medication Dose Route Frequency Provider Last Rate Last Admin   acetaminophen  (TYLENOL ) tablet 650 mg  650 mg Oral Q6H PRN Ajibola, Ene A, NP       albuterol  (VENTOLIN  HFA) 108 (90 Base) MCG/ACT inhaler 2 puff  2 puff Inhalation Q6H PRN Madaram, Kondal R, MD       alum & mag hydroxide-simeth (MAALOX/MYLANTA) 200-200-20 MG/5ML suspension 30 mL  30 mL Oral Q4H PRN Ajibola, Ene A, NP       busPIRone  (BUSPAR ) tablet 10-20 mg  10-20 mg Oral Daily PRN Bobbitt, Shalon E, NP       clonazePAM  (KLONOPIN ) tablet 0.5 mg  0.5 mg Oral Daily Madaram, Kondal R, MD   0.5 mg at 07/19/23 4696   cloNIDine  (CATAPRES ) tablet 0.1 mg  0.1 mg Oral QID Bobbitt, Shalon E, NP   0.1 mg at 07/19/23 2952   Followed by   cloNIDine  (CATAPRES ) tablet 0.1 mg  0.1 mg Oral BH-qamhs Bobbitt, Shalon E, NP       Followed by   Cecily Cohen ON 07/22/2023] cloNIDine  (CATAPRES ) tablet 0.1 mg  0.1 mg Oral QAC breakfast Bobbitt, Shalon E, NP       dicyclomine  (BENTYL ) tablet 20 mg  20 mg Oral Q6H PRN Bobbitt, Shalon E, NP       haloperidol  (HALDOL ) tablet 5 mg  5 mg Oral TID PRN Ajibola, Ene A, NP       And   diphenhydrAMINE  (BENADRYL ) capsule 50 mg  50 mg Oral TID PRN Ajibola, Ene A, NP       haloperidol  lactate (HALDOL ) injection 5 mg  5 mg Intramuscular TID PRN Ajibola, Ene A, NP       And   diphenhydrAMINE  (BENADRYL ) injection 50 mg  50 mg Intramuscular TID PRN Ajibola, Ene A, NP       And   LORazepam  (ATIVAN ) injection 2 mg  2 mg Intramuscular TID PRN Ajibola, Ene A, NP       haloperidol  lactate (HALDOL ) injection 10 mg  10 mg Intramuscular TID PRN Ajibola, Ene  A, NP       And   diphenhydrAMINE  (BENADRYL ) injection 50 mg  50 mg Intramuscular TID PRN Ajibola, Ene A, NP       And   LORazepam  (ATIVAN ) injection 2 mg  2 mg Intramuscular TID PRN Ajibola, Ene A, NP       gabapentin  (NEURONTIN ) capsule 1,000 mg  1,000 mg Oral TID Madaram, Kondal R, MD   1,000 mg at 07/19/23 8413   hydrOXYzine  (ATARAX ) tablet 25 mg  25 mg Oral TID PRN Madaram, Kondal R, MD       loperamide  (IMODIUM ) capsule 2-4 mg  2-4 mg Oral PRN Bobbitt, Shalon E, NP       magnesium  hydroxide (MILK OF MAGNESIA) suspension 30 mL  30 mL Oral Daily PRN Ajibola, Ene A, NP       methocarbamol  (ROBAXIN ) tablet 500 mg  500 mg Oral Q8H PRN Bobbitt, Shalon E, NP   500 mg at 07/18/23 1235   naproxen  (NAPROSYN ) tablet 500 mg  500 mg Oral BID PRN Bobbitt, Shalon E, NP       ondansetron  (ZOFRAN -ODT) disintegrating tablet  4 mg  4 mg Oral Q8H PRN Madaram, Kondal R, MD       tiZANidine  (ZANAFLEX ) tablet 4 mg  4 mg Oral TID Madaram, Kondal R, MD   4 mg at 07/19/23 0830   traMADol  (ULTRAM ) tablet 50 mg  50 mg Oral TID PRN Bobbitt, Shalon E, NP   50 mg at 07/18/23 2149   traZODone  (DESYREL ) tablet 50 mg  50 mg Oral QHS PRN Ajibola, Ene A, NP   50 mg at 07/18/23 2149   verapamil  (CALAN -SR) CR tablet 240 mg  240 mg Oral Daily Bobbitt, Shalon E, NP   240 mg at 07/19/23 0830   PTA Medications: Medications Prior to Admission  Medication Sig Dispense Refill Last Dose/Taking   albuterol  (VENTOLIN  HFA) 108 (90 Base) MCG/ACT inhaler Inhale 2 puffs into the lungs every 6 (six) hours as needed for wheezing or shortness of breath. 8 g 2    busPIRone  (BUSPAR ) 10 MG tablet Take 10-20 mg by mouth daily as needed (Panic Attack).      clonazePAM  (KLONOPIN ) 0.5 MG tablet Take 0.5 mg by mouth daily.      gabapentin  (NEURONTIN ) 800 MG tablet Take 800 mg by mouth 3 (three) times daily.      hydrOXYzine  (ATARAX ) 25 MG tablet Take 1 tablet (25 mg total) by mouth 3 (three) times daily as needed. 30 tablet 0    ondansetron   (ZOFRAN -ODT) 4 MG disintegrating tablet Take 1 tablet (4 mg total) by mouth every 8 (eight) hours as needed for nausea or vomiting. 20 tablet 0    tiZANidine  (ZANAFLEX ) 4 MG tablet Take 4 mg by mouth 3 (three) times daily.      traMADol  (ULTRAM ) 50 MG tablet Take 50 mg by mouth 3 (three) times daily as needed.      verapamil  (CALAN -SR) 240 MG CR tablet Take 240 mg by mouth daily.       Patient Stressors: Health problems   Medication change or noncompliance    Patient Strengths: Motivation for treatment/growth  Supportive family/friends   Treatment Modalities: Medication Management, Group therapy, Case management,  1 to 1 session with clinician, Psychoeducation, Recreational therapy.   Physician Treatment Plan for Primary Diagnosis: MDD (major depressive disorder) Long Term Goal(s): Improvement in symptoms so as ready for discharge   Short Term Goals: Ability to identify changes in lifestyle to reduce recurrence of condition will improve  Medication Management: Evaluate patient's response, side effects, and tolerance of medication regimen.  Therapeutic Interventions: 1 to 1 sessions, Unit Group sessions and Medication administration.  Evaluation of Outcomes: Not Met  Physician Treatment Plan for Secondary Diagnosis: Principal Problem:   MDD (major depressive disorder) Active Problems:   Opioid use with withdrawal (HCC)  Long Term Goal(s): Improvement in symptoms so as ready for discharge   Short Term Goals: Ability to identify changes in lifestyle to reduce recurrence of condition will improve     Medication Management: Evaluate patient's response, side effects, and tolerance of medication regimen.  Therapeutic Interventions: 1 to 1 sessions, Unit Group sessions and Medication administration.  Evaluation of Outcomes: Not Met   RN Treatment Plan for Primary Diagnosis: MDD (major depressive disorder) Long Term Goal(s): Knowledge of disease and therapeutic regimen to maintain  health will improve  Short Term Goals: Ability to remain free from injury will improve, Ability to verbalize frustration and anger appropriately will improve, Ability to demonstrate self-control, Ability to participate in decision making will improve, Ability to verbalize feelings will improve, Ability to  disclose and discuss suicidal ideas, Ability to identify and develop effective coping behaviors will improve, and Compliance with prescribed medications will improve  Medication Management: RN will administer medications as ordered by provider, will assess and evaluate patient's response and provide education to patient for prescribed medication. RN will report any adverse and/or side effects to prescribing provider.  Therapeutic Interventions: 1 on 1 counseling sessions, Psychoeducation, Medication administration, Evaluate responses to treatment, Monitor vital signs and CBGs as ordered, Perform/monitor CIWA, COWS, AIMS and Fall Risk screenings as ordered, Perform wound care treatments as ordered.  Evaluation of Outcomes: Not Met   LCSW Treatment Plan for Primary Diagnosis: MDD (major depressive disorder) Long Term Goal(s): Safe transition to appropriate next level of care at discharge, Engage patient in therapeutic group addressing interpersonal concerns.  Short Term Goals: Engage patient in aftercare planning with referrals and resources, Increase social support, Increase ability to appropriately verbalize feelings, Increase emotional regulation, Facilitate acceptance of mental health diagnosis and concerns, Facilitate patient progression through stages of change regarding substance use diagnoses and concerns, Identify triggers associated with mental health/substance abuse issues, and Increase skills for wellness and recovery  Therapeutic Interventions: Assess for all discharge needs, 1 to 1 time with Social worker, Explore available resources and support systems, Assess for adequacy in community  support network, Educate family and significant other(s) on suicide prevention, Complete Psychosocial Assessment, Interpersonal group therapy.  Evaluation of Outcomes: Not Met   Progress in Treatment: Attending groups: No. Participating in groups: No. Taking medication as prescribed: Yes. Toleration medication: Yes. Family/Significant other contact made: No, will contact:  if given permission.  Patient understands diagnosis: Yes. Discussing patient identified problems/goals with staff: Yes. Medical problems stabilized or resolved: Yes. Denies suicidal/homicidal ideation: Yes. Issues/concerns per patient self-inventory: No. Other: none.  New problem(s) identified: No, Describe:  none identified.   New Short Term/Long Term Goal(s): detox, medication management for mood stabilization; elimination of SI thoughts; development of comprehensive mental wellness/sobriety plan.  Patient Goals:  "Just getting over the withdrawals."  Discharge Plan or Barriers: CSW will assist pt with development of an appropriate aftercare/discharge plan.   Reason for Continuation of Hospitalization: Depression Medication stabilization Suicidal ideation Withdrawal symptoms  Estimated Length of Stay: 1-7 days  Last 3 [place] Suicide Severity Risk Score: Flowsheet Row Admission (Current) from 07/16/2023 in [facility] INPATIENT BEHAVIORAL MEDICINE Most recent reading at 07/17/2023  3:13 AM ED from 07/16/2023 in [facility] Emergency Department at [facility] Most recent reading at 07/16/2023  2:21 PM ED from 05/11/2023 in [facility] Emergency Department at [facility] Most recent reading at 05/11/2023  5:53 AM  C-SSRS RISK CATEGORY High Risk High Risk No Risk       Last PHQ 2/9 Scores:     No data to display          Scribe for Treatment Team: Randolm Butte, LCSW 07/19/2023 10:40 AM

## 2023-07-20 DIAGNOSIS — F331 Major depressive disorder, recurrent, moderate: Secondary | ICD-10-CM | POA: Diagnosis not present

## 2023-07-20 MED ORDER — TRAZODONE HCL 100 MG PO TABS
100.0000 mg | ORAL_TABLET | Freq: Every evening | ORAL | Status: DC | PRN
  Administered 2023-07-20: 100 mg via ORAL
  Filled 2023-07-20: qty 1

## 2023-07-20 NOTE — Progress Notes (Signed)
 [facility] [place] MD Progress Note  07/20/2023 3:49 PM Audrey [name]  MRN:  [id number]   Patient is a 48 year old female who currently lives with her husband has significant challenges with opioid dependence patient reports that she was on morphine  30 mg p.o. twice daily for significant amount of time about a week ago she reports that she ran out because she was only given 30-day supply with her psychiatrist or a provider who was retiring she is is significantly dysphoric and in active withdrawal very subjective presentation demanding for medications immediately patient is denying any symptoms of mania hypomania psychosis with    Subjective: Patient is seen for reassessment, reports that she is doing okay, complains of back pain.  She is denying any withdrawal symptoms.  States that even though she told her husband that she wanted to die, but the thought of her children made her not to act on the suicidal thoughts.  Denies any depression, endorses some anxiety.  Denies SI/HI and AVH.  Most recent COWS scores are 5, 2.  Reports difficulty sleeping overnight.  Appetite is good.  Will continue with COWS protocol at this time for detox   Principal Problem: MDD (major depressive disorder) Diagnosis: Principal Problem:   MDD (major depressive disorder) Active Problems:   Opioid use with withdrawal (HCC)  Total Time spent with patient: 20 minutes  Past Psychiatric History:   History of anxiety and opiate dependence  Past Medical History:  Past Medical History:  Diagnosis Date   Anxiety    Arthritis    DDD (degenerative disc disease), lumbar 12/10/2020   Depression    Heart murmur    Pseudotumor cerebri    PTSD (post-traumatic stress disorder)         Sciatica     Past Surgical History:  Procedure Laterality Date   ABDOMINAL HYSTERECTOMY     ABDOMINAL SURGERY     APPENDECTOMY     CARDIAC CATHETERIZATION     CHOLECYSTECTOMY     COLONOSCOPY WITH PROPOFOL  N/A 02/18/2023   Procedure: COLONOSCOPY  WITH PROPOFOL ;  Surgeon: Marnee Sink, MD;  Location: [facility] ENDOSCOPY;  Service: Endoscopy;  Laterality: N/A;   ESOPHAGOGASTRODUODENOSCOPY (EGD) WITH PROPOFOL  N/A 02/18/2023   Procedure: ESOPHAGOGASTRODUODENOSCOPY (EGD) WITH PROPOFOL ;  Surgeon: Marnee Sink, MD;  Location: [facility] ENDOSCOPY;  Service: Endoscopy;  Laterality: N/A;   KNEE SURGERY     TONSILLECTOMY     TUBAL LIGATION     Family History:  Family History  Problem Relation Age of Onset   Bipolar disorder Mother    Schizophrenia Sister    Schizophrenia Sister    Anxiety disorder Other    Depression Other    Suicidality Neg Hx    Family Psychiatric  History:  Social History:  Social History   Substance and Sexual Activity  Alcohol Use Never     Social History   Substance and Sexual Activity  Drug Use Never    Social History   Socioeconomic History   Marital status: Married    Spouse name: Not on file   Number of children: Not on file   Years of education: Not on file   Highest education level: Not on file  Occupational History   Not on file  Tobacco Use   Smoking status: Never   Smokeless tobacco: Never  Vaping Use   Vaping status: Never Used  Substance and Sexual Activity   Alcohol use: Never   Drug use: Never   Sexual activity: Yes  Other Topics Concern  Not on file  Social History Narrative   Not on file   Social Drivers of Health   Financial Resource Strain: Low Risk  (11/10/2022)   Received from [facility]   Overall Financial Resource Strain (CARDIA)    Difficulty of Paying Living Expenses: Not hard at all  Food Insecurity: No Food Insecurity (07/17/2023)   Hunger Vital Sign    Worried About Running Out of Food in the Last Year: Never true    Ran Out of Food in the Last Year: Never true  Transportation Needs: No Transportation Needs (07/17/2023)   PRAPARE - Administrator, Civil Service (Medical): No    Lack of Transportation (Non-Medical): No  Physical Activity: Sufficiently  Active (11/10/2022)   Received from [facility]   Exercise Vital Sign    Days of Exercise per Week: 5 days    Minutes of Exercise per Session: 30 min  Stress: Stress Concern Present (11/10/2022)   Received from [facility] of Occupational Health - Occupational Stress Questionnaire    Feeling of Stress : Very much  Social Connections: Unknown (07/17/2023)   Social Connection and Isolation Panel [NHANES]    Frequency of Communication with Friends and Family: Three times a week    Frequency of Social Gatherings with Friends and Family: Never    Attends Religious Services: 1 to 4 times per year    Active Member of [employer] or Organizations: Yes    Attends Banker Meetings: Never    Marital Status: Not on file   Additional Social History:                         Sleep: Good  Appetite:  Good  Current Medications: Current Facility-Administered Medications  Medication Dose Route Frequency Provider Last Rate Last Admin   acetaminophen  (TYLENOL ) tablet 650 mg  650 mg Oral Q6H PRN Ajibola, Ene A, NP       albuterol  (VENTOLIN  HFA) 108 (90 Base) MCG/ACT inhaler 2 puff  2 puff Inhalation Q6H PRN Madaram, Kondal R, MD   2 puff at 07/20/23 1423   alum & mag hydroxide-simeth (MAALOX/MYLANTA) 200-200-20 MG/5ML suspension 30 mL  30 mL Oral Q4H PRN Ajibola, Ene A, NP       busPIRone  (BUSPAR ) tablet 10-20 mg  10-20 mg Oral Daily PRN Bobbitt, Shalon E, NP       clonazePAM  (KLONOPIN ) tablet 0.5 mg  0.5 mg Oral Daily Madaram, Kondal R, MD   0.5 mg at 07/20/23 1610   cloNIDine  (CATAPRES ) tablet 0.1 mg  0.1 mg Oral BH-qamhs Bobbitt, Shalon E, NP   0.1 mg at 07/20/23 9604   Followed by   Cecily Cohen ON 07/22/2023] cloNIDine  (CATAPRES ) tablet 0.1 mg  0.1 mg Oral QAC breakfast Bobbitt, Shalon E, NP       dicyclomine  (BENTYL ) tablet 20 mg  20 mg Oral Q6H PRN Bobbitt, Shalon E, NP       haloperidol  (HALDOL ) tablet 5 mg  5 mg Oral TID PRN Ajibola, Ene A, NP       And    diphenhydrAMINE  (BENADRYL ) capsule 50 mg  50 mg Oral TID PRN Ajibola, Ene A, NP       haloperidol  lactate (HALDOL ) injection 5 mg  5 mg Intramuscular TID PRN Ajibola, Ene A, NP       And   diphenhydrAMINE  (BENADRYL ) injection 50 mg  50 mg Intramuscular TID PRN Ajibola, Ene A, NP  And   LORazepam  (ATIVAN ) injection 2 mg  2 mg Intramuscular TID PRN Ajibola, Ene A, NP       haloperidol  lactate (HALDOL ) injection 10 mg  10 mg Intramuscular TID PRN Ajibola, Ene A, NP       And   diphenhydrAMINE  (BENADRYL ) injection 50 mg  50 mg Intramuscular TID PRN Ajibola, Ene A, NP       And   LORazepam  (ATIVAN ) injection 2 mg  2 mg Intramuscular TID PRN Ajibola, Ene A, NP       gabapentin  (NEURONTIN ) capsule 1,000 mg  1,000 mg Oral TID Madaram, Kondal R, MD   1,000 mg at 07/20/23 1248   hydrOXYzine  (ATARAX ) tablet 25 mg  25 mg Oral TID PRN Madaram, Kondal R, MD   25 mg at 07/19/23 2125   loperamide  (IMODIUM ) capsule 2-4 mg  2-4 mg Oral PRN Bobbitt, Shalon E, NP       magnesium  hydroxide (MILK OF MAGNESIA) suspension 30 mL  30 mL Oral Daily PRN Ajibola, Ene A, NP       methocarbamol  (ROBAXIN ) tablet 500 mg  500 mg Oral Q8H PRN Bobbitt, Shalon E, NP   500 mg at 07/19/23 1233   naproxen  (NAPROSYN ) tablet 500 mg  500 mg Oral BID PRN Bobbitt, Shalon E, NP       ondansetron  (ZOFRAN -ODT) disintegrating tablet 4 mg  4 mg Oral Q8H PRN Madaram, Kondal R, MD   4 mg at 07/19/23 2255   tiZANidine  (ZANAFLEX ) tablet 4 mg  4 mg Oral TID Madaram, Kondal R, MD   4 mg at 07/20/23 1248   traMADol  (ULTRAM ) tablet 50 mg  50 mg Oral TID PRN Bobbitt, Shalon E, NP   50 mg at 07/19/23 2125   traZODone  (DESYREL ) tablet 50 mg  50 mg Oral QHS PRN Ajibola, Ene A, NP   50 mg at 07/19/23 2125   verapamil  (CALAN -SR) CR tablet 240 mg  240 mg Oral Daily Bobbitt, Shalon E, NP   240 mg at 07/20/23 6073    Lab Results: No results found for this or any previous visit (from the past 48 hours).  Blood Alcohol level:  Lab Results  Component  Value Date   ETH <10 07/16/2023   ETH <10 06/22/2021    Metabolic Disorder Labs: Lab Results  Component Value Date   HGBA1C 4.8 08/15/2021   MPG 91.06 08/15/2021   No results found for: "PROLACTIN" Lab Results  Component Value Date   CHOL 219 (H) 08/15/2021   TRIG 203 (H) 08/15/2021   HDL 48 08/15/2021   CHOLHDL 4.6 08/15/2021   VLDL 41 (H) 08/15/2021   LDLCALC 130 (H) 08/15/2021    Physical Findings: AIMS: Facial and Oral Movements Muscles of Facial Expression: None Lips and Perioral Area: None Jaw: None Tongue: None,Extremity Movements Upper (arms, wrists, hands, fingers): None Lower (legs, knees, ankles, toes): None, Trunk Movements Neck, shoulders, hips: None, Global Judgements Severity of abnormal movements overall : None Incapacitation due to abnormal movements: None Patient's awareness of abnormal movements: No Awareness, Dental Status Current problems with teeth and/or dentures?: No Does patient usually wear dentures?: No Edent[place]?: No  CIWA:    COWS:  COWS Total Score: 2  Musculoskeletal: Strength & Muscle Tone: within normal limits Gait & Station: normal Patient leans: N/A  Psychiatric Specialty Exam:  Presentation  General Appearance:  Casual  Eye Contact: Fair  Speech: Pressured  Speech Volume: Normal  Handedness:No data recorded  Mood and Affect  Mood: Anxious  Affect: Appropriate   Thought Process  Thought Processes: Coherent  Descriptions of Associations:Intact  Orientation:Full (Time, Place and Person)  Thought Content:Logical  History of Schizophrenia/Schizoaffective disorder:No  Duration of Psychotic Symptoms:No data recorded Hallucinations:No data recorded Ideas of Reference:No data recorded Suicidal Thoughts:No data recorded  Homicidal Thoughts:No data recorded   Sensorium  Memory:No data recorded Judgment:No data recorded Insight: Fair   Art therapist  Concentration:No data recorded Attention  Span: Poor  Recall: [employer] of Knowledge: Fair  Language: Fair   Psychomotor Activity  Psychomotor Activity: No data recorded   Assets  Assets: Communication Skills   Sleep  Sleep: No data recorded    Physical Exam: Physical Exam Vitals and nursing note reviewed.  Constitutional:      Appearance: Normal appearance.  Eyes:     Extraocular Movements: Extraocular movements intact.  Pulmonary:     Effort: Pulmonary effort is normal.  Skin:    General: Skin is dry.  Neurological:     Mental Status: She is alert and oriented to person, place, and time. Mental status is at baseline.  Psychiatric:        Attention and Perception: Attention and perception normal.        Mood and Affect: Mood and affect normal.        Speech: Speech normal.        Behavior: Behavior normal. Behavior is cooperative.        Thought Content: Thought content normal.        Cognition and Memory: Cognition and memory normal.        Judgment: Judgment is impulsive.    Review of Systems  All other systems reviewed and are negative.  Blood pressure 120/87, pulse (!) 118, temperature (!) 97.1 F (36.2 C), resp. rate 20, height 5\' 2"  (1.575 m), weight 108.9 kg, SpO2 97%. Body mass index is 43.9 kg/m.   Treatment Plan Summary: Daily contact with patient to assess and evaluate symptoms and progress in treatment Patient is denying any psychiatric symptoms, however she does continue to score on COWS protocol, we will continue with treatment at this time for further detox.  -Continue COWS protocol -Continue BuSpar  10 to 20 mg daily as needed for panic attacks -Continue Klonopin  0.5 mg daily for anxiety -Continue gabapentin  1000 mg 3 times daily for chronic pain -Continue tizanidine  4 mg 3 times daily for muscle spasms, tramadol  50 mg 3 times daily as needed for pain -Continue trazodone  50 mg at bedtime as needed for sleep   [employer], PA-C 07/20/2023, 3:49 PM

## 2023-07-20 NOTE — Group Note (Signed)
 Recreation Therapy Group Note   Group Topic:Goal Setting  Group Date: 07/20/2023 Start Time: 1000 End Time: 1100 Facilitators: Deatrice Factor, LRT, CTRS Location:  Craft Room  Group Description: Product/process development scientist. Patients were given many different magazines, a glue stick, markers, and a piece of cardstock paper. LRT and pts discussed the importance of having goals in life. LRT and pts discussed the difference between short-term and long-term goals, as well as what a SMART goal is. LRT encouraged pts to create a vision board, with images they picked and then cut out with safety scissors from the magazine, for themselves, that capture their short and long-term goals. LRT encouraged pts to show and explain their vision board to the group.   Goal Area(s) Addressed:  Patient will gain knowledge of short vs. long term goals.  Patient will identify goals for themselves. Patient will practice setting SMART goals. Patient will verbalize their goals to LRT and peers.   Affect/Mood: Appropriate   Participation Level: Active and Engaged   Participation Quality: Independent   Behavior: Appropriate, Calm, and Cooperative   Speech/Thought Process: Coherent   Insight: Good   Judgement: Good   Modes of Intervention: Art and Education   Patient Response to Interventions:  Attentive, Engaged, Interested , and Receptive   Education Outcome:  Acknowledges education   Clinical Observations/Individualized Feedback: Jaelie was active in their participation of session activities and group discussion. Pt identified that ger goals are to spend time with her grandson, to get a house by this summer, to spend time with her cats, to have a nice meal at a restaurant once she leaves the hospital and to go see Pink in concert again. Pt interacted well with LRT and peers duration of session.    Plan: Continue to engage patient in RT group sessions 2-3x/week.   Deatrice Factor, LRT, CTRS 07/20/2023 12:22  PM

## 2023-07-20 NOTE — Plan of Care (Signed)
   Problem: Education: Goal: Knowledge of [place] [facility] Education information/materials will improve Outcome: Progressing Goal: Emotional status will improve Outcome: Progressing Goal: Mental status will improve Outcome: Progressing

## 2023-07-20 NOTE — Group Note (Signed)
 Date:  07/20/2023 Time:  9:57 AM  Group Topic/Focus:  Goals Group:   The focus of this group is to help patients establish daily goals to achieve during treatment and discuss how the patient can incorporate goal setting into their daily lives to aide in recovery.    Participation Level:  Active  Participation Quality:  Appropriate  Affect:  Appropriate  Cognitive:  Appropriate  Insight: Appropriate  Engagement in Group:  Engaged  Modes of Intervention:  Discussion, Education, and Support  Additional Comments:    Audrey [name] 07/20/2023, 9:57 AM

## 2023-07-20 NOTE — Plan of Care (Signed)
   Problem: Education: Goal: Knowledge of [facility] Education information/materials will improve Outcome: Progressing Goal: Emotional status will improve Outcome: Progressing Goal: Mental status will improve Outcome: Progressing Goal: Verbalization of understanding the information provided will improve Outcome: Progressing

## 2023-07-20 NOTE — Group Note (Signed)
 Recreation Therapy Group Note   Group Topic:Stress Management  Group Date: 07/20/2023 Start Time: 1500 End Time: 1600 Facilitators: Deatrice Factor, LRT, CTRS Location: Courtyard  Group Description: [employer]. LRT and patients played games of basketball, drew with chalk, and played corn hole while outside in the courtyard while getting fresh air and sunlight. Music was being played in the background. LRT and peers conversed about different games they have played before, what they do in their free time and anything else that is on their minds. LRT encouraged pts to drink water after being outside, sweating and getting their heart rate up.  Goal Area(s) Addressed: Patient will build on frustration tolerance skills. Patients will partake in a competitive play game with peers. Patients will gain knowledge of new leisure interest/hobby.    Affect/Mood: N/A   Participation Level: Did not attend    Clinical Observations/Individualized Feedback: Patient did not attend group.   Plan: Continue to engage patient in RT group sessions 2-3x/week.   [id number] LRT, CTRS 07/20/2023 5:25 PM

## 2023-07-20 NOTE — Progress Notes (Signed)
   07/20/23 0900  Psych Admission Type (Psych Patients Only)  Admission Status Involuntary  Psychosocial Assessment  Patient Complaints Anxiety;Sleep disturbance  Eye Contact Fair  Facial Expression Animated  Affect Appropriate to circumstance  Speech Logical/coherent  Interaction Assertive  Motor Activity Slow  Appearance/Hygiene Unremarkable  Behavior Characteristics Cooperative;Appropriate to situation  Mood Pleasant  Aggressive Behavior  Effect No apparent injury  Thought Process  Coherency WDL  Content WDL  Delusions None reported or observed  Perception WDL  Hallucination None reported or observed  Judgment Impaired  Confusion None  Danger to Self  Current suicidal ideation? Denies  Agreement Not to Harm Self Yes  Description of Agreement verbal  Danger to Others  Danger to Others None reported or observed   Patient states this morning " 50 mg Trazadone didn't help me with sleep." Later patient stated that she feels better than this morning. Support and encouragement given.

## 2023-07-20 NOTE — Group Note (Signed)
 Date:  07/20/2023 Time:  10:59 PM  Group Topic/Focus:  Wrap-Up Group:   The focus of this group is to help patients review their daily goal of treatment and discuss progress on daily workbooks.    Participation Level:  Active  Participation Quality:  Appropriate and Attentive  Affect:  Appropriate  Cognitive:  Alert  Insight: Appropriate  Engagement in Group:  Engaged  Modes of Intervention:  Discussion and Orientation  Additional Comments:     Maglione,Elvie Palomo E 07/20/2023, 10:59 PM

## 2023-07-20 NOTE — Plan of Care (Signed)
  Problem: Safety: Goal: Periods of time without injury will increase Outcome: Progressing   Problem: Physical Regulation: Goal: Ability to maintain clinical measurements within normal limits will improve Outcome: Progressing   Problem: Coping: Goal: Ability to verbalize frustrations and anger appropriately will improve Outcome: Progressing   Problem: Activity: Goal: Interest or engagement in activities will improve Outcome: Progressing   Problem: Education: Goal: Mental status will improve Outcome: Progressing   Problem: Education: Goal: Emotional status will improve Outcome: Progressing

## 2023-07-21 DIAGNOSIS — F331 Major depressive disorder, recurrent, moderate: Secondary | ICD-10-CM | POA: Diagnosis not present

## 2023-07-21 MED ORDER — ESTRADIOL 0.05 MG/24HR TD PTWK
0.0500 mg | MEDICATED_PATCH | TRANSDERMAL | Status: DC
  Administered 2023-07-21: 0.05 mg via TRANSDERMAL
  Filled 2023-07-21 (×2): qty 1

## 2023-07-21 MED ORDER — MIRTAZAPINE 15 MG PO TABS
7.5000 mg | ORAL_TABLET | Freq: Every day | ORAL | Status: DC
  Administered 2023-07-21: 7.5 mg via ORAL
  Filled 2023-07-21: qty 1

## 2023-07-21 NOTE — Plan of Care (Signed)

## 2023-07-21 NOTE — Progress Notes (Signed)
 Patient requested to take 1700 scheduled medications closer to bedtime, since they make her drowsy, and she wants to get some sleep tonight. This Clinical research associate moved medication to 2200.

## 2023-07-21 NOTE — Group Note (Unsigned)
 Date:  07/21/2023 Time:  10:45 PM  Group Topic/Focus:  Coping With Mental Health Crisis:   The purpose of this group is to help patients identify strategies for coping with mental health crisis.  Group discusses possible causes of crisis and ways to manage them effectively.     Participation Level:  {B[facility] PARTICIPATION VWUJW:11914}  Participation Quality:  {B[facility] PARTICIPATION QUALITY:22265}  Affect:  {BHH AFFECT:22266}  Cognitive:  {BHH COGNITIVE:22267}  Insight: {BHH Insight2:20797}  Engagement in Group:  {B[facility] ENGAGEMENT IN NWGNF:62130}  Modes of Intervention:  {BHH MODES OF INTERVENTION:22269}  Additional Comments:  ***  Domenic Schoenberger 07/21/2023, 10:45 PM

## 2023-07-21 NOTE — Plan of Care (Signed)
   Problem: Education: Goal: Knowledge of [facility] Education information/materials will improve Outcome: Progressing Goal: Emotional status will improve Outcome: Progressing Goal: Mental status will improve Outcome: Progressing Goal: Verbalization of understanding the information provided will improve Outcome: Progressing

## 2023-07-21 NOTE — Progress Notes (Signed)
 [facility] MD Progress Note  07/21/2023 3:47 PM TSERING LEAMAN  MRN:  [id number]   Patient is a 48 year old female who currently lives with her husband has significant challenges with opioid dependence patient reports that she was on morphine  30 mg p.o. twice daily for significant amount of time about a week ago she reports that she ran out because she was only given 30-day supply with her psychiatrist or a provider who was retiring she is is significantly dysphoric and in active withdrawal very subjective presentation demanding for medications immediately patient is denying any symptoms of mania hypomania psychosis with    Subjective: Patient is seen for reassessment, reports that she is doing okay, complains of back pain.  She is denying any withdrawal symptoms.  States that even though she told her husband that she wanted to die, but the thought of her children made her not to act on the suicidal thoughts.  Denies any depression, endorses some anxiety.  Denies SI/HI and AVH.  Most recent COWS scores are 5, 2.  Reports difficulty sleeping overnight.  Appetite is good.  Will continue with COWS protocol at this time for detox   Principal Problem: MDD (major depressive disorder) Diagnosis: Principal Problem:   MDD (major depressive disorder) Active Problems:   Opioid use with withdrawal (HCC)  Total Time spent with patient: 20 minutes  Past Psychiatric History:   History of anxiety and opiate dependence  Past Medical History:  Past Medical History:  Diagnosis Date   Anxiety    Arthritis    DDD (degenerative disc disease), lumbar 12/10/2020   Depression    Heart murmur    Pseudotumor cerebri    PTSD (post-traumatic stress disorder)         Sciatica     Past Surgical History:  Procedure Laterality Date   ABDOMINAL HYSTERECTOMY     ABDOMINAL SURGERY     APPENDECTOMY     CARDIAC CATHETERIZATION     CHOLECYSTECTOMY     COLONOSCOPY WITH PROPOFOL  N/A 02/18/2023   Procedure: COLONOSCOPY  WITH PROPOFOL ;  Surgeon: Marnee Sink, MD;  Location: [facility] ENDOSCOPY;  Service: Endoscopy;  Laterality: N/A;   ESOPHAGOGASTRODUODENOSCOPY (EGD) WITH PROPOFOL  N/A 02/18/2023   Procedure: ESOPHAGOGASTRODUODENOSCOPY (EGD) WITH PROPOFOL ;  Surgeon: Marnee Sink, MD;  Location: [facility] ENDOSCOPY;  Service: Endoscopy;  Laterality: N/A;   KNEE SURGERY     TONSILLECTOMY     TUBAL LIGATION     Family History:  Family History  Problem Relation Age of Onset   Bipolar disorder Mother    Schizophrenia Sister    Schizophrenia Sister    Anxiety disorder Other    Depression Other    Suicidality Neg Hx    Family Psychiatric  History:  Social History:  Social History   Substance and Sexual Activity  Alcohol Use Never     Social History   Substance and Sexual Activity  Drug Use Never    Social History   Socioeconomic History   Marital status: Married    Spouse name: Not on file   Number of children: Not on file   Years of education: Not on file   Highest education level: Not on file  Occupational History   Not on file  Tobacco Use   Smoking status: Never   Smokeless tobacco: Never  Vaping Use   Vaping status: Never Used  Substance and Sexual Activity   Alcohol use: Never   Drug use: Never   Sexual activity: Yes  Other Topics Concern  Not on file  Social History Narrative   Not on file   Social Drivers of Health   Financial Resource Strain: Low Risk  (11/10/2022)   Received from [facility]   Overall Financial Resource Strain (CARDIA)    Difficulty of Paying Living Expenses: Not hard at all  Food Insecurity: No Food Insecurity (07/17/2023)   Hunger Vital Sign    Worried About Running Out of Food in the Last Year: Never true    Ran Out of Food in the Last Year: Never true  Transportation Needs: No Transportation Needs (07/17/2023)   PRAPARE - Administrator, Civil Service (Medical): No    Lack of Transportation (Non-Medical): No  Physical Activity: Sufficiently  Active (11/10/2022)   Received from [facility]   Exercise Vital Sign    Days of Exercise per Week: 5 days    Minutes of Exercise per Session: 30 min  Stress: Stress Concern Present (11/10/2022)   Received from [facility] of Occupational Health - Occupational Stress Questionnaire    Feeling of Stress : Very much  Social Connections: Unknown (07/17/2023)   Social Connection and Isolation Panel [NHANES]    Frequency of Communication with Friends and Family: Three times a week    Frequency of Social Gatherings with Friends and Family: Never    Attends Religious Services: 1 to 4 times per year    Active Member of [employer] or Organizations: Yes    Attends Banker Meetings: Never    Marital Status: Not on file   Additional Social History:                         Sleep: Good  Appetite:  Good  Current Medications: Current Facility-Administered Medications  Medication Dose Route Frequency Provider Last Rate Last Admin   acetaminophen  (TYLENOL ) tablet 650 mg  650 mg Oral Q6H PRN Ajibola, Ene A, NP       albuterol  (VENTOLIN  HFA) 108 (90 Base) MCG/ACT inhaler 2 puff  2 puff Inhalation Q6H PRN Madaram, Kondal R, MD   2 puff at 07/20/23 1423   alum & mag hydroxide-simeth (MAALOX/MYLANTA) 200-200-20 MG/5ML suspension 30 mL  30 mL Oral Q4H PRN Ajibola, Ene A, NP       busPIRone  (BUSPAR ) tablet 10-20 mg  10-20 mg Oral Daily PRN Bobbitt, Shalon E, NP       clonazePAM  (KLONOPIN ) tablet 0.5 mg  0.5 mg Oral Daily Madaram, Kondal R, MD   0.5 mg at 07/21/23 0832   [START ON 07/22/2023] cloNIDine  (CATAPRES ) tablet 0.1 mg  0.1 mg Oral QAC breakfast Bobbitt, Shalon E, NP       dicyclomine  (BENTYL ) tablet 20 mg  20 mg Oral Q6H PRN Bobbitt, Shalon E, NP       haloperidol  (HALDOL ) tablet 5 mg  5 mg Oral TID PRN Ajibola, Ene A, NP       And   diphenhydrAMINE  (BENADRYL ) capsule 50 mg  50 mg Oral TID PRN Ajibola, Ene A, NP       haloperidol  lactate (HALDOL ) injection 5  mg  5 mg Intramuscular TID PRN Ajibola, Ene A, NP       And   diphenhydrAMINE  (BENADRYL ) injection 50 mg  50 mg Intramuscular TID PRN Ajibola, Ene A, NP       And   LORazepam  (ATIVAN ) injection 2 mg  2 mg Intramuscular TID PRN Ajibola, Ene A, NP  haloperidol  lactate (HALDOL ) injection 10 mg  10 mg Intramuscular TID PRN Ajibola, Ene A, NP       And   diphenhydrAMINE  (BENADRYL ) injection 50 mg  50 mg Intramuscular TID PRN Ajibola, Ene A, NP       And   LORazepam  (ATIVAN ) injection 2 mg  2 mg Intramuscular TID PRN Ajibola, Ene A, NP       estradiol  (CLIMARA  - Dosed in mg/24 hr) patch 0.05 mg  0.05 mg Transdermal Weekly Dajsha Massaro, PA-C   0.05 mg at 07/21/23 1254   gabapentin  (NEURONTIN ) capsule 1,000 mg  1,000 mg Oral TID Madaram, Kondal R, MD   1,000 mg at 07/21/23 1204   hydrOXYzine  (ATARAX ) tablet 25 mg  25 mg Oral TID PRN Madaram, Kondal R, MD   25 mg at 07/19/23 2125   loperamide  (IMODIUM ) capsule 2-4 mg  2-4 mg Oral PRN Bobbitt, Shalon E, NP       magnesium  hydroxide (MILK OF MAGNESIA) suspension 30 mL  30 mL Oral Daily PRN Ajibola, Ene A, NP       methocarbamol  (ROBAXIN ) tablet 500 mg  500 mg Oral Q8H PRN Bobbitt, Shalon E, NP   500 mg at 07/21/23 6301   mirtazapine  (REMERON ) tablet 7.5 mg  7.5 mg Oral QHS Sheresa Cullop, PA-C       naproxen  (NAPROSYN ) tablet 500 mg  500 mg Oral BID PRN Bobbitt, Shalon E, NP       ondansetron  (ZOFRAN -ODT) disintegrating tablet 4 mg  4 mg Oral Q8H PRN Madaram, Kondal R, MD   4 mg at 07/19/23 2255   tiZANidine  (ZANAFLEX ) tablet 4 mg  4 mg Oral TID Madaram, Kondal R, MD   4 mg at 07/21/23 1204   traMADol  (ULTRAM ) tablet 50 mg  50 mg Oral TID PRN Bobbitt, Shalon E, NP   50 mg at 07/21/23 0831   verapamil  (CALAN -SR) CR tablet 240 mg  240 mg Oral Daily Bobbitt, Shalon E, NP   240 mg at 07/21/23 0831    Lab Results: No results found for this or any previous visit (from the past 48 hours).  Blood Alcohol level:  Lab Results  Component Value  Date   ETH <10 07/16/2023   ETH <10 06/22/2021    Metabolic Disorder Labs: Lab Results  Component Value Date   HGBA1C 4.8 08/15/2021   MPG 91.06 08/15/2021   No results found for: "PROLACTIN" Lab Results  Component Value Date   CHOL 219 (H) 08/15/2021   TRIG 203 (H) 08/15/2021   HDL 48 08/15/2021   CHOLHDL 4.6 08/15/2021   VLDL 41 (H) 08/15/2021   LDLCALC 130 (H) 08/15/2021    Physical Findings: AIMS: Facial and Oral Movements Muscles of Facial Expression: None Lips and Perioral Area: None Jaw: None Tongue: None,Extremity Movements Upper (arms, wrists, hands, fingers): None Lower (legs, knees, ankles, toes): None, Trunk Movements Neck, shoulders, hips: None, Global Judgements Severity of abnormal movements overall : None Incapacitation due to abnormal movements: None Patient's awareness of abnormal movements: No Awareness, Dental Status Current problems with teeth and/or dentures?: No Does patient usually wear dentures?: No Edentia?: No  CIWA:    COWS:  COWS Total Score: 0  Musculoskeletal: Strength & Muscle Tone: within normal limits Gait & Station: normal Patient leans: N/A  Psychiatric Specialty Exam:  Presentation  General Appearance:  Casual  Eye Contact: Fair  Speech: Pressured  Speech Volume: Normal  Handedness:No data recorded  Mood and Affect  Mood: Anxious  Affect:  Appropriate   Thought Process  Thought Processes: Coherent  Descriptions of Associations:Intact  Orientation:Full (Time, Place and Person)  Thought Content:Logical  History of Schizophrenia/Schizoaffective disorder:No  Duration of Psychotic Symptoms:No data recorded Hallucinations:No data recorded Ideas of Reference:No data recorded Suicidal Thoughts:No data recorded  Homicidal Thoughts:No data recorded   Sensorium  Memory:No data recorded Judgment:No data recorded Insight: Fair   Art therapist  Concentration:No data recorded Attention  Span: Poor  Recall: [employer] of Knowledge: Fair  Language: Fair   Psychomotor Activity  Psychomotor Activity: No data recorded   Assets  Assets: Communication Skills   Sleep  Sleep: No data recorded    Physical Exam: Physical Exam Vitals and nursing note reviewed.  Constitutional:      Appearance: Normal appearance.  Eyes:     Extraocular Movements: Extraocular movements intact.  Pulmonary:     Effort: Pulmonary effort is normal.  Skin:    General: Skin is dry.  Neurological:     Mental Status: She is alert and oriented to person, place, and time. Mental status is at baseline.  Psychiatric:        Attention and Perception: Attention and perception normal.        Mood and Affect: Mood and affect normal.        Speech: Speech normal.        Behavior: Behavior normal. Behavior is cooperative.        Thought Content: Thought content normal.        Cognition and Memory: Cognition and memory normal.        Judgment: Judgment is impulsive.    Review of Systems  All other systems reviewed and are negative.  Blood pressure (!) 142/102, pulse 93, temperature (!) 97.3 F (36.3 C), resp. rate 19, height 5\' 2"  (1.575 m), weight 108.9 kg, SpO2 100%. Body mass index is 43.9 kg/m.   Treatment Plan Summary: Daily contact with patient to assess and evaluate symptoms and progress in treatment Patient is denying any psychiatric symptoms, however she does continue to score on COWS protocol, we will continue with treatment at this time for further detox.  -Continue COWS protocol -Continue BuSpar  10 to 20 mg daily as needed for panic attacks -Continue Klonopin  0.5 mg daily for anxiety -Continue gabapentin  1000 mg 3 times daily for chronic pain -Continue tizanidine  4 mg 3 times daily for muscle spasms, tramadol  50 mg 3 times daily as needed for pain -Continue trazodone  50 mg at bedtime as needed for sleep   [employer], PA-C 07/21/2023, 3:47 PM

## 2023-07-21 NOTE — Progress Notes (Signed)
   07/21/23 0800  Clinical Opiate Withdrawal Scale (COWS)  Resting Pulse Rate 0  Sweating 0  Restlessness 0  Pupil Size 0  Bone or Joint Aches 0  Runny Nose or Tearing 0  GI Upset 0  Tremor 0  Yawning 0  Anxiety or Irritability 0  Gooseflesh Skin 0  COWS Total Score 0

## 2023-07-21 NOTE — Progress Notes (Signed)
   07/21/23 0900  Psych Admission Type (Psych Patients Only)  Admission Status Voluntary  Psychosocial Assessment  Patient Complaints None (patient states that she's feeling  "the best I've felt since I've been here".)  Eye Contact Fair  Facial Expression Other (Comment) (appropriate)  Affect Appropriate to circumstance  Speech Logical/coherent  Interaction Assertive  Motor Activity Slow  Appearance/Hygiene Unremarkable  Behavior Characteristics Cooperative;Appropriate to situation  Mood Pleasant (patient's goal for today is "to continue with the protocol I'm on and work on getting better to go home".)  Aggressive Behavior  Effect No apparent injury  Thought Process  Coherency WDL  Content WDL  Delusions None reported or observed  Perception WDL  Hallucination None reported or observed  Judgment WDL  Confusion None  Danger to Self  Current suicidal ideation? Denies  Agreement Not to Harm Self Yes  Description of Agreement Verbal  Danger to Others  Danger to Others None reported or observed

## 2023-07-21 NOTE — Progress Notes (Signed)
 Patient denies si,hi and avh. Patient received trazodone  for sleep prn.  07/20/23 1954  Psych Admission Type (Psych Patients Only)  Admission Status Voluntary  Psychosocial Assessment  Patient Complaints Anxiety  Eye Contact Fair  Facial Expression Animated  Affect Anxious  Speech Logical/coherent  Interaction Assertive  Motor Activity Slow  Appearance/Hygiene In scrubs  Behavior Characteristics Cooperative;Appropriate to situation  Mood Anxious;Depressed  Aggressive Behavior  Effect No apparent injury  Thought Process  Coherency WDL  Content WDL  Delusions None reported or observed  Perception WDL  Hallucination None reported or observed  Judgment WDL  Confusion None  Danger to Self  Current suicidal ideation?  (Denies)  Agreement Not to Harm Self Yes  Description of Agreement verbal  Danger to Others  Danger to Others None reported or observed

## 2023-07-21 NOTE — Progress Notes (Signed)
   07/21/23 1600  Clinical Opiate Withdrawal Scale (COWS)  Resting Pulse Rate 1  Sweating 0  Restlessness 0  Pupil Size 0  Bone or Joint Aches 0  Runny Nose or Tearing 0  GI Upset 0  Tremor 0  Yawning 0  Anxiety or Irritability 0  Gooseflesh Skin 0  COWS Total Score 1

## 2023-07-21 NOTE — Progress Notes (Signed)
   07/21/23 1946  Psych Admission Type (Psych Patients Only)  Admission Status Voluntary  Psychosocial Assessment  Patient Complaints None  Eye Contact Fair  Facial Expression Flat  Affect Appropriate to circumstance  Speech Logical/coherent  Interaction Assertive  Motor Activity Slow  Appearance/Hygiene Unremarkable  Behavior Characteristics Cooperative;Appropriate to situation  Mood Pleasant  Aggressive Behavior  Effect No apparent injury  Thought Process  Coherency WDL  Content WDL  Delusions None reported or observed  Perception WDL  Hallucination None reported or observed  Judgment WDL  Confusion None  Danger to Self  Current suicidal ideation? Denies  Agreement Not to Harm Self Yes  Description of Agreement verbal  Danger to Others  Danger to Others None reported or observed   No complaints or concerns voiced. Stated improvement. Denies SI, HI, AVH.

## 2023-07-22 ENCOUNTER — Other Ambulatory Visit: Payer: Self-pay

## 2023-07-22 DIAGNOSIS — F331 Major depressive disorder, recurrent, moderate: Secondary | ICD-10-CM | POA: Diagnosis not present

## 2023-07-22 MED ORDER — HYDROXYZINE HCL 25 MG PO TABS: 25.0000 mg | ORAL_TABLET | Freq: Three times a day (TID) | ORAL | 0 refills | Status: AC | PRN

## 2023-07-22 MED ORDER — TRAMADOL HCL 50 MG PO TABS: 50.0000 mg | ORAL_TABLET | Freq: Three times a day (TID) | ORAL | 0 refills | Status: AC | PRN

## 2023-07-22 MED ORDER — BUSPIRONE HCL 10 MG PO TABS: 10.0000 mg | ORAL_TABLET | Freq: Every day | ORAL | 0 refills | Status: AC | PRN

## 2023-07-22 MED ORDER — GABAPENTIN 100 MG PO CAPS: 1000.0000 mg | ORAL_CAPSULE | Freq: Three times a day (TID) | ORAL | 0 refills | Status: AC

## 2023-07-22 NOTE — BHH Suicide Risk Assessment (Signed)
 BHH INPATIENT:  Family/Significant Other Suicide Prevention Education  Suicide Prevention Education:  Patient Refusal for Family/Significant Other Suicide Prevention Education: The patient Audrey [name] has refused to provide written consent for family/significant other to be provided Family/Significant Other Suicide Prevention Education during admission and/or prior to discharge.  Physician notified.  SPE completed with pt, as pt refused to consent to family contact. SPI pamphlet provided to pt and pt was encouraged to share information with support network, ask questions, and talk about any concerns relating to SPE. Pt denies access to guns/firearms and verbalized understanding of information provided. Mobile Crisis information also provided to pt.  Randolm Butte 07/22/2023, 10:52 AM

## 2023-07-22 NOTE — Group Note (Signed)
 Recreation Therapy Group Note   Group Topic:Coping Skills  Group Date: 07/22/2023 Start Time: 1000 End Time: 1050 Facilitators: Deatrice Factor, LRT, CTRS Location:  Craft Room  Group Description: Mind Map.  Patient was provided a blank template of a diagram with 32 blank boxes in a tiered system, branching from the center (similar to a bubble chart). LRT directed patients to label the middle of the diagram "Coping Skills". LRT and patients then came up with 8 different coping skills as examples. Pt were directed to record their coping skills in the 2nd tier boxes closest to the center.  Patients would then share their coping skills with the group as LRT wrote them out. LRT gave a handout of 99 different coping skills at the end of group.   Goal Area(s) Addressed: Patients will be able to define "coping skills". Patient will identify new coping skills.  Patient will increase communication.   Affect/Mood: Appropriate   Participation Level: Active and Engaged   Participation Quality: Independent   Behavior: Appropriate, Calm, and Cooperative   Speech/Thought Process: Coherent   Insight: Good   Judgement: Good   Modes of Intervention: Education, Exploration, Worksheet, and Writing   Patient Response to Interventions:  Attentive, Engaged, Interested , and Receptive   Education Outcome:  Acknowledges education   Clinical Observations/Individualized Feedback: Audrey [name] was active in their participation of session activities and group discussion. Pt identified "journaling, talk to someone, art" as coping skills. Pt spontaneously contributed to group discussion. Pt interacted well with LRT and peers duration of session.    Plan: Continue to engage patient in RT group sessions 2-3x/week.   Deatrice Factor, LRT, CTRS 07/22/2023 11:27 AM

## 2023-07-22 NOTE — Discharge Summary (Signed)
 Physician Discharge Summary Note  Patient:  Audrey [name] is an 48 y.o., female MRN:  [id number] DOB:  December 30, 1975 Patient phone:  [phone] (home)  Patient address:   [address] [place] [address],  Total Time spent with patient: 1.5 hours  Date of Admission:  07/16/2023 Date of Discharge: 07/22/2023  Reason for Admission:  Patient is a 48 year old female who currently lives with her husband has significant challenges with opioid dependence patient reports that she was on morphine  30 mg p.o. twice daily for significant amount of time about a week ago she reports that she ran out because she was only given 30-day supply with her psychiatrist or a provider who was retiring she is is significantly dysphoric and in active withdrawal very subjective presentation demanding for medications immediately patient is denying any symptoms of mania hypomania psycho   Principal Problem: MDD (major depressive disorder) Discharge Diagnoses: Principal Problem:   MDD (major depressive disorder) Active Problems:   Opioid use with withdrawal (HCC)   Past Psychiatric History: see H&P  Past Medical History:  Past Medical History:  Diagnosis Date   Anxiety    Arthritis    DDD (degenerative disc disease), lumbar 12/10/2020   Depression    Heart murmur    Pseudotumor cerebri    PTSD (post-traumatic stress disorder)    Schizo affective schizophrenia (HCC)    Sciatica     Past Surgical History:  Procedure Laterality Date   ABDOMINAL HYSTERECTOMY     ABDOMINAL SURGERY     APPENDECTOMY     CARDIAC CATHETERIZATION     CHOLECYSTECTOMY     COLONOSCOPY WITH PROPOFOL  N/A 02/18/2023   Procedure: COLONOSCOPY WITH PROPOFOL ;  Surgeon: Marnee Sink, MD;  Location: [facility] ENDOSCOPY;  Service: Endoscopy;  Laterality: N/A;   ESOPHAGOGASTRODUODENOSCOPY (EGD) WITH PROPOFOL  N/A 02/18/2023   Procedure: ESOPHAGOGASTRODUODENOSCOPY (EGD) WITH PROPOFOL ;  Surgeon: Marnee Sink, MD;   Location: [facility] ENDOSCOPY;  Service: Endoscopy;  Laterality: N/A;   KNEE SURGERY     TONSILLECTOMY     TUBAL LIGATION     Family History:  Family History  Problem Relation Age of Onset   Bipolar disorder Mother    Schizophrenia Sister    Schizophrenia Sister    Anxiety disorder Other    Depression Other    Suicidality Neg Hx    Family Psychiatric  History: see H&P Social History:  Social History   Substance and Sexual Activity  Alcohol Use Never     Social History   Substance and Sexual Activity  Drug Use Never    Social History   Socioeconomic History   Marital status: Married    Spouse name: Not on file   Number of children: Not on file   Years of education: Not on file   Highest education level: Not on file  Occupational History   Not on file  Tobacco Use   Smoking status: Never   Smokeless tobacco: Never  Vaping Use   Vaping status: Never Used  Substance and Sexual Activity   Alcohol use: Never   Drug use: Never   Sexual activity: Yes  Other Topics Concern   Not on file  Social History Narrative   Not on file   Social Drivers of Health   Financial Resource Strain: Low Risk  (11/10/2022)   Received from [facility]   Overall Financial Resource Strain (CARDIA)    Difficulty of Paying Living Expenses: Not hard at all  Food Insecurity: No  Food Insecurity (07/17/2023)   Hunger Vital Sign    Worried About Running Out of Food in the Last Year: Never true    Ran Out of Food in the Last Year: Never true  Transportation Needs: No Transportation Needs (07/17/2023)   PRAPARE - Administrator, Civil Service (Medical): No    Lack of Transportation (Non-Medical): No  Physical Activity: Sufficiently Active (11/10/2022)   Received from [facility]   Exercise Vital Sign    Days of Exercise per Week: 5 days    Minutes of Exercise per Session: 30 min  Stress: Stress Concern Present (11/10/2022)   Received from [facility] of  Occupational Health - Occupational Stress Questionnaire    Feeling of Stress : Very much  Social Connections: Unknown (07/17/2023)   Social Connection and Isolation Panel [NHANES]    Frequency of Communication with Friends and Family: Three times a week    Frequency of Social Gatherings with Friends and Family: Never    Attends Religious Services: 1 to 4 times per year    Active Member of [employer] or Organizations: Yes    Attends Banker Meetings: Never    Marital Status: Not on file    Hospital Course:   During the course of hospitalization, pt received daily multiple modalities of treatments consisting of Psychopharmacology, individual, group, psychoeducational, recreational, milieu therapy, including case management to coordinate pts inpatient and outpatient care and in concert with weekly treatment team meetings. Discharge planning was initiated on the day of admission to ensure a safe discharge. The presenting symptoms were closely monitored and medications were started as indicated. There were no complications. The principal reasons for hospitalization consisted of adjustment disorder with disturbance of conduct, mixed depression, opioid dependence  Medications addressing the principal problem were initiated with improvement in severity sufficient to discharge to a lower level of care. Patient was started on COWs protocol, clonidine  taper to help with withdrawal symptoms, as needed Imodium , naproxen , Robaxin , Naproxen  for withdrawal symptoms.  She was restarted on home medications BuSpar  10 to 20 mg daily as needed for panic attacks, Klonopin  0.5 mg daily for 25, gabapentin  1000 mg 3 times daily for mood/chronic pain, tizanidine  4 mg 3 times daily, tramadol  50 mg 3 times daily as needed for pain. Patient was also given PRN medications trazodone  50mg  HS for insomnia, which was up titrated to 150 mg at bedtime with poor efficacy.  Patient was restarted on home medication Remeron  7.5 mg at  bedtime for insomnia.  It is intended for the outpatient provider to determine whether to continue these medications, or if these medication needs to be titrated for continued outpatient therapy.  All identified psychiatric, general medical/surgical psychosocial obstacles to discharge were addressed. Patient tolerated these medications with no noted side effects. All these medications were titrated to discharge levels (Please see discharge medications below). Patient showed slow but steady and sustained symptomatic improvement before discharge. The patient denied suicidal, homicidal ideations and hallucinations. Family session held to determine baseline behaviors and for safe discharge plan.  Did speak with her husband, Cherise Cornelia, reports that he has no concerns about her returning home.  There are no weapons or firearms in the home.  He will not be able to pick her up, would like to have her cabbed home.  States that prior to coming into the hospital this time the patient did not attempt to harm herself, but did threatening to harm herself verbally.  On the  day of discharge 07/22/2023, following sustained improvement in the affect of this patient, continued report of euthymic mood, repeated denial of suicidal, homicidal and other violent ideations, adequate interaction with peers, active participation in groups while on the unit, and denial of adverse reactions from the medications, the treatment team decided that Audrey [name], Audrey [name] was stable for discharge back  home with scheduled mental health treatment as below. A comprehensive risk assessment was done prior to discharge and shows that patient is at low risk for suicide or violence and will continue to be if patient complies with the treatment recommendations, medications and therapy.  At the time of discharge, patient no longer meeting criteria for IVC, patient is not an imminent danger to self or others. patient agrees to call Crisis Services, 911 and/or  return to the ED if safety cannot be maintained outside the hospital setting. Discharge medications reviewed with patient, explanation of indication, risks/benefits and side effects profiles. The patient verbalized understanding and is in agreement with the discharge plan.  Physical Findings: AIMS: Facial and Oral Movements Muscles of Facial Expression: None Lips and Perioral Area: None Jaw: None Tongue: None,Extremity Movements Upper (arms, wrists, hands, fingers): None Lower (legs, knees, ankles, toes): None, Trunk Movements Neck, shoulders, hips: None, Global Judgements Severity of abnormal movements overall : None Incapacitation due to abnormal movements: None Patient's awareness of abnormal movements: No Awareness, Dental Status Current problems with teeth and/or dentures?: No Does patient usually wear dentures?: No Edentia?: No  CIWA:    COWS:  COWS Total Score: 1  Musculoskeletal: Strength & Muscle Tone: within normal limits Gait & Station: normal Patient leans: N/A   Psychiatric Specialty Exam:  Presentation  General Appearance:  Appropriate for Environment  Eye Contact: Good  Speech: Clear and Coherent  Speech Volume: Normal  Handedness:No data recorded  Mood and Affect  Mood: Euthymic  Affect: Congruent   Thought Process  Thought Processes: Coherent; Linear  Descriptions of Associations:Intact  Orientation:Full (Time, Place and Person)  Thought Content:WDL  History of Schizophrenia/Schizoaffective disorder:No  Duration of Psychotic Symptoms:No data recorded Hallucinations:Hallucinations: None  Ideas of Reference:None  Suicidal Thoughts:Suicidal Thoughts: No  Homicidal Thoughts:Homicidal Thoughts: No   Sensorium  Memory: Immediate Good  Judgment: Fair  Insight: Fair   Executive Functions  Concentration: Good  Attention Span: Good  Recall: Fair  Fund of Knowledge: Fair  Language: Fair   Psychomotor Activity   Psychomotor Activity: Psychomotor Activity: Normal   Assets  Assets: Manufacturing systems engineer; Housing; Intimacy; Leisure Time; Social Support   Sleep  Sleep: Sleep: Poor    Physical Exam: Physical Exam Vitals and nursing note reviewed.  Constitutional:      Appearance: She is obese.  Eyes:     Extraocular Movements: Extraocular movements intact.  Pulmonary:     Effort: Pulmonary effort is normal.  Neurological:     Mental Status: She is alert and oriented to person, place, and time.  Psychiatric:        Attention and Perception: Attention and perception normal.        Mood and Affect: Mood and affect normal.        Speech: Speech normal.        Behavior: Behavior normal. Behavior is cooperative.        Thought Content: Thought content normal.        Cognition and Memory: Cognition and memory normal.        Judgment: Judgment is impulsive.    ROS Blood pressure (!) 118/92, pulse  83, temperature (!) 97.3 F (36.3 C), resp. rate 20, height 5\' 2"  (1.575 m), weight 108.9 kg, SpO2 100%. Body mass index is 43.9 kg/m.   Social History   Tobacco Use  Smoking Status Never  Smokeless Tobacco Never   Tobacco Cessation:  N/A, patient does not currently use tobacco products   Blood Alcohol level:  Lab Results  Component Value Date   ETH <10 07/16/2023   ETH <10 06/22/2021    Metabolic Disorder Labs:  Lab Results  Component Value Date   HGBA1C 4.8 08/15/2021   MPG 91.06 08/15/2021   No results found for: "PROLACTIN" Lab Results  Component Value Date   CHOL 219 (H) 08/15/2021   TRIG 203 (H) 08/15/2021   HDL 48 08/15/2021   CHOLHDL 4.6 08/15/2021   VLDL 41 (H) 08/15/2021   LDLCALC 130 (H) 08/15/2021    See Psychiatric Specialty Exam and Suicide Risk Assessment completed by Attending Physician prior to discharge.  Discharge destination:  Home  Is patient on multiple antipsychotic therapies at discharge:  No   Has Patient had three or more failed trials of  antipsychotic monotherapy by history:  No  Recommended Plan for Multiple Antipsychotic Therapies: NA  Discharge Instructions     Diet - low sodium heart healthy   Complete by: As directed    Increase activity slowly   Complete by: As directed       Allergies as of 07/22/2023       Reactions   Other Anaphylaxis, Swelling   Kdc:Red Dye+Yellow Dye+Nitroglycerin+Brilliant Blue Fcf   Penicillins Anaphylaxis, Rash, Hives   Tolerates ceftriaxone    Prochlorperazine Anaphylaxis, Nausea And Vomiting   Propoxyphene Nausea And Vomiting, Other (See Comments)   Acetazolamide  Other (See Comments)   Joint pain   Latex Hives, Swelling   Nitroglycerin Er Swelling, Other (See Comments)   Body swells, but no breathing issues        Medication List     STOP taking these medications    gabapentin  800 MG tablet Commonly known as: NEURONTIN  Replaced by: gabapentin  100 MG capsule       TAKE these medications      Indication  albuterol  108 (90 Base) MCG/ACT inhaler Commonly known as: VENTOLIN  HFA Inhale 2 puffs into the lungs every 6 (six) hours as needed for wheezing or shortness of breath.  Indication: Asthma   busPIRone  10 MG tablet Commonly known as: BUSPAR  Take 1-2 tablets (10-20 mg total) by mouth daily as needed (Panic Attack).  Indication: Anxiety Disorder   clonazePAM  0.5 MG tablet Commonly known as: KLONOPIN  Take 0.5 mg by mouth daily.  Indication: Feeling Anxious   gabapentin  100 MG capsule Commonly known as: NEURONTIN  Take 10 capsules (1,000 mg total) by mouth 3 (three) times daily. Replaces: gabapentin  800 MG tablet  Indication: Neuropathic Pain   hydrOXYzine  25 MG tablet Commonly known as: ATARAX  Take 1 tablet (25 mg total) by mouth 3 (three) times daily as needed.  Indication: Feeling Anxious   ondansetron  4 MG disintegrating tablet Commonly known as: ZOFRAN -ODT Take 1 tablet (4 mg total) by mouth every 8 (eight) hours as needed for nausea or vomiting.   Indication: Nausea and Vomiting   tiZANidine  4 MG tablet Commonly known as: ZANAFLEX  Take 4 mg by mouth 3 (three) times daily.  Indication: Musculoskeletal Pain   traMADol  50 MG tablet Commonly known as: ULTRAM  Take 1 tablet (50 mg total) by mouth 3 (three) times daily as needed for up to 7 days for  moderate pain (pain score 4-6). What changed: reasons to take this  Indication: Pain   verapamil  240 MG CR tablet Commonly known as: CALAN -SR Take 240 mg by mouth daily.  Indication: as directed        Follow-up Information     [facility], [facility]. Go to.   Why: Your appointment is scheduled for Monday, 07/26/23 at 11AM. Thanks! Contact information: [address] [place] [address] [phone]                 Follow-up recommendations:   # It is recommended to the patient to continue psychiatric medications as prescribed, after discharge from the hospital.   # It is recommended to the patient to follow up with your outpatient psychiatric provider and PCP. # It was discussed with the patient, the impact of alcohol, drugs, tobacco have been there overall psychiatric and medical wellbeing, and total abstinence from substance use was recommended. # Prescriptions provided or sent directly to preferred pharmacy at discharge. Patient agreeable to plan. Given the opportunity to ask questions. Appears to feel comfortable with discharge.  # In the event of worsening symptoms, the patient is instructed to call the crisis hotline (988), 911 and or go to the nearest ED for appropriate evaluation and treatment of symptoms. To follow-up with primary care provider for other medical issues, concerns and or health care needs # Patient was discharged home with a plan to follow up as noted above.    SignedSheilda Deputy, PA-C 07/22/2023, 10:40 AM

## 2023-07-22 NOTE — Progress Notes (Signed)
  [facility] Adult Case Management Discharge Plan :  Will you be returning to the same living situation after discharge:  Yes,  pt plans to return home upon discharge.  At discharge, do you have transportation home?: Yes,  pt provided with taxi voucher.  Do you have the ability to pay for your medications: Yes,  BLUE CROSS BLUE SHIELD / BCBS COMM PPO  Release of information consent forms completed and in the chart;  Patient's signature needed at discharge.  Patient to Follow up at:  Follow-up Information     [facility], [facility]. Go to.   Why: Your appointment is scheduled for Monday, 07/26/23 at 11AM. Thanks! Contact information: [address] [place] [address] [phone]                 Next level of care provider has access to [facility] Link:no  Safety Planning and Suicide Prevention discussed: Yes,  SPE completed with pt.      Has patient been referred to the Quitline?: Patient does not use tobacco/nicotine products  Patient has been referred for addiction treatment: No known substance use disorder.  Randolm Butte, LCSW 07/22/2023, 10:50 AM

## 2023-07-22 NOTE — [facility] Suicide Risk Assessment (Signed)
 Suicide Risk Assessment  Discharge Assessment    [facility] Discharge Suicide Risk Assessment   Principal Problem: MDD (major depressive disorder) Discharge Diagnoses: Principal Problem:   MDD (major depressive disorder) Active Problems:   Opioid use with withdrawal (HCC)   Total Time spent with patient: 1.5 hours  Musculoskeletal: Strength & Muscle Tone: within normal limits Gait & Station: normal Patient leans: N/A  Psychiatric Specialty Exam  Presentation  General Appearance:  Appropriate for Environment  Eye Contact: Good  Speech: Clear and Coherent  Speech Volume: Normal  Handedness:No data recorded  Mood and Affect  Mood: Euthymic  Duration of Depression Symptoms: Greater than two weeks  Affect: Congruent   Thought Process  Thought Processes: Coherent; Linear  Descriptions of Associations:Intact  Orientation:Full (Time, Place and Person)  Thought Content:WDL  History of Schizophrenia/Schizoaffective disorder:No  Duration of Psychotic Symptoms:No data recorded Hallucinations:Hallucinations: None  Ideas of Reference:None  Suicidal Thoughts:Suicidal Thoughts: No  Homicidal Thoughts:Homicidal Thoughts: No   Sensorium  Memory: Immediate Good  Judgment: Fair  Insight: Fair   Executive Functions  Concentration: Good  Attention Span: Good  Recall: Fair  Fund of Knowledge: Fair  Language: Fair   Psychomotor Activity  Psychomotor Activity: Psychomotor Activity: Normal   Assets  Assets: Manufacturing systems engineer; Housing; Intimacy; Leisure Time; Social Support   Sleep  Sleep: Sleep: Poor   Physical Exam: Physical Exam Vitals and nursing note reviewed.  Psychiatric:        Attention and Perception: Attention and perception normal.        Mood and Affect: Mood and affect normal.        Speech: Speech normal.        Behavior: Behavior normal. Behavior is cooperative.        Thought Content: Thought content normal.         Cognition and Memory: Cognition and memory normal.        Judgment: Judgment is impulsive.    ROS Blood pressure (!) 118/92, pulse 83, temperature (!) 97.3 F (36.3 C), resp. rate 20, height 5\' 2"  (1.575 m), weight 108.9 kg, SpO2 100%. Body mass index is 43.9 kg/m.  Mental Status Per Nursing Assessment::   On Admission:  Suicidal ideation indicated by patient  Demographic Factors:  Caucasian and Low socioeconomic status  Loss Factors: Decline in physical health  Historical Factors: Prior suicide attempts and Impulsivity  Risk Reduction Factors:   Living with another person, especially a relative and Positive social support  Continued Clinical Symptoms:  Alcohol/Substance Abuse/Dependencies Medical Diagnoses and Treatments/Surgeries  Cognitive Features That Contribute To Risk:  None    Suicide Risk:  Minimal: No identifiable suicidal ideation.  Patients presenting with no risk factors but with morbid ruminations; may be classified as minimal risk based on the severity of the depressive symptoms   Follow-up Information     [facility], [facility]. Go to.   Why: Your appointment is scheduled for Monday, 07/26/23 at 11AM. Thanks! Contact information: [address] [place] [address] [phone]                 Plan Of Care/Follow-up recommendations:  # It is recommended to the patient to continue psychiatric medications as prescribed, after discharge from the hospital.   # It is recommended to the patient to follow up with your outpatient psychiatric provider and PCP. # It was discussed with the patient, the impact of alcohol, drugs, tobacco have been there overall psychiatric and medical wellbeing, and total abstinence from  substance use was recommended. # Prescriptions provided or sent directly to preferred pharmacy at discharge. Patient agreeable to plan. Given the opportunity to ask questions. Appears to feel comfortable with discharge.  # In  the event of worsening symptoms, the patient is instructed to call the crisis hotline (988), 911 and or go to the nearest ED for appropriate evaluation and treatment of symptoms. To follow-up with primary care provider for other medical issues, concerns and or health care needs # Patient was discharged home with a plan to follow up as noted above.    Charese Abundis, PA-C 07/22/2023, 10:40 AM

## 2023-07-22 NOTE — Group Note (Signed)
 Date:  07/22/2023 Time:  1:26 AM  Group Topic/Focus:  Wrap-Up Group:   The focus of this group is to help patients review their daily goal of treatment and discuss progress on daily workbooks.    Participation Level:  Active  Participation Quality:  Appropriate  Affect:  Appropriate  Cognitive:  Appropriate  Insight: Appropriate  Engagement in Group:  Engaged  Modes of Intervention:  Discussion   Bradley Caffey 07/22/2023, 1:26 AM

## 2023-07-22 NOTE — Progress Notes (Signed)
Patient pleasant and cooperative on approach. Denies SI,HI and AVH. Verbalized understanding discharge instructions,prescriptions and follow up care.  All belongings returned from BMU locker. Suicide safety plan filled by patient and placed in chart. Copy given to patient.Patient escorted out by staff and transported by family. 

## 2023-08-17 IMAGING — CT CT HEAD W/O CM
4 series · 16 of 47 positions shown, 18 images · non-contrast
Comparison: 12/02/2020

CLINICAL DATA: Head trauma, altered mental status

EXAM:
CT HEAD WITHOUT CONTRAST
TECHNIQUE: Contiguous axial images were obtained from the base of the skull
through the vertex without intravenous contrast.

[Series 2: head wo · axial · 0.41mm/px · z∈[+1177,+1292]mm · 7 of 31 slices shown, 9 images]
[im 4/31  brain]
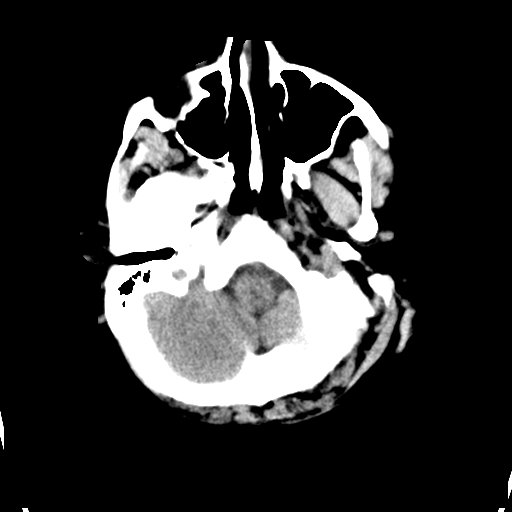
[im 4/31  bone]
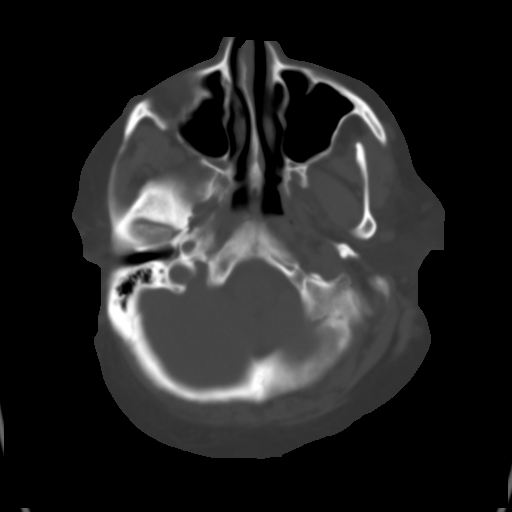
[im 8/31  brain]
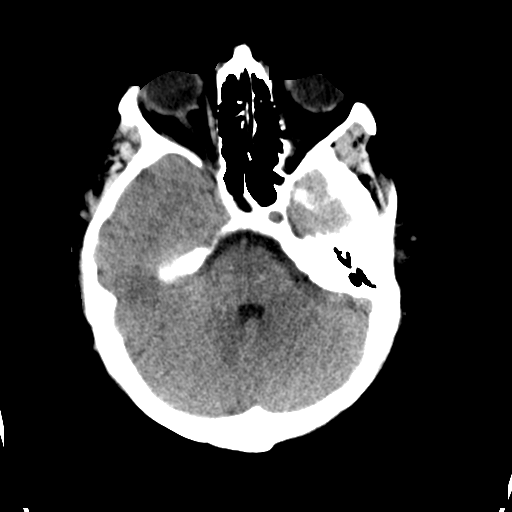
[im 12/31  brain]
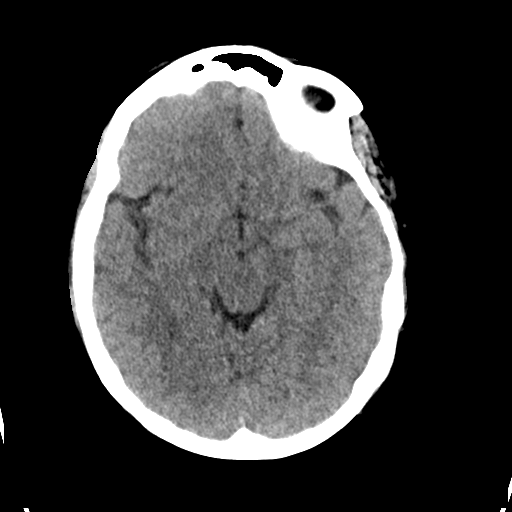
[im 16/31  brain]
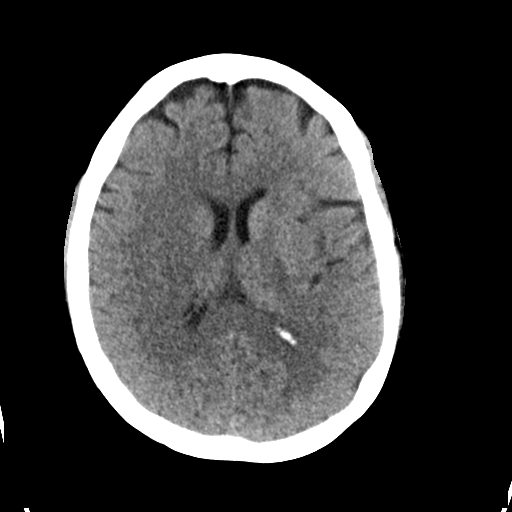
[im 19/31  brain]
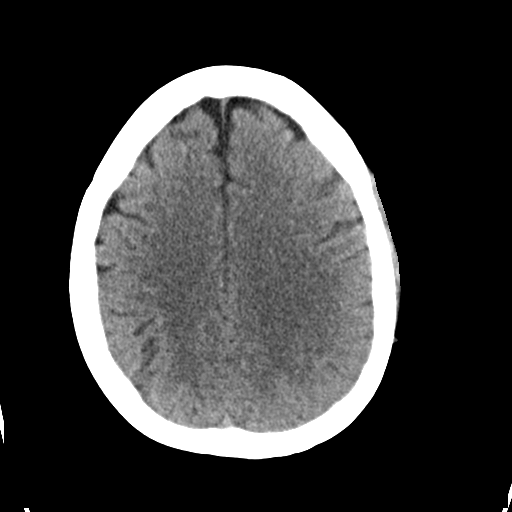
[im 19/31  bone]
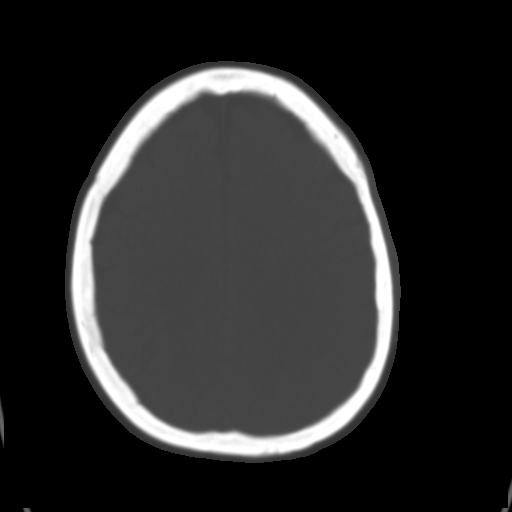
[im 23/31  brain]
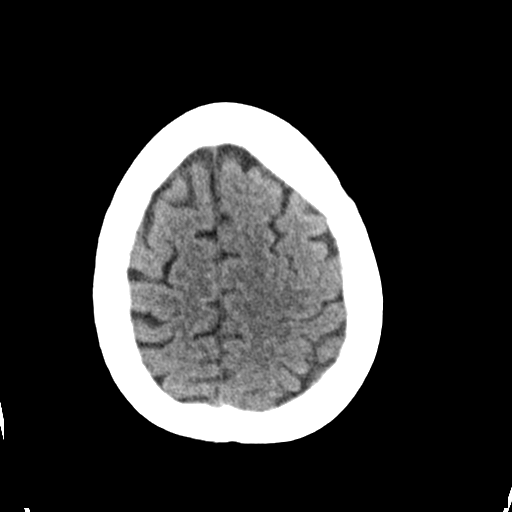
[im 27/31  brain]
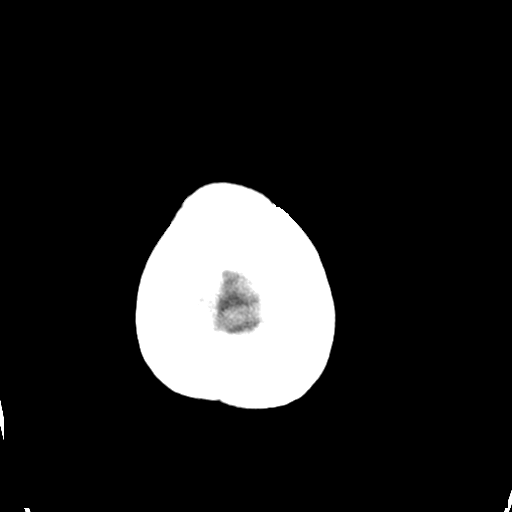

[Series 3: head bone · axial · 0.41mm/px · z∈[+1176,+1208]mm · 3 of 78 slices shown]
[im 8/78  bone]
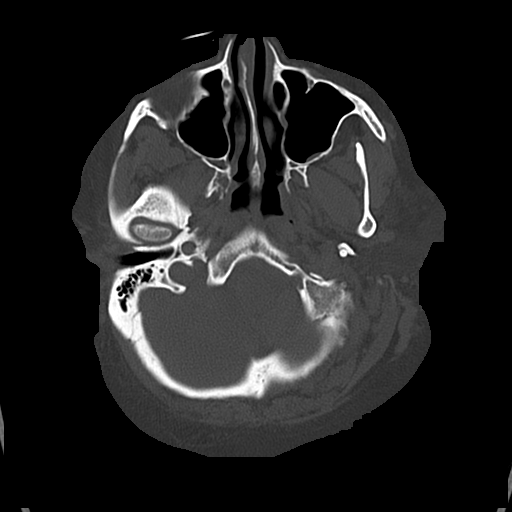
[im 16/78  bone]
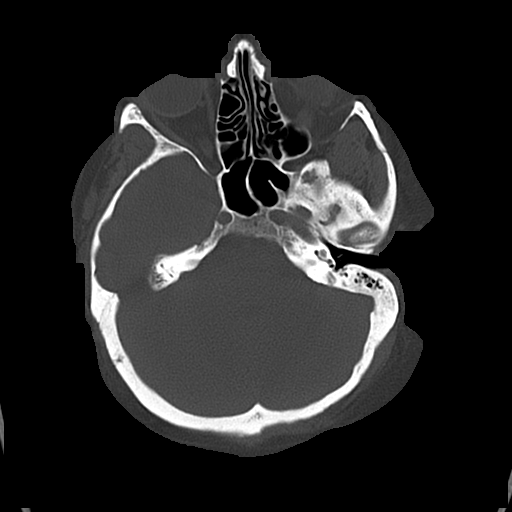
[im 24/78  bone]
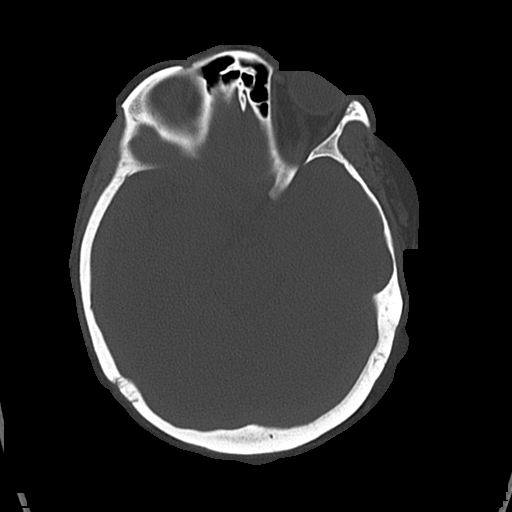

[Series 4: coronal soft · coronal · 0.29mm/px · 3 of 65 slices shown]
[im 22/65  brain]
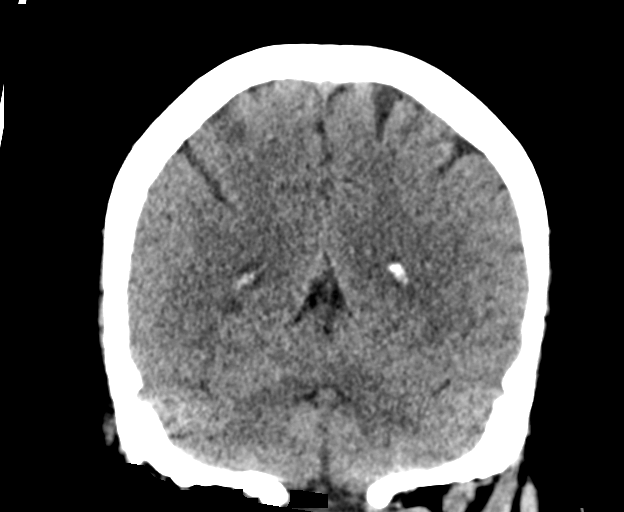
[im 29/65  brain]
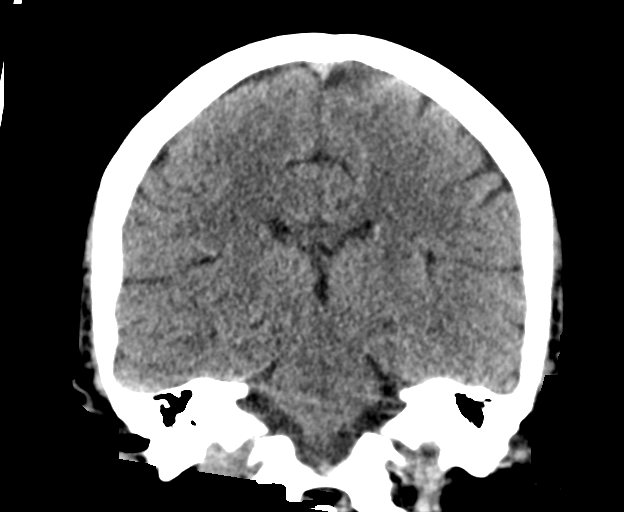
[im 36/65  brain]
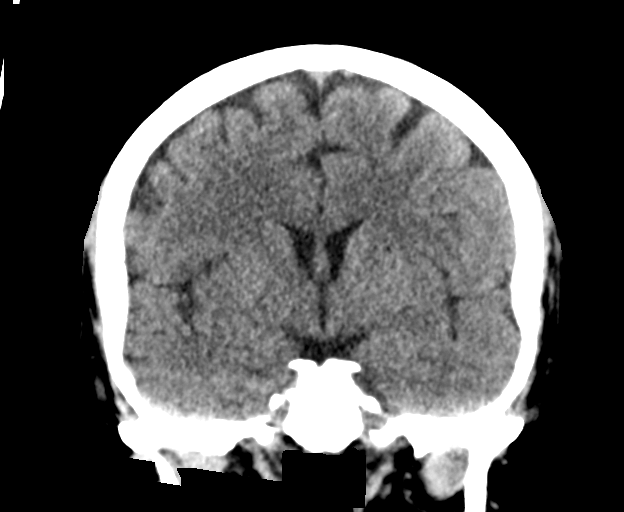

[Series 5: sagittal soft · sagittal · 0.29mm/px · 3 of 61 slices shown]
[im 24/61  brain]
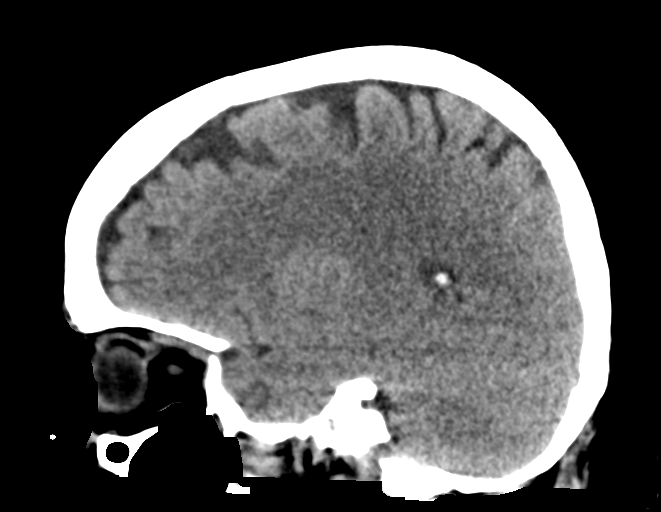
[im 31/61  brain]
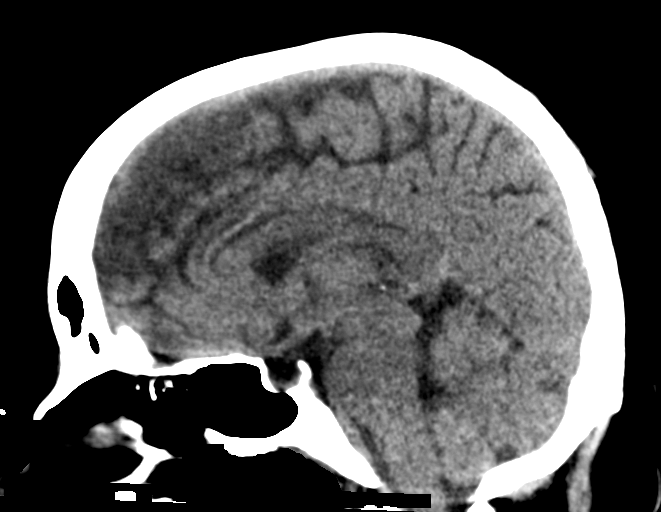
[im 38/61  brain]
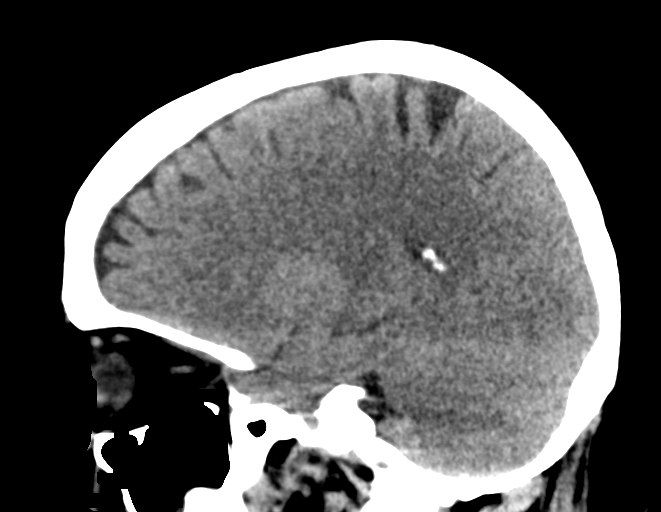

[16 of 47 positions shown; findings below may reference images not displayed]

FINDINGS: Brain: No evidence of acute infarction, hemorrhage, hydrocephalus,
extra-axial collection or mass lesion/mass effect.

Vascular: No hyperdense vessel or unexpected calcification.

Skull: Normal. Negative for fracture or focal lesion.

Sinuses/Orbits: Orbits unremarkable. Mild maxillary sinus mucosal
thickening. No sinus air-fluid level.

Other: None.
IMPRESSION: No acute intracranial abnormality. Normal head CT without contrast
for age.

Minor maxillary sinus disease.

## 2023-12-14 ENCOUNTER — Other Ambulatory Visit: Payer: Self-pay | Admitting: Medical Genetics

## 2023-12-29 ENCOUNTER — Emergency Department

## 2023-12-29 ENCOUNTER — Emergency Department
Admission: EM | Admit: 2023-12-29 | Discharge: 2023-12-29 | Disposition: A | Discharge status: Home / Self Care | Attending: Emergency Medicine | Admitting: Emergency Medicine

## 2023-12-29 ENCOUNTER — Encounter: Payer: Self-pay | Admitting: Intensive Care

## 2023-12-29 ENCOUNTER — Other Ambulatory Visit: Payer: Self-pay

## 2023-12-29 DIAGNOSIS — I10 Essential (primary) hypertension: Secondary | ICD-10-CM | POA: Insufficient documentation

## 2023-12-29 DIAGNOSIS — W19XXXA Unspecified fall, initial encounter: Secondary | ICD-10-CM | POA: Diagnosis not present

## 2023-12-29 DIAGNOSIS — J45909 Unspecified asthma, uncomplicated: Secondary | ICD-10-CM | POA: Diagnosis not present

## 2023-12-29 DIAGNOSIS — R202 Paresthesia of skin: Secondary | ICD-10-CM | POA: Diagnosis not present

## 2023-12-29 DIAGNOSIS — S8991XA Unspecified injury of right lower leg, initial encounter: Secondary | ICD-10-CM | POA: Diagnosis present

## 2023-12-29 DIAGNOSIS — S8992XA Unspecified injury of left lower leg, initial encounter: Secondary | ICD-10-CM

## 2023-12-29 HISTORY — DX: Essential (primary) hypertension: I10

## 2023-12-29 HISTORY — DX: Unspecified asthma, uncomplicated: J45.909

## 2023-12-29 MED ORDER — CYCLOBENZAPRINE HCL 10 MG PO TABS: 10.0000 mg | ORAL_TABLET | Freq: Three times a day (TID) | ORAL | 0 refills | Status: AC | PRN

## 2023-12-29 MED ORDER — MORPHINE SULFATE (PF) 4 MG/ML IV SOLN
4.0000 mg | Freq: Once | INTRAVENOUS | Status: AC
  Administered 2023-12-29: 4 mg via INTRAVENOUS
  Filled 2023-12-29: qty 1

## 2023-12-29 MED ORDER — ONDANSETRON HCL 4 MG/2ML IJ SOLN
4.0000 mg | Freq: Once | INTRAMUSCULAR | Status: AC
  Administered 2023-12-29: 4 mg via INTRAVENOUS
  Filled 2023-12-29: qty 2

## 2023-12-29 MED ORDER — KETOROLAC TROMETHAMINE 15 MG/ML IJ SOLN
15.0000 mg | Freq: Once | INTRAMUSCULAR | Status: AC
  Administered 2023-12-29: 15 mg via INTRAVENOUS
  Filled 2023-12-29: qty 1

## 2023-12-29 MED ORDER — NAPROXEN 500 MG PO TBEC: 500.0000 mg | DELAYED_RELEASE_TABLET | Freq: Two times a day (BID) | ORAL | 0 refills | Status: AC

## 2023-12-29 NOTE — ED Triage Notes (Signed)
 Reports mechanical X2 days ago and fell onto right knee. Knee is swollen and painful

## 2023-12-29 NOTE — Discharge Instructions (Addendum)
 You have been diagnosed with left knee soft tissue injury.  X-ray showed no fracture.  Please take naproxen  1 tablet by mouth every 12 hours with meals.  Please take Flexeril 1 tablet by mouth every 8 hours.  You can apply lidocaine  patch on your right thigh.  Please elevate your right leg and apply ice pack.  Please call Dr. Tobie, orthopedic doctor, for a follow-up.  Please come back to ED or go to your PCP if you have new symptoms or symptoms worsen.  It was a pleasure to help you today.  Lorane Sar, [place]

## 2023-12-29 NOTE — ED Notes (Signed)
 RN attempted IV access x2 without success. Rn will ask 2nd RN to attempt

## 2023-12-29 NOTE — Consult Note (Signed)
 ORTHOPAEDIC CONSULTATION  REQUESTING PHYSICIAN: Waymond Lorelle Cummins, MD  Chief Complaint:   R knee pain  History of Present Illness: Audrey [name] is a 48 y.o. female who 2 days ago had a fall directly onto her R knee with a forced hyperflexion type episode. She has had significantly increased knee pain since that time with increased difficulty ambulation. She has also had significant numbness and tingling sensations about the R leg, worst over the foot. This started after the fall.   Of note, she is on disability from pseudotumor cerebri, degenerative disk disease. She lives at home with husband.   Past Medical History:  Diagnosis Date   Anxiety    Arthritis    Asthma    DDD (degenerative disc disease), lumbar 12/10/2020   Depression    Heart murmur    Hypertension    Pseudotumor cerebri    PTSD (post-traumatic stress disorder)    Schizo affective schizophrenia (HCC)    Sciatica    Past Surgical History:  Procedure Laterality Date   ABDOMINAL HYSTERECTOMY     ABDOMINAL SURGERY     APPENDECTOMY     CARDIAC CATHETERIZATION     CHOLECYSTECTOMY     COLONOSCOPY WITH PROPOFOL  N/A 02/18/2023   Procedure: COLONOSCOPY WITH PROPOFOL ;  Surgeon: Jinny Carmine, MD;  Location: [facility] ENDOSCOPY;  Service: Endoscopy;  Laterality: N/A;   ESOPHAGOGASTRODUODENOSCOPY (EGD) WITH PROPOFOL  N/A 02/18/2023   Procedure: ESOPHAGOGASTRODUODENOSCOPY (EGD) WITH PROPOFOL ;  Surgeon: Jinny Carmine, MD;  Location: [facility] ENDOSCOPY;  Service: Endoscopy;  Laterality: N/A;   KNEE SURGERY     TONSILLECTOMY     TUBAL LIGATION     Social History   Socioeconomic History   Marital status: Married    Spouse name: Not on file   Number of children: Not on file   Years of education: Not on file   Highest education level: Not on file  Occupational History   Not on file  Tobacco Use   Smoking status: Never   Smokeless tobacco: Never  Vaping Use    Vaping status: Never Used  Substance and Sexual Activity   Alcohol use: Never   Drug use: Never   Sexual activity: Yes  Other Topics Concern   Not on file  Social History Narrative   Not on file   Social Drivers of Health   Financial Resource Strain: Low Risk  (10/18/2023)   Received [facility]   Overall Financial Resource Strain (CARDIA)    How hard is it for you to pay for the very basics like food, housing, medical care, and heating?: Not very hard  Recent Concern: Financial Resource Strain - Medium Risk (08/02/2023)   Received from [facility]   Overall Financial Resource Strain (CARDIA)    Difficulty of Paying Living Expenses: Somewhat hard  Food Insecurity: Food Insecurity Present (10/18/2023)   Received from [facility]   Hunger Vital Sign    Within the past 12 months, you worried that your food would run out before you got the money to buy more.: Sometimes true    Within the past 12 months, the food you bought just didn't last and you didn't have money to get more.: Sometimes true  Transportation Needs: No Transportation Needs (10/18/2023)   Received from [facility] - Transportation    In the past 12 months, has lack of transportation kept you from medical appointments or from getting medications?: No    In the past 12 months, has lack of  transportation kept you from meetings, work, or from getting things needed for daily living?: No  Physical Activity: Sufficiently Active (10/18/2023)   Received from [facility]   Exercise Vital Sign    On average, how many days per week do you engage in moderate to strenuous exercise (like a brisk walk)?: 5 days    On average, how many minutes do you engage in exercise at this level?: 60 min  Stress: Stress Concern Present (10/18/2023)   Received from [facility] - Occupational Stress Questionnaire    Do you feel stress - tense, restless, nervous, or anxious, or unable to  sleep at night because your mind is troubled all the time - these days?: Very much  Social Connections: Socially Integrated (10/18/2023)   Received from [facility]   Social Network    How would you rate your social network (family, work, friends)?: Good participation with social networks   Family History  Problem Relation Age of Onset   Bipolar disorder Mother    Schizophrenia Sister    Schizophrenia Sister    Anxiety disorder Other    Depression Other    Suicidality Neg Hx    Allergies  Allergen Reactions   Other Anaphylaxis and Swelling    Kdc:Red Dye+Yellow Dye+Nitroglycerin+Brilliant Blue Fcf   Penicillins Anaphylaxis, Rash and Hives    Tolerates ceftriaxone    Prochlorperazine Anaphylaxis and Nausea And Vomiting   Propoxyphene Nausea And Vomiting and Other (See Comments)   Acetazolamide  Other (See Comments)    Joint pain   Latex Hives and Swelling   Nitroglycerin Er Swelling and Other (See Comments)    Body swells, but no breathing issues   Prior to Admission medications   Medication Sig Start Date End Date Taking? Authorizing Provider  albuterol  (VENTOLIN  HFA) 108 (90 Base) MCG/ACT inhaler Inhale 2 puffs into the lungs every 6 (six) hours as needed for wheezing or shortness of breath. 04/10/22   Dorothyann Drivers, MD  clonazePAM  (KLONOPIN ) 0.5 MG tablet Take 0.5 mg by mouth daily. 06/15/23   [provider]  gabapentin  (NEURONTIN ) 100 MG capsule Take 10 capsules (1,000 mg total) by mouth 3 (three) times daily. 07/22/23   Tingling, Corean, PA-C  hydrOXYzine  (ATARAX ) 25 MG tablet Take 1 tablet (25 mg total) by mouth 3 (three) times daily as needed. 07/22/23   Tingling, Corean, PA-C  ondansetron  (ZOFRAN -ODT) 4 MG disintegrating tablet Take 1 tablet (4 mg total) by mouth every 8 (eight) hours as needed for nausea or vomiting. 07/12/23   Cyrena Mylar, MD  tiZANidine  (ZANAFLEX ) 4 MG tablet Take 4 mg by mouth 3 (three) times daily.    [provider]  verapamil   (CALAN -SR) 240 MG CR tablet Take 240 mg by mouth daily. 06/30/23   [provider]   No results for input(s): WBC, HGB, HCT, PLT, K, CL, CO2, BUN, CREATININE, GLUCOSE, CALCIUM, LABPT, INR in the last 72 hours. DG Knee Complete 4 Views Right Result Date: 12/29/2023 CLINICAL DATA:  [id number] Fall 809823 EXAM: RIGHT KNEE - COMPLETE 4+ VIEW COMPARISON:  05/23/2021 FINDINGS: No acute fracture or dislocation. No joint effusion. Mild joint space loss of the medial and lateral compartments. Moderate patellofemoral joint space loss. Tricompartmental osteophyte formation. Soft tissues are unremarkable. IMPRESSION: 1. No acute fracture or dislocation. 2. Moderate osteoarthritis of the knee, slightly progressed in the interim. Electronically Signed   By: Rogelia Myers M.D.   On: 12/29/2023 16:33     Positive ROS:  All other systems have been reviewed and were otherwise negative with the exception of those mentioned in the HPI and as above.  Physical Exam: BP (!) 109/56 (BP Location: Right Arm)   Pulse (!) 120   Temp 99.3 F (37.4 C) (Oral)   Resp 18   Ht 5' 3 (1.6 m)   Wt 117.9 kg   SpO2 98%   BMI 46.06 kg/m  General:  Alert, no acute distress Psychiatric:  Patient is competent for consent with normal mood and affect     Orthopedic Exam:  RLE: 5/5 DF/PF/EHL Significant numbness s/s/t/sp/dp distr over entire foot. Also has numbness about entire distal calf and lateral leg. Tingling sensations about remainder of medial leg and anterior shin.  Foot wwp RoM: 0-90 with pain Able to perform straight leg raise with ~10 deg extensor lag No palpable gap about quadriceps insertion or patellar insertion on patella.     Imaging:  As above:  - NWB radiographs of the knee show medial compartment degenerative changes with osteophyte formation. No notable patella alta or [place]. No fractures or dislocations noted.  - On personal read of the MRI, there are no  fractures/dislocations. There are no ligamentous tears. There is no extensor mechanism disruption. Lateral meniscus appears normal. There is slight irregularity of the medial meniscus with possible tearing. There are severe tricompartmental degenerative changes. No notable abnormal masses.   Assessment/Plan: 48 yo F R knee pain as well as numbness/tingling about RLE.  - No acute surgical intervention planned. Pain likely due to severe degenerative changes. Unknown current explanation of RLE numbness/tingling. No orthopedic reason for admission.  - Will defer disposition to ED team. Findings communicated to them. Numbness/tingling and further knee pain can be worked up as an outpatient. She can follow up with us  next week if still symptomatic.      Earnestine Blanch   12/29/2023 6:20 PM

## 2023-12-29 NOTE — ED Provider Notes (Cosign Needed Addendum)
 [facility] Provider Note    Event Date/Time   First MD Initiated Contact with Patient 12/29/23 1547     (approximate)   History   Knee Pain    HPI  Audrey [name] is a 48 y.o. female    with a past medical history of schizoaffective disorder, GAD, migraine, chronic pain syndrome,, with no significant past medical history who presents to the ED complaining of right knee pain. According to the patient, 2 days ago she stepped on her falling  her right knee.  Patient endorses swelling, constant pain, unable to sleep because of the pain.  Patient reports she can walk on her tippy toes but it is painful.  Patient reports feeling numbness on her right foot. Patient endorses she had 4 surgeries in the right knee.  Patient just moved from [place] , she needs to establish care with pain management, cardiology and orthopedics.    Patient Active Problem List   Diagnosis Date Noted   Opioid use with withdrawal (HCC) 07/16/2023   MDD (major depressive disorder) 07/16/2023   Colon cancer screening 02/18/2023   Acute gastroenteritis 12/07/2021   SIRS (systemic inflammatory response syndrome) (HCC) 12/06/2021   Pneumonia 12/06/2021   Syncopal episodes, suspect vasovagal 12/06/2021   Tachyarrhythmia 12/06/2021   History of cardiac arrest 12/06/2021   Vitamin D  deficiency 08/15/2021   B12 deficiency 08/14/2021   MDD (major depressive disorder), recurrent episode, severe (HCC) 08/13/2021   Schizoaffective disorder, depressive type (HCC) 08/09/2021   Cardiac arrest (HCC) 08/02/2021   Goals of care, counseling/discussion    History of pericarditis 06/23/2021   BMI 45.0-49.9, adult (HCC) 06/23/2021   Vision loss 06/22/2021   AKI (acute kidney injury) 06/22/2021   Pseudotumor cerebri 06/22/2021   At high risk for self harm 12/10/2020   Bradycardia 12/10/2020   DDD (degenerative disc disease), lumbar 12/10/2020   Heart murmur 12/10/2020   Migraine without aura and  without status migrainosus, not intractable 12/10/2020   Mild intermittent asthma without complication 12/10/2020   PTSD (post-traumatic stress disorder) 12/10/2020   PUD (peptic ulcer disease) 12/10/2020   Essential hypertension 08/01/2018   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/01/2018   Recurrent major depressive disorder, in partial remission 08/01/2018   Asthma 05/03/2017   Insomnia 05/03/2017   Spasm of back muscles 05/03/2017   Acute loss of vision, bilateral 12/04/2015   Chronic pain syndrome 12/25/2014   Acute contact dermatitis 09/11/2014   Bilateral low back pain with sciatica 09/11/2014   Chronic migraine without aura, intractable, without status migrainosus 09/11/2014   Pain in joint, lower leg 09/11/2014   Pneumonia of right upper lobe due to infectious organism 09/11/2014   Benign intracranial hypertension 09/11/2014   Anxiety 07/26/2014     ROS: Patient currently denies any vision changes, tinnitus, difficulty speaking, facial droop, sore throat, chest pain, shortness of breath, abdominal pain, nausea/vomiting/diarrhea, dysuria, or weakness/numbness/paresthesias in any extremity   Physical Exam   Triage Vital Signs: ED Triage Vitals  Encounter Vitals Group     BP 12/29/23 1534 (!) 109/56     Girls Systolic BP Percentile --      Girls Diastolic BP Percentile --      Boys Systolic BP Percentile --      Boys Diastolic BP Percentile --      Pulse Rate 12/29/23 1534 (!) 120     Resp 12/29/23 1534 18     Temp 12/29/23 1534 99.3 F (37.4 C)     Temp Source 12/29/23 1534  Oral     SpO2 12/29/23 1534 98 %     Weight 12/29/23 1531 260 lb (117.9 kg)     Height 12/29/23 1531 5' 3 (1.6 m)     Head Circumference --      Peak Flow --      Pain Score 12/29/23 1531 7     Pain Loc --      Pain Education --      Exclude from Growth Chart --     Most recent vital signs: Vitals:   12/29/23 1534 12/29/23 1931  BP: (!) 109/56 (!) 156/109  Pulse: (!) 120 (!) 110  Resp: 18 18  Temp:  99.3 F (37.4 C) 98 F (36.7 C)  SpO2: 98% 95%     Physical Exam Vitals and nursing note reviewed.  During triage patient was tachycardic.  Soft blood pressure  General:          Awake, no distress.  CV:                  Good peripheral perfusion.  Resp:               Normal effort.   no tachypnea Abd:                 No distention.  Soft nontender Other:              Right knee: Skin presence of 2 old scars in the lateral patellar area and prepatellar area.  Prepatellar effusion, tenderness to palpation in prepatellar area, decreased ability to extend knee.  Sensation severely decreased in the foot, sensation decreased in the calf.  Painful flexion.   ED Results / Procedures / Treatments   Labs (all labs ordered are listed, but only abnormal results are displayed) Labs Reviewed - No data to display    RADIOLOGY decreased ability to extend knee I independently reviewed and interpreted imaging and agree with radiologists findings.      PROCEDURES:  Critical Care performed:   Procedures   MEDICATIONS ORDERED IN ED: Medications  morphine  (PF) 4 MG/ML injection 4 mg (4 mg Intravenous Given 12/29/23 1706)  ondansetron  (ZOFRAN ) injection 4 mg (4 mg Intravenous Given 12/29/23 1706)  ketorolac  (TORADOL ) 15 MG/ML injection 15 mg (15 mg Intravenous Given 12/29/23 2045)   Clinical Course as of 12/29/23 2241  Wed Dec 29, 2023  1643 Consulted orthopedics Dr. Tobie, who is coming to see the patient, he advised to order MRI. [AE]  1655 Dr. Tobie evaluated the patient, did a physical exam.  Recommended MRI [AE]  1938 According to the nurse report patient continues with pain, I ordered Toradol  IV [AE]  2236 Dr. Tobie reviewed the MRI.  He advised to discharge patient.  No need of surgery at this point. [AE]    Clinical Course User Index [AE] Janit Kast, PA-C    IMPRESSION / MDM / ASSESSMENT AND PLAN / ED COURSE  I reviewed the triage vital signs and the nursing  notes.  Differential diagnosis includes, but is not limited to, quadriceps tendon tear, quadriceps tendon rupture, fracture, dislocation, tendinitis, soft tissue injury  Patient's presentation is most consistent with acute complicated illness / injury requiring diagnostic workup.   Audrey [name] is a 48 y.o., female who presents today after falling 2 days ago on flexion on her right knee.  Constant pain.  History of 4 surgeries in the right knee.  Physical exam presence of 2 eschars for previous surgery.  Prepatellar edema, tenderness to palpation at the level of the insertion of the quadriceps, full depression, patella is superior, flexion is tender, decreased extension.  Severe decreased  sensation in the right foot, decrease sensation in the right leg, sensation in the right leg is described as a tingling.  Plan Morphine  Zofran  Waiting for results of x-ray Consult orthopedic Patient's diagnosis is consistent with right knee soft tissue injury. I independently reviewed and interpreted imaging and agree with radiologists findings ruling out fracture.  At the moment of discharge MRI was not read, Dr. Tobie reviewed the images ruling out quadriceps tear. I did not order any labs. I did review the patient's allergies and medications.The patient is in stable and satisfactory condition for discharge home  Patient will be discharged home with prescriptions for naproxen , Flexeril. . Patient is to follow up with Dr. Tobie as needed or otherwise directed. Patient is given ED precautions to return to the ED for any worsening or new symptoms.  Did advise patient not to drive while taking Flexeril. Discussed plan of care with patient, answered all of patient's questions, Patient agreeable to plan of care. Advised patient to take medications according to the instructions on the label. Discussed possible side effects of new medications. Patient verbalized understanding.  FINAL CLINICAL IMPRESSION(S) / ED  DIAGNOSES   Final diagnoses:  Soft tissue injury of left knee, initial encounter     Rx / DC Orders   ED Discharge Orders          Ordered    cyclobenzaprine (FLEXERIL) 10 MG tablet  3 times daily PRN        12/29/23 2239    naproxen  (EC NAPROSYN ) 500 MG EC tablet  2 times daily with meals        12/29/23 2239             Note:  This document was prepared using Dragon voice recognition software and may include unintentional dictation errors.   Janit Kast, PA-C 12/29/23 2241    Janit Kast, PA-C 12/29/23 2242    Waymond Lorelle Cummins, MD 12/31/23 [phone]

## 2024-02-09 ENCOUNTER — Encounter: Payer: Self-pay | Admitting: Emergency Medicine

## 2024-02-09 ENCOUNTER — Other Ambulatory Visit: Payer: Self-pay

## 2024-02-09 ENCOUNTER — Emergency Department

## 2024-02-09 ENCOUNTER — Emergency Department
Admission: EM | Admit: 2024-02-09 | Discharge: 2024-02-09 | Disposition: A | Discharge status: Home / Self Care | Attending: Emergency Medicine | Admitting: Emergency Medicine

## 2024-02-09 DIAGNOSIS — S0501XA Injury of conjunctiva and corneal abrasion without foreign body, right eye, initial encounter: Secondary | ICD-10-CM | POA: Insufficient documentation

## 2024-02-09 DIAGNOSIS — R55 Syncope and collapse: Secondary | ICD-10-CM | POA: Insufficient documentation

## 2024-02-09 DIAGNOSIS — W1839XA Other fall on same level, initial encounter: Secondary | ICD-10-CM | POA: Insufficient documentation

## 2024-02-09 DIAGNOSIS — I10 Essential (primary) hypertension: Secondary | ICD-10-CM | POA: Insufficient documentation

## 2024-02-09 DIAGNOSIS — N390 Urinary tract infection, site not specified: Secondary | ICD-10-CM

## 2024-02-09 DIAGNOSIS — M25551 Pain in right hip: Secondary | ICD-10-CM | POA: Insufficient documentation

## 2024-02-09 LAB — URINALYSIS, ROUTINE W REFLEX MICROSCOPIC
Bilirubin Urine: NEGATIVE
Glucose, UA: NEGATIVE mg/dL
Hgb urine dipstick: NEGATIVE
Ketones, ur: NEGATIVE mg/dL
Nitrite: NEGATIVE
Protein, ur: 30 mg/dL — AB
Specific Gravity, Urine: 1.03 (ref 1.005–1.030)
pH: 5 (ref 5.0–8.0)

## 2024-02-09 LAB — COMPREHENSIVE METABOLIC PANEL WITH GFR
ALT: 11 U/L (ref 0–44)
AST: 23 U/L (ref 15–41)
Albumin: 3.5 g/dL (ref 3.5–5.0)
Alkaline Phosphatase: 119 U/L (ref 38–126)
Anion gap: 10 (ref 5–15)
BUN: 9 mg/dL (ref 6–20)
CO2: 27 mmol/L (ref 22–32)
Calcium: 9 mg/dL (ref 8.9–10.3)
Chloride: 104 mmol/L (ref 98–111)
Creatinine, Ser: 1.02 mg/dL — ABNORMAL HIGH (ref 0.44–1.00)
GFR, Estimated: >60 mL/min (ref >60)
Glucose, Bld: 101 mg/dL — ABNORMAL HIGH (ref 70–99)
Potassium: 4 mmol/L (ref 3.5–5.1)
Sodium: 142 mmol/L (ref 135–145)
Total Bilirubin: 0.4 mg/dL (ref 0.0–1.2)
Total Protein: 6.2 g/dL — ABNORMAL LOW (ref 6.5–8.1)

## 2024-02-09 LAB — CBC
HCT: 40.4 % (ref 36.0–46.0)
Hemoglobin: 12.8 g/dL (ref 12.0–15.0)
MCH: 29.2 pg (ref 26.0–34.0)
MCHC: 31.7 g/dL (ref 30.0–36.0)
MCV: 92.2 fL (ref 80.0–100.0)
Platelets: 209 K/uL (ref 150–400)
RBC: 4.38 MIL/uL (ref 3.87–5.11)
RDW: 14.1 % (ref 11.5–15.5)
WBC: 4.5 K/uL (ref 4.0–10.5)
nRBC: 0 % (ref 0.0–0.2)

## 2024-02-09 MED ORDER — CEFUROXIME AXETIL 250 MG PO TABS
250.0000 mg | ORAL_TABLET | Freq: Once | ORAL | Status: AC
  Administered 2024-02-09: 250 mg via ORAL
  Filled 2024-02-09: qty 1

## 2024-02-09 MED ORDER — TETRACAINE HCL 0.5 % OP SOLN
1.0000 [drp] | Freq: Once | OPHTHALMIC | Status: AC
  Administered 2024-02-09: 1 [drp] via OPHTHALMIC
  Filled 2024-02-09: qty 4

## 2024-02-09 MED ORDER — FLUORESCEIN SODIUM 1 MG OP STRP
1.0000 | ORAL_STRIP | Freq: Once | OPHTHALMIC | Status: AC
  Administered 2024-02-09: 1 via OPHTHALMIC
  Filled 2024-02-09: qty 1

## 2024-02-09 MED ORDER — LACTATED RINGERS IV BOLUS
1000.0000 mL | Freq: Once | INTRAVENOUS | Status: AC
  Administered 2024-02-09: 1000 mL via INTRAVENOUS

## 2024-02-09 MED ORDER — IBUPROFEN 600 MG PO TABS
600.0000 mg | ORAL_TABLET | Freq: Once | ORAL | Status: AC
  Administered 2024-02-09: 600 mg via ORAL
  Filled 2024-02-09: qty 1

## 2024-02-09 MED ORDER — CEFUROXIME AXETIL 250 MG PO TABS: 250.0000 mg | ORAL_TABLET | Freq: Two times a day (BID) | ORAL | 0 refills | Status: AC

## 2024-02-09 MED ORDER — ARTIFICIAL TEARS OPHTHALMIC OINT
TOPICAL_OINTMENT | Freq: Once | OPHTHALMIC | Status: AC
  Administered 2024-02-09: 1 via OPHTHALMIC
  Filled 2024-02-09: qty 3.5

## 2024-02-09 MED ORDER — HYDROCODONE-ACETAMINOPHEN 5-325 MG PO TABS
1.0000 | ORAL_TABLET | Freq: Once | ORAL | Status: AC
  Administered 2024-02-09: 1 via ORAL
  Filled 2024-02-09: qty 1

## 2024-02-09 MED ORDER — ERYTHROMYCIN 5 MG/GM OP OINT: 1.0000 | TOPICAL_OINTMENT | Freq: Every day | OPHTHALMIC | 0 refills | Status: AC

## 2024-02-09 NOTE — ED Notes (Signed)
 Pt given DC instructions. Pt verbalized understanding of medications and follow up care. Pt taken from Ed in wheelchair by this RN.

## 2024-02-09 NOTE — ED Provider Notes (Signed)
 Emergency department handoff note  Care of this patient was signed out to me at the end of the previous provider shift.  All pertinent patient information was conveyed and all questions were answered.  Patient pending laboratory and radiologic evaluation.  Patient's urinalysis was significant for moderate leukocytes with bacteria and cloudy color consistent with likely urinary tract infection.  Patient's x-ray of the pelvis did not show any evidence of acute abnormalities.  Patient was given 1 L of IV fluids prior to discharge given patient's mild hypotension.  Agrees with plan for discharge at this time with PCP follow-up as needed. Rx: Ceftin Dispo: Discharge home with PCP follow-up as needed   Keylon Labelle K, MD 02/09/24 1145

## 2024-02-09 NOTE — ED Provider Notes (Signed)
 [facility] Provider Note    Event Date/Time   First MD Initiated Contact with Patient 02/09/24 [phone]     (approximate)   History   Loss of Consciousness   HPI  Audrey [name] is a 48 year old female with history of schizoaffective disorder, hypertension, chronic pain presenting to the ER for evaluation of syncope.  Patient reports that this morning she got up and remembers waking up on the floor.  Since that pain she has had pain to her right hip.  Also feels like something might be in her eye or the eye might be scratched.  While in our lobby reports that her right leg felt weak causing her to fall onto the ground.  Denies hitting her head.  Reports pain in her right hip.  Reports history of frequent falls in the past.      Physical Exam   Triage Vital Signs: ED Triage Vitals  Encounter Vitals Group     BP 02/09/24 0615 124/73     Girls Systolic BP Percentile --      Girls Diastolic BP Percentile --      Boys Systolic BP Percentile --      Boys Diastolic BP Percentile --      Pulse Rate 02/09/24 0615 (!) 111     Resp 02/09/24 0615 (!) 22     Temp 02/09/24 0615 (!) 97.5 F (36.4 C)     Temp Source 02/09/24 0615 Oral     SpO2 02/09/24 0615 96 %     Weight 02/09/24 0612 260 lb (117.9 kg)     Height 02/09/24 0612 5' 3 (1.6 m)     Head Circumference --      Peak Flow --      Pain Score 02/09/24 0613 6     Pain Loc --      Pain Education --      Exclude from Growth Chart --     Most recent vital signs: Vitals:   02/09/24 0615 02/09/24 0635  BP: 124/73 107/86  Pulse: (!) 111 99  Resp: (!) 22 16  Temp: (!) 97.5 F (36.4 C)   SpO2: 96% 90%    Nursing notes and vital signs reviewed.  General: Adult female, lying in bed, awake and interactive Head: Atraumatic Eye: Patient holds her right eye shut, some conjunctival injection noted when she opens it.  Pupils equal and reactive.  Normal extraocular movements. Corneal abrasion noted over  right eye on fluorescein exam. Negative seidel sign. No foreign body noted in the eye.  Neck: No midline pain Chest: Symmetric chest rise, no tenderness to palpation.  Cardiac: Regular rhythm and rate.  Respiratory: Lungs clear to auscultation Abdomen: Soft, nondistended. No tenderness to palpation.  Pelvis: There is palpation along the right hip. MSK: No deformity to bilateral upper and lower extremity.  Able to lift right leg antigravity with 5 out of 5 strength, but does pain in her right hip when doing so.  Full range of motion of bilateral upper and left lower extremity. Alert, oriented. GCS 15.  Skin: No evidence of burns or lacerations.   ED Results / Procedures / Treatments   Labs (all labs ordered are listed, but only abnormal results are displayed) Labs Reviewed  CBC  COMPREHENSIVE METABOLIC PANEL WITH GFR  URINALYSIS, ROUTINE W REFLEX MICROSCOPIC     EKG EKG independently reviewed and interpreted by myself demonstrates:    RADIOLOGY Imaging independently reviewed and interpreted by myself demonstrates:  Formal Radiology Read:  No results found.  PROCEDURES:  Critical Care performed: No  Procedures   MEDICATIONS ORDERED IN ED: Medications  fluorescein ophthalmic strip 1 strip (1 strip Both Eyes Given 02/09/24 0644)  tetracaine (PONTOCAINE) 0.5 % ophthalmic solution 1 drop (1 drop Both Eyes Given 02/09/24 0644)  HYDROcodone -acetaminophen  (NORCO/VICODIN) 5-325 MG per tablet 1 tablet (1 tablet Oral Given 02/09/24 9361)     IMPRESSION / MDM / ASSESSMENT AND PLAN / ED COURSE  I reviewed the triage vital signs and the nursing notes.  Differential diagnosis includes, but is not limited to, corneal abrasion, eye foreign body, arrhythmia, anemia, electrolyte abnormality, hip fracture, dislocation, soft tissue injury  Patient's presentation is most consistent with acute presentation with potential threat to life or bodily function.  48 year old female  presenting following a syncopal episode with right hip pain, right eye discomfort.  Fluorescein exam is concerning for corneal abrasion. Does not wear contacts. Will plan to dc with erythromycin ointment. Labs, hip XR ordered, pending at time of signout.   Clinical Course as of 02/09/24 9295  Wed Feb 09, 2024  0703 Signed out to oncoming physician pending completion of workup and disposition. [NR]    Clinical Course User Index [NR] Levander Slate, MD     FINAL CLINICAL IMPRESSION(S) / ED DIAGNOSES   Final diagnoses:  Syncope and collapse  Injury of conjunctiva and corneal abrasion of right eye without foreign body, initial encounter  Right hip pain     Rx / DC Orders   ED Discharge Orders          Ordered    erythromycin ophthalmic ointment  Daily at bedtime        02/09/24 0655             Note:  This document was prepared using Dragon voice recognition software and may include unintentional dictation errors.   Levander Slate, MD 02/09/24 [phone]

## 2024-02-09 NOTE — ED Notes (Signed)
 Fall bundle in place and call light within reach.

## 2024-02-09 NOTE — ED Notes (Signed)
 Introduced self to pt. Pt alert and oriented x4. Pt complaining of pain 7/10 to right hip and right eye. Pt informed of need for urine sample. Pt has call bell in reach.

## 2024-02-09 NOTE — ED Triage Notes (Addendum)
 Pt ambulatory into lobby towards STAT desk, wearing slippers with fuzzy blanket wrapped around her hanging down to her feet and falls to the floor; pt able to stand up unassisted and sit in w/c; reports she got up to BR this morning and had syncopal episode; denies any recent illness and st when she aroused she had pain to her rt hip and feels as if she something in her rt eye; pt denies new c/o after falling to floor in lobby, only st my legs just gave out

## 2024-03-12 IMAGING — CR DG CHEST 2V
2 series · 2 of 2 positions shown · non-contrast
Comparison: Prior chest radiographs 08/05/2021 and earlier.

CLINICAL DATA: Provided history: CPR on arrival, chest pain, right
rib pain, unable to lift right arm without pain.

EXAM:
CHEST - 2 VIEW

[chest lat]
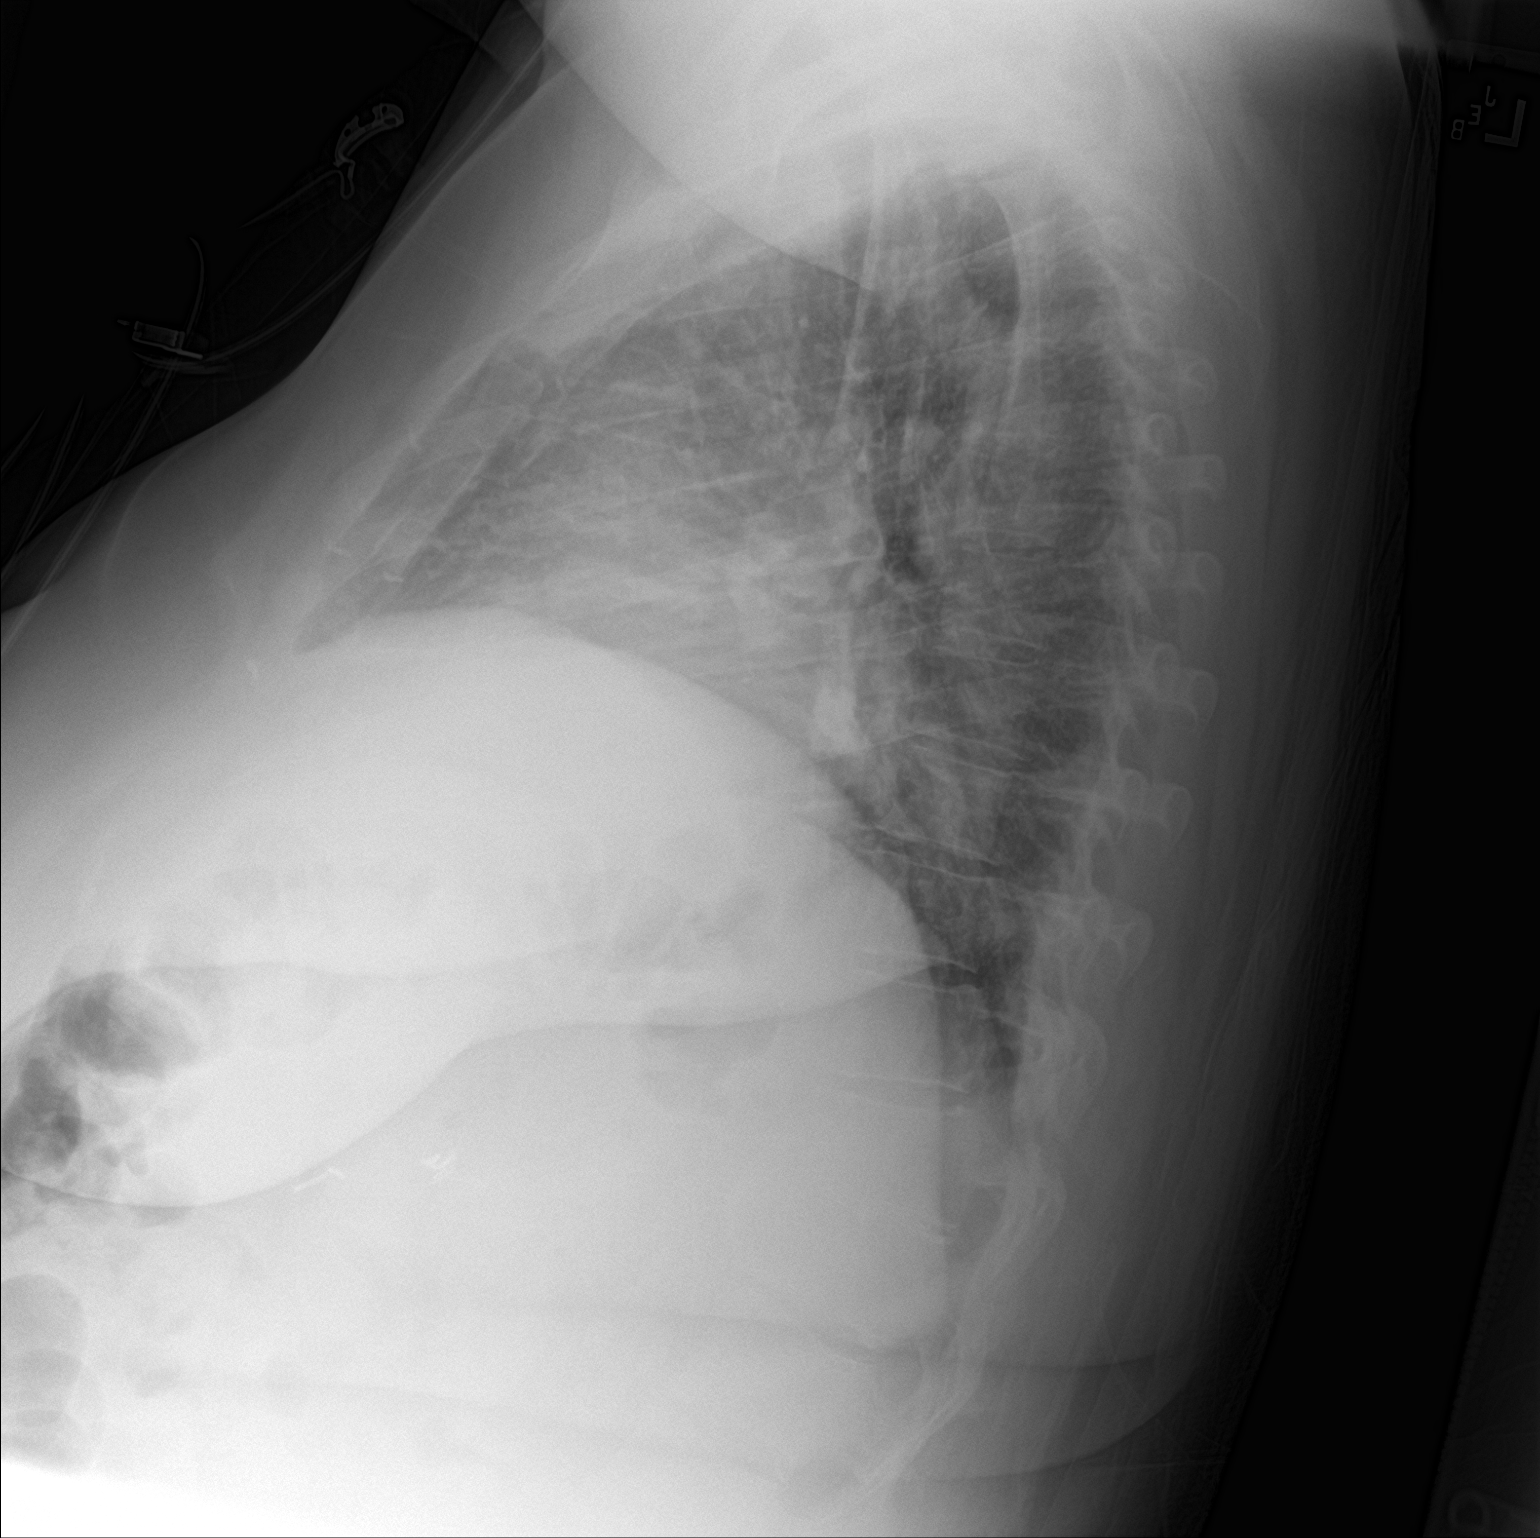

[chest ap]
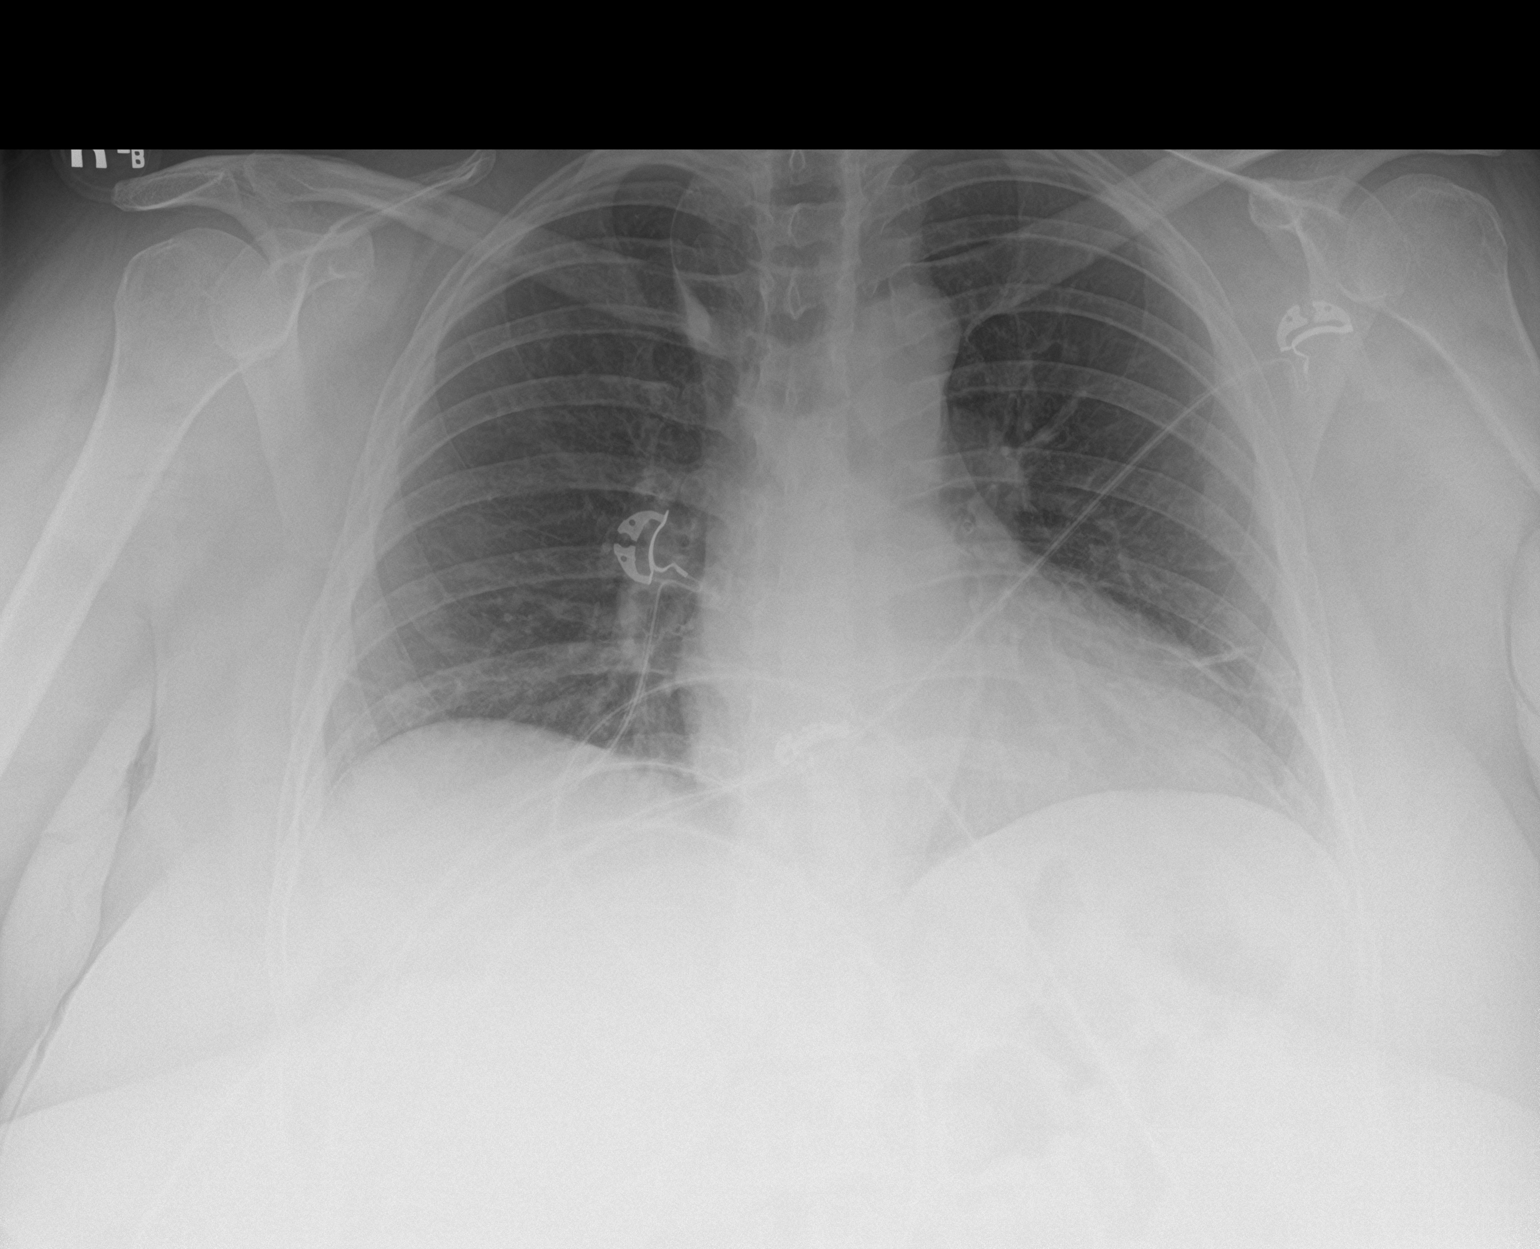

[2 of 2 positions shown; findings below may reference images not displayed]

FINDINGS: Interval extubation and removal of a previously demonstrated enteric
tube. Heart size within normal limits. Incidentally noted azygos
fissure. Aeration of the left lung base has significantly improved.
There is now only mild linear atelectasis within the bilateral lung
bases. No evidence of pleural effusion or pneumothorax. No acute
bony abnormality identified. Clips within surgical the upper
abdomen.
IMPRESSION: Interval extubation and removal of a previously demonstrated enteric
tube.

Mild linear atelectasis within the bilateral lung bases.
